# Patient Record
Sex: Male | Born: 1965
Health system: Southern US, Community
[De-identification: ages and names within clinical notes are randomized; demographics above are authoritative.]

## PROBLEM LIST (undated history)

## (undated) DIAGNOSIS — E119 Type 2 diabetes mellitus without complications: Secondary | ICD-10-CM

## (undated) DIAGNOSIS — J309 Allergic rhinitis, unspecified: Secondary | ICD-10-CM

## (undated) DIAGNOSIS — T7840XA Allergy, unspecified, initial encounter: Secondary | ICD-10-CM

## (undated) DIAGNOSIS — Z87442 Personal history of urinary calculi: Secondary | ICD-10-CM

## (undated) DIAGNOSIS — E785 Hyperlipidemia, unspecified: Secondary | ICD-10-CM

## (undated) DIAGNOSIS — M1990 Unspecified osteoarthritis, unspecified site: Secondary | ICD-10-CM

## (undated) HISTORY — DX: Allergic rhinitis, unspecified: J30.9

## (undated) HISTORY — PX: COLONOSCOPY: SHX174

## (undated) HISTORY — DX: Allergy, unspecified, initial encounter: T78.40XA

## (undated) HISTORY — PX: SHOULDER ARTHROSCOPY: SHX128

## (undated) HISTORY — DX: Hyperlipidemia, unspecified: E78.5

---

## 2003-02-06 ENCOUNTER — Emergency Department (HOSPITAL_COMMUNITY): Admission: EM | Admit: 2003-02-06 | Discharge: 2003-02-06 | Payer: Self-pay | Admitting: Emergency Medicine

## 2004-10-09 ENCOUNTER — Ambulatory Visit (HOSPITAL_COMMUNITY): Admission: RE | Admit: 2004-10-09 | Discharge: 2004-10-09 | Payer: Self-pay | Admitting: Family Medicine

## 2004-11-05 ENCOUNTER — Ambulatory Visit: Payer: Self-pay | Admitting: Orthopedic Surgery

## 2004-11-15 ENCOUNTER — Ambulatory Visit (HOSPITAL_COMMUNITY): Admission: RE | Admit: 2004-11-15 | Discharge: 2004-11-15 | Payer: Self-pay | Admitting: Orthopedic Surgery

## 2004-11-26 ENCOUNTER — Ambulatory Visit: Payer: Self-pay | Admitting: Orthopedic Surgery

## 2004-12-24 ENCOUNTER — Ambulatory Visit: Payer: Self-pay | Admitting: Orthopedic Surgery

## 2005-01-14 ENCOUNTER — Ambulatory Visit: Payer: Self-pay | Admitting: Orthopedic Surgery

## 2005-04-24 ENCOUNTER — Ambulatory Visit: Payer: Self-pay | Admitting: Orthopedic Surgery

## 2005-07-25 ENCOUNTER — Ambulatory Visit: Payer: Self-pay | Admitting: Orthopedic Surgery

## 2011-04-11 ENCOUNTER — Encounter: Payer: Self-pay | Admitting: Orthopedic Surgery

## 2011-04-11 ENCOUNTER — Ambulatory Visit (INDEPENDENT_AMBULATORY_CARE_PROVIDER_SITE_OTHER): Payer: BC Managed Care – PPO | Admitting: Orthopedic Surgery

## 2011-04-11 VITALS — BP 120/80 | Ht 74.0 in | Wt 254.0 lb

## 2011-04-11 DIAGNOSIS — M722 Plantar fascial fibromatosis: Secondary | ICD-10-CM

## 2011-04-11 MED ORDER — DICLOFENAC POTASSIUM 50 MG PO TABS: 50.0000 mg | ORAL_TABLET | Freq: Two times a day (BID) | ORAL | Status: DC

## 2011-04-11 NOTE — Progress Notes (Signed)
Chief complaint bilateral foot pain, LEFT greater than RIGHT.  Previous history of problems with his feet inserts.  That was over 5 years ago.  Now, has pain in the mid plantar fascia, not at the insertion. Complaint of sharp, dull, throbbing, burning, pain 6/10, which is constant, worse, after he's sat down and stood up. It's worse with standing and walking is better when he changes his shoes. He also has some pain along Baxter's nerve.  Review of systems weight gain, blurred vision, joint pain, and stiffness.  Past Surgical History  Procedure Date  . Shoulder arthroscopy     No past medical history on file.  Examination well-developed, well-nourished, male, grooming, and hygiene, are normal. He is oriented x3. His mood and affect are normal. His gait and station are unremarkable.  Bilateral foot. Examination reveals tenderness in the mid plantar fascial medial band, LEFT foot, fairly severe mild in the RIGHT foot. No pain at the insertion of either plantar fascia. Arches are normal. His Achilles tendon is nontender. No swelling in the bursa. Muscle tone normal in the foot and ankle. Skin intact. No rashes  Pulse and temperature are normal bilaterally. No sensory abnormalities.  LEFT foot along Baxter's nerve. There is some tenderness there as well.  Overall balance is normal.  X-rays LEFT foot.  Separate x-ray report.  Reason for x-ray for pain.  Findings. Contours are normal and there is a spur at the heel. There are no abnormalities seen. Otherwise.  Impression Normal foot exam.  Plan recommend anti-inflammatory and orthotics. New custom

## 2011-04-12 ENCOUNTER — Telehealth: Payer: Self-pay | Admitting: Orthopedic Surgery

## 2011-04-12 NOTE — Telephone Encounter (Signed)
Faxed order to [facility], ph # [phone], fax [phone], per Dr. Mort Sawyers recommendation, office visit 04/11/11. Patient will call them directly and has a copy of the order.

## 2011-04-17 ENCOUNTER — Other Ambulatory Visit: Payer: Self-pay | Admitting: *Deleted

## 2011-04-17 DIAGNOSIS — M722 Plantar fascial fibromatosis: Secondary | ICD-10-CM

## 2011-04-17 MED ORDER — DICLOFENAC POTASSIUM 50 MG PO TABS: 50.0000 mg | ORAL_TABLET | Freq: Two times a day (BID) | ORAL | Status: DC

## 2011-04-18 ENCOUNTER — Other Ambulatory Visit: Payer: Self-pay | Admitting: *Deleted

## 2011-04-18 ENCOUNTER — Telehealth: Payer: Self-pay | Admitting: Orthopedic Surgery

## 2011-04-18 MED ORDER — DICLOFENAC SODIUM 50 MG PO TBEC: 50.0000 mg | DELAYED_RELEASE_TABLET | Freq: Two times a day (BID) | ORAL | Status: AC

## 2011-04-18 NOTE — Telephone Encounter (Signed)
Call ask what is the issue

## 2011-04-18 NOTE — Telephone Encounter (Signed)
Med change was sent in

## 2011-04-18 NOTE — Telephone Encounter (Signed)
[name] needs you to call him about a prescription he received for Paul Barnes (Aug 31, 2065) His direct # [phone]

## 2011-04-18 NOTE — Telephone Encounter (Signed)
Mardelle Matte said the E scribe says Diclofenac 50 mg and in parentheses Cataflam 50 mg He asked if ok to use Voltaren

## 2012-10-06 ENCOUNTER — Other Ambulatory Visit: Payer: Self-pay | Admitting: Nurse Practitioner

## 2012-10-06 ENCOUNTER — Encounter: Payer: Self-pay | Admitting: *Deleted

## 2013-06-07 ENCOUNTER — Ambulatory Visit (INDEPENDENT_AMBULATORY_CARE_PROVIDER_SITE_OTHER): Payer: BC Managed Care – PPO | Admitting: Family Medicine

## 2013-06-07 ENCOUNTER — Encounter: Payer: Self-pay | Admitting: Family Medicine

## 2013-06-07 VITALS — BP 130/84 | Temp 101.1°F | Ht 74.0 in | Wt 258.0 lb

## 2013-06-07 DIAGNOSIS — J310 Chronic rhinitis: Secondary | ICD-10-CM

## 2013-06-07 DIAGNOSIS — J209 Acute bronchitis, unspecified: Secondary | ICD-10-CM

## 2013-06-07 DIAGNOSIS — J329 Chronic sinusitis, unspecified: Secondary | ICD-10-CM

## 2013-06-07 MED ORDER — ALBUTEROL SULFATE HFA 108 (90 BASE) MCG/ACT IN AERS: 2.0000 | INHALATION_SPRAY | Freq: Four times a day (QID) | RESPIRATORY_TRACT | Status: DC | PRN

## 2013-06-07 MED ORDER — LEVOFLOXACIN 500 MG PO TABS: 500.0000 mg | ORAL_TABLET | Freq: Every day | ORAL | Status: AC

## 2013-06-07 NOTE — Progress Notes (Signed)
   Subjective:    Patient ID: Paul Barnes, male    DOB: Mar 25, 1966, 48 y.o.   MRN: [id number]  Fever  This is a new problem. The current episode started in the past 7 days. The problem occurs constantly. The problem has been unchanged. The maximum temperature noted was 101 to 101.9 F. The temperature was taken using an oral thermometer. Associated symptoms include coughing, sleepiness and a sore throat. Treatments tried: sudafed. The treatment provided no relief.   Hx of possible flu in nov  Sore throat at times,  Cough deep and achey with everything  Headache mild in nature, Diminished energy  Ears sore scalp tingly   Review of Systems  Constitutional: Positive for fever.  HENT: Positive for sore throat.   Respiratory: Positive for cough.    no vomiting no diarrhea no rash ROS otherwise negative     Objective:   Physical Exam  Temp 101 moderate malaise. HEENT moderate nasal congestion frontal tenderness pharynx normal neck supple. Lungs clear heart regular in rhythm. Wheezy cough during exam.      Assessment & Plan:  Impression rhinosinusitis with fever possible flu element plan Levaquin daily 10 days. Symptomatic care discussed. Albuterol when necessary for wheezes and reactive airways. Symptomatic care discussed in this regard also. WSL

## 2013-06-08 ENCOUNTER — Telehealth: Payer: Self-pay | Admitting: Family Medicine

## 2013-06-08 MED ORDER — BENZONATATE 100 MG PO CAPS: 100.0000 mg | ORAL_CAPSULE | Freq: Four times a day (QID) | ORAL | Status: DC | PRN

## 2013-06-08 NOTE — Telephone Encounter (Signed)
Patient says that his cough is keeping him up at night and would like something for this called in to [facility]

## 2013-06-08 NOTE — Telephone Encounter (Signed)
Med sent and patient notified

## 2013-06-08 NOTE — Telephone Encounter (Signed)
Paul Barnes one q 6 prn cough

## 2013-06-11 ENCOUNTER — Telehealth: Payer: Self-pay | Admitting: Family Medicine

## 2013-06-11 NOTE — Telephone Encounter (Signed)
Notified patient it sounds like pt has FLU and sinus. Levaquin a good medicine. FLU will often make people feel horrible with resp congestion for 10 plus days. If wheezing sob or worse then may need to be seen here or er. Patient verbalized understanding.

## 2013-06-11 NOTE — Telephone Encounter (Signed)
Patient is still having cough, no fever, feeling clammy, congestion in chest, fatigue. He is wanting to know if he needs a different medication because he is not getting better.   [place]

## 2013-06-11 NOTE — Telephone Encounter (Signed)
It sounds like pt has FLU and sinus. Levaquin a good medicine. FLU will often make people feel horrible with resp congestion for 10 plus days. If wheezing sob or worse then may need to be seen here or er. Not improving form 1/5 not unusual

## 2013-06-23 DIAGNOSIS — Z0289 Encounter for other administrative examinations: Secondary | ICD-10-CM

## 2013-07-05 ENCOUNTER — Telehealth: Payer: Self-pay | Admitting: Family Medicine

## 2013-07-05 NOTE — Telephone Encounter (Signed)
Patient called on 1/31 about his disability papers that need faxing by the 4th of the month.He wanted to know if they are ready yet.

## 2013-07-05 NOTE — Telephone Encounter (Signed)
i did and gave to erica this morn

## 2013-09-15 ENCOUNTER — Encounter: Payer: Self-pay | Admitting: Family Medicine

## 2013-09-15 ENCOUNTER — Encounter: Payer: BC Managed Care – PPO | Admitting: Family Medicine

## 2013-09-17 ENCOUNTER — Encounter: Payer: Self-pay | Admitting: Family Medicine

## 2013-09-17 ENCOUNTER — Ambulatory Visit (INDEPENDENT_AMBULATORY_CARE_PROVIDER_SITE_OTHER): Payer: BC Managed Care – PPO | Admitting: Family Medicine

## 2013-09-17 VITALS — BP 118/80 | Temp 98.6°F | Ht 74.0 in | Wt 246.0 lb

## 2013-09-17 DIAGNOSIS — E119 Type 2 diabetes mellitus without complications: Secondary | ICD-10-CM

## 2013-09-17 DIAGNOSIS — N399 Disorder of urinary system, unspecified: Secondary | ICD-10-CM

## 2013-09-17 DIAGNOSIS — M545 Low back pain, unspecified: Secondary | ICD-10-CM

## 2013-09-17 DIAGNOSIS — R3989 Other symptoms and signs involving the genitourinary system: Secondary | ICD-10-CM

## 2013-09-17 LAB — POCT GLYCOSYLATED HEMOGLOBIN (HGB A1C): HEMOGLOBIN A1C: 12

## 2013-09-17 LAB — POCT URINALYSIS DIPSTICK
Glucose, UA: >1000
Spec Grav, UA: 1.025
pH, UA: 5

## 2013-09-17 LAB — GLUCOSE, POCT (MANUAL RESULT ENTRY): POC Glucose: 296 mg/dl — AB (ref 70–99)

## 2013-09-17 MED ORDER — ETODOLAC 400 MG PO TABS: 400.0000 mg | ORAL_TABLET | Freq: Two times a day (BID) | ORAL | Status: DC

## 2013-09-17 MED ORDER — METFORMIN HCL 500 MG PO TABS: ORAL_TABLET | ORAL | Status: DC

## 2013-09-17 NOTE — Progress Notes (Signed)
   Subjective:    Patient ID: Paul Barnes, male    DOB: 31-Jan-1966, 48 y.o.   MRN: [id number]  HPI Patient has been having lower back pain since Monday, on his right side.  No matter what position he is in, it is uncomfortable.  His fasting glucose is 296.  A1C is 12.0     Review of Systems     Objective:   Physical Exam        Assessment & Plan:

## 2013-09-17 NOTE — Progress Notes (Signed)
Subjective:    Patient ID: Paul Barnes, male    DOB: 18-Oct-1965, 48 y.o.   MRN: [id number]  HPI  Results for orders placed in visit on 09/17/13  POCT URINALYSIS DIPSTICK      Result Value Ref Range   Color, UA       Clarity, UA       Glucose, UA >1000     Bilirubin, UA ++     Ketones, UA       Spec Grav, UA 1.025     Blood, UA       pH, UA 5.0     Protein, UA       Urobilinogen, UA       Nitrite, UA       Leukocytes, UA       Results for orders placed in visit on 09/17/13  POCT URINALYSIS DIPSTICK      Result Value Ref Range   Color, UA       Clarity, UA       Glucose, UA >1000     Bilirubin, UA ++     Ketones, UA       Spec Grav, UA 1.025     Blood, UA       pH, UA 5.0     Protein, UA       Urobilinogen, UA       Nitrite, UA       Leukocytes, UA      GLUCOSE, POCT (MANUAL RESULT ENTRY)      Result Value Ref Range   POC Glucose 296 (*) 70 - 99 mg/dl  POCT GLYCOSYLATED HEMOGLOBIN (HGB A1C)      Result Value Ref Range   Hemoglobin A1C 12.0      mon morn got hit with a bad pain. At first he thought it might be a kidney stone. Primarily the right flank. Was worse with motion. Went to urgent care. Was noted to have fairly severe pain. They didn't urinalysis. He started him on Cipro for possible prostate infection. Of note there were more concerned that he had considerable amount of sugar in his urine. He was encouraged to get back with [place] ASAP.  Patient reports increased to sugar in diet lately. Increased urination. Increased thirst. Some nocturia. No particular dysuria.  Working hard not exercising as much as he had hoped.  Does admit to increased sugar and drinks and in diet.  Pain got worse and worse, and now somewhat better. No particular change with motion. No history of true pyelonephritis  Unable to reslve pain with over-the-counter agents. Review of Systems No nausea no vomiting no chest pain no abdominal pain no change in bowel habits some blurred  vision at times. ROS otherwise negative    Objective:   Physical Exam  Alert vitals stable. HEENT normal. Lungs clear. Heart regular rate and rhythm. Ankles without edema.  Possible right CVA tenderness minimal no spinal tenderness. Abdomen benign.    Results for orders placed in visit on 09/17/13  POCT URINALYSIS DIPSTICK      Result Value Ref Range   Color, UA       Clarity, UA       Glucose, UA >1000     Bilirubin, UA ++     Ketones, UA       Spec Grav, UA 1.025     Blood, UA       pH, UA 5.0     Protein, UA  Urobilinogen, UA       Nitrite, UA       Leukocytes, UA      GLUCOSE, POCT (MANUAL RESULT ENTRY)      Result Value Ref Range   POC Glucose 296 (*) 70 - 99 mg/dl  POCT GLYCOSYLATED HEMOGLOBIN (HGB A1C)      Result Value Ref Range   Hemoglobin A1C 12.0      Assessment & Plan:  Impression 1 new onset type 2 diabetes discussed at great length. Many questions answered. Prognosis management treatment diet exercise long-term complications implications etc. all discussed at great length. #2 flank pain possible pyelonephritis of gout. Plan maintain Cipro. Add Lodine when necessary for pain. Symptomatic care discussed. Diet discussed. Glucometer prescribed. Patient to start checking sugars once each day. Metformin 500 mg 1 by mouth twice a day for 2 days then 2 by mouth twice a day. Educational sheet for hospital given. Recheck in several weeks. 35-40 minutes spent most in discussion. Followup as scheduled. WSL

## 2013-09-18 DIAGNOSIS — E119 Type 2 diabetes mellitus without complications: Secondary | ICD-10-CM | POA: Insufficient documentation

## 2013-09-27 ENCOUNTER — Telehealth: Payer: Self-pay | Admitting: Family Medicine

## 2013-09-27 NOTE — Telephone Encounter (Signed)
Patient was seen on 09/17/13 and put on metformin. He is having side effects. He is feeling fatigued, dizzy and vision getting worse. Please advise. Also, blood sugar is down to 104 today.

## 2013-09-27 NOTE — Telephone Encounter (Signed)
Patient notified and verbalized understanding. 

## 2013-09-27 NOTE — Telephone Encounter (Signed)
Vision is getting worse because sugar is getting better, this is temporary. Knock back down to just one tab twice per d, work hard on diet, f u as sched

## 2013-09-30 DIAGNOSIS — Z0289 Encounter for other administrative examinations: Secondary | ICD-10-CM

## 2013-10-06 ENCOUNTER — Telehealth: Payer: Self-pay | Admitting: Family Medicine

## 2013-10-06 NOTE — Telephone Encounter (Signed)
Patient called to check on FMLA to see if ready, I explain not in office until Monday. He wanted you to add reocurring problem to his form.I wasn't sure where to right that. Please review form especially question 9 and 10 I wasn't sure what to write for them.

## 2013-10-11 ENCOUNTER — Ambulatory Visit: Payer: BC Managed Care – PPO | Admitting: Family Medicine

## 2013-10-18 ENCOUNTER — Encounter: Payer: Self-pay | Admitting: Family Medicine

## 2013-10-18 ENCOUNTER — Ambulatory Visit (INDEPENDENT_AMBULATORY_CARE_PROVIDER_SITE_OTHER): Payer: BC Managed Care – PPO | Admitting: Family Medicine

## 2013-10-18 VITALS — BP 118/82 | Ht 74.0 in | Wt 241.0 lb

## 2013-10-18 DIAGNOSIS — E119 Type 2 diabetes mellitus without complications: Secondary | ICD-10-CM

## 2013-10-18 NOTE — Progress Notes (Signed)
   Subjective:    Patient ID: Paul Barnes, male    DOB: 19-May-1966, 48 y.o.   MRN: [id number]  Diabetes He presents for his follow-up diabetic visit. He has type 2 diabetes mellitus. There are no hypoglycemic associated symptoms. Associated symptoms include blurred vision. Current diabetic treatment includes oral agent (monotherapy). He is compliant with treatment all of the time. He is following a generally healthy diet. Meal planning includes avoidance of concentrated sweets. He participates in exercise intermittently. He monitors blood glucose at home 1-2 x per day. His breakfast blood glucose range is generally 70-90 mg/dl. His overall blood glucose range is 110-130 mg/dl.    Pt would like an ongoing FMLA papers so that he can come for f/u visits and not get penalized at his job.   Back is still a little sore, but not painful like it was. Doing some walking. Had a big auction  Results for orders placed in visit on 09/17/13  POCT URINALYSIS DIPSTICK      Result Value Ref Range   Color, UA       Clarity, UA       Glucose, UA >1000     Bilirubin, UA ++     Ketones, UA       Spec Grav, UA 1.025     Blood, UA       pH, UA 5.0     Protein, UA       Urobilinogen, UA       Nitrite, UA       Leukocytes, UA      GLUCOSE, POCT (MANUAL RESULT ENTRY)      Result Value Ref Range   POC Glucose 296 (*) 70 - 99 mg/dl  POCT GLYCOSYLATED HEMOGLOBIN (HGB A1C)      Result Value Ref Range   Hemoglobin A1C 12.0     Most fasting glu's 70 to 80  Vision was transiently worse, used glasses for first time  Review of Systems  Eyes: Positive for blurred vision.   no chest pain. Back pain improving.     Objective:   Physical Exam  Alert no apparent distress. H&T normal. Lungs clear. Heart regular in rhythm. Ankles without edema.      Assessment & Plan:  Impression 1 type 2 diabetes control improving. Discussed. Plan followup in 3 months. Warning signs discussed. Blood work then. If stable  and improving we'll back off to every 6 months at that point. WSL

## 2013-11-03 ENCOUNTER — Telehealth: Payer: Self-pay | Admitting: Family Medicine

## 2013-11-03 NOTE — Telephone Encounter (Signed)
Patient was seen 5/18 for a visit and he called on 6/2 stating his job denied him coverage for that day Stating they need more detail on how many days per month that covers office visit on his followups and flareups.What you put on his form was not enough.Patient wants to know if you can put something in writing to cover this.

## 2013-11-03 NOTE — Telephone Encounter (Signed)
please advise pt it is not my fault that his company is being unreasonable, some companies are enormously resistant to providing time off for patients with chronic health concerns and his compoany clearly is one. Tell him that. Get me copy of FMLA and I will dictate a ltter to attach to it,

## 2013-11-16 ENCOUNTER — Ambulatory Visit (INDEPENDENT_AMBULATORY_CARE_PROVIDER_SITE_OTHER): Payer: BC Managed Care – PPO | Admitting: Family Medicine

## 2013-11-16 ENCOUNTER — Encounter: Payer: Self-pay | Admitting: Family Medicine

## 2013-11-16 VITALS — BP 138/90 | Ht 74.0 in | Wt 239.0 lb

## 2013-11-16 DIAGNOSIS — E119 Type 2 diabetes mellitus without complications: Secondary | ICD-10-CM

## 2013-11-16 NOTE — Progress Notes (Signed)
   Subjective:    Patient ID: Paul Barnes, male    DOB: Aug 27, 1965, 48 y.o.   MRN: [id number]  HPI Patient is here today to talk to you about his FMLA documents. Patient is working harder on his diet. He also reports compliance with medication.  Patient reports his vision has improved. Also less frequent urination.  No obvious side effects with medications.  No sugars in the low 100s.  No concerns/questions    Review of Systems No headache no chest pain no back pain no abdominal pain ROS otherwise negative    Objective:   Physical Exam   alert no apparent distress HEENT normal. Lungs clear. Heart regular in rhythm. Ankles without edema.      Assessment & Plan:  Impression type 2 diabetes control improved plan diet discussed. Compliance discussed exercise discussed. Recheck as scheduled. WSL we'll do A1c then

## 2013-11-17 ENCOUNTER — Encounter: Payer: Self-pay | Admitting: Family Medicine

## 2014-01-06 ENCOUNTER — Encounter: Payer: Self-pay | Admitting: Orthopedic Surgery

## 2014-01-06 ENCOUNTER — Ambulatory Visit (INDEPENDENT_AMBULATORY_CARE_PROVIDER_SITE_OTHER): Payer: BC Managed Care – PPO

## 2014-01-06 ENCOUNTER — Ambulatory Visit (INDEPENDENT_AMBULATORY_CARE_PROVIDER_SITE_OTHER): Payer: BC Managed Care – PPO | Admitting: Orthopedic Surgery

## 2014-01-06 VITALS — BP 138/88 | Ht 74.0 in | Wt 239.0 lb

## 2014-01-06 DIAGNOSIS — M25571 Pain in right ankle and joints of right foot: Secondary | ICD-10-CM

## 2014-01-06 DIAGNOSIS — M722 Plantar fascial fibromatosis: Secondary | ICD-10-CM

## 2014-01-06 DIAGNOSIS — M25579 Pain in unspecified ankle and joints of unspecified foot: Secondary | ICD-10-CM

## 2014-01-06 NOTE — Addendum Note (Signed)
Addended by: Carole Civil on: 01/06/2014 03:23 PM   Modules accepted: Level of Service

## 2014-01-06 NOTE — Progress Notes (Signed)
Chief Complaint  Patient presents with  . Foot Pain    Right foot pain, no injury.    48 years old recurrent pain right heel. The patient has had plantar fasciitis in the past was treated previously so we went ahead and started his exercise and anti-inflammatory program including cryotherapy presents with plantar heel pain no trauma or breath also complains of medial knee pain which we will address at another visit seems to have discomfort when his foot hits something in causes his leg or knee to twist  Review of systems otherwise normal  Past Medical History  Diagnosis Date  . Allergic rhinitis    Past Surgical History  Procedure Laterality Date  . Shoulder arthroscopy       Vital signs are stable as recorded  General appearance is normal, body habitus normal  The patient is alert and oriented x 3  The patient's mood and affect are normal  Gait assessment: Normal  The cardiovascular exam reveals normal pulses and temperature without edema or  swelling.  The lymphatic system is negative for palpable lymph nodes  The sensory exam is normal.  There are no pathologic reflexes.  Balance is normal.   Exam of the right foot dorsiflexion is normal Achilles tendon nontender tenderness at the plantar fascial insertion knee is stable muscle tone is normal no atrophy skin normal no rash    A/P X-rays normal structural bones in the foot with 2 spurs one on the Achilles insertion one at the plantar fascial insertion  Plantar fasciitis right foot  Inject right foot continue exercises ice and anti-inflammatory  Plantar Fasciitis (Heel Spur Syndrome)  Procedure injection plantar fascia  Verbal consent was obtained   Time out completed   The right  foot was injected  Under sterile conditions the plantar fascia  was injected with Depomedrol 40 mg / ml (1 ml) and lidocaine 1% (4 ml)  There were no complications

## 2014-01-06 NOTE — Patient Instructions (Signed)
You have received a steroid shot. 15% of patients experience increased pain at the injection site with in the next 24 hours. This is best treated with ice and tylenol extra strength 2 tabs every 8 hours. If you are still having pain please call the office.   Plantar Fasciitis Plantar fasciitis is a common condition that causes foot pain. It is soreness (inflammation) of the band of tough fibrous tissue on the bottom of the foot that runs from the heel bone (calcaneus) to the ball of the foot. The cause of this soreness may be from excessive standing, poor fitting shoes, running on hard surfaces, being overweight, having an abnormal walk, or overuse (this is common in runners) of the painful foot or feet. It is also common in aerobic exercise dancers and ballet dancers. SYMPTOMS  Most people with plantar fasciitis complain of:  Severe pain in the morning on the bottom of their foot especially when taking the first steps out of bed. This pain recedes after a few minutes of walking.  Severe pain is experienced also during walking following a long period of inactivity.  Pain is worse when walking barefoot or up stairs DIAGNOSIS   Your caregiver will diagnose this condition by examining and feeling your foot.  Special tests such as X-rays of your foot, are usually not needed. PREVENTION   Consult a sports medicine professional before beginning a new exercise program.  Walking programs offer a good workout. With walking there is a lower chance of overuse injuries common to runners. There is less impact and less jarring of the joints.  Begin all new exercise programs slowly. If problems or pain develop, decrease the amount of time or distance until you are at a comfortable level.  Wear good shoes and replace them regularly.  Stretch your foot and the heel cords at the back of the ankle (Achilles tendon) both before and after exercise.  Run or exercise on even surfaces that are not hard. For  example, asphalt is better than pavement.  Do not run barefoot on hard surfaces.  If using a treadmill, vary the incline.  Do not continue to workout if you have foot or joint problems. Seek professional help if they do not improve. HOME CARE INSTRUCTIONS   Avoid activities that cause you pain until you recover.  Use ice or cold packs on the problem or painful areas after working out.  Only take over-the-counter or prescription medicines for pain, discomfort, or fever as directed by your caregiver.  Soft shoe inserts or athletic shoes with air or gel sole cushions may be helpful.  If problems continue or become more severe, consult a sports medicine caregiver or your own health care provider. Cortisone is a potent anti-inflammatory medication that may be injected into the painful area. You can discuss this treatment with your caregiver. MAKE SURE YOU:   Understand these instructions.  Will watch your condition.  Will get help right away if you are not doing well or get worse. Document Released: 02/12/2001 Document Revised: 08/12/2011 Document Reviewed: 04/13/2008 [facility] Patient Information 2015 [place], [place]. This information is not intended to replace advice given to you by your health care provider. Make sure you discuss any questions you have with your health care provider.

## 2014-01-18 ENCOUNTER — Encounter: Payer: Self-pay | Admitting: Family Medicine

## 2014-01-18 ENCOUNTER — Ambulatory Visit (INDEPENDENT_AMBULATORY_CARE_PROVIDER_SITE_OTHER): Payer: BC Managed Care – PPO | Admitting: Family Medicine

## 2014-01-18 VITALS — BP 120/80 | Ht 75.0 in | Wt 238.0 lb

## 2014-01-18 DIAGNOSIS — Z0189 Encounter for other specified special examinations: Secondary | ICD-10-CM

## 2014-01-18 DIAGNOSIS — E119 Type 2 diabetes mellitus without complications: Secondary | ICD-10-CM

## 2014-01-18 DIAGNOSIS — M722 Plantar fascial fibromatosis: Secondary | ICD-10-CM | POA: Insufficient documentation

## 2014-01-18 DIAGNOSIS — Z79899 Other long term (current) drug therapy: Secondary | ICD-10-CM

## 2014-01-18 LAB — POCT GLYCOSYLATED HEMOGLOBIN (HGB A1C): HEMOGLOBIN A1C: 6.1

## 2014-01-18 MED ORDER — METFORMIN HCL 500 MG PO TABS: 500.0000 mg | ORAL_TABLET | Freq: Two times a day (BID) | ORAL | Status: DC

## 2014-01-18 NOTE — Progress Notes (Signed)
   Subjective:    Patient ID: Paul Barnes, male    DOB: Dec 14, 1965, 48 y.o.   MRN: [id number]  HPI Patient is learning to adjust his lifestyle change since begin diagnosed with diabetes. Patient states he is pleased with his #'s when checking blood sugar.  Patient still notes difficulty with blurred vision. However his sugars have been in tight control of late.  Exercising some not as much as he hoped.  Watching his diet well.  Ongoing challenges with heel pain. Left heel. Had a steroid injection. Helped a bit but not a lot. Due to see Dr. Aline Brochure back again soon.   Results for orders placed in visit on 01/18/14  POCT GLYCOSYLATED HEMOGLOBIN (HGB A1C)      Result Value Ref Range   Hemoglobin A1C 6.1        Morning numbers are generally good Review of Systems No chest pain no back pain no abdominal pain no change about the blood in stool ROS otherwise negative    Objective:   Physical Exam Alert no apparent distress vitals stable HEENT normal. Lungs clear. Heart rare rhythm. Ankles without edema. Feet C. diabetic foot exam Left heel tenderness palpation      Assessment & Plan:  Impression 1 type 2 diabetes control better and improved discussed at length #2 plantar fasciitis l tenderness palpation. Ongoing fasciitis discuss. Encouraged to get back with workup #3 history hyperlipidemia need to check blood work discussed plan diet exercise discussed maintain same meds check in 6 months encouraged to see eye Dr. 25 minutes spent most in discussion. WSL

## 2014-01-20 ENCOUNTER — Encounter (HOSPITAL_COMMUNITY): Payer: Self-pay | Admitting: Dietician

## 2014-01-20 NOTE — Progress Notes (Signed)
Paul Barnes left a voicemail on 01/18/14 to inquire about group DM classes. Returned call on 01/19/14 and left a voicemail; informed that services are offered on a limited basis due to transition of services being transferred to [place]. Offered 01/20/14 class at 1730 and informed that no additional classes will be held for August. Requested return call for registration, however never received a return call.

## 2014-02-01 ENCOUNTER — Telehealth: Payer: Self-pay | Admitting: Orthopedic Surgery

## 2014-02-01 ENCOUNTER — Ambulatory Visit (INDEPENDENT_AMBULATORY_CARE_PROVIDER_SITE_OTHER): Payer: BC Managed Care – PPO | Admitting: Orthopedic Surgery

## 2014-02-01 ENCOUNTER — Ambulatory Visit (INDEPENDENT_AMBULATORY_CARE_PROVIDER_SITE_OTHER): Payer: BC Managed Care – PPO

## 2014-02-01 ENCOUNTER — Encounter: Payer: Self-pay | Admitting: Orthopedic Surgery

## 2014-02-01 VITALS — BP 133/84 | Ht 75.0 in | Wt 238.0 lb

## 2014-02-01 DIAGNOSIS — M25569 Pain in unspecified knee: Secondary | ICD-10-CM

## 2014-02-01 DIAGNOSIS — M23321 Other meniscus derangements, posterior horn of medial meniscus, right knee: Secondary | ICD-10-CM

## 2014-02-01 DIAGNOSIS — M25561 Pain in right knee: Secondary | ICD-10-CM

## 2014-02-01 DIAGNOSIS — M23329 Other meniscus derangements, posterior horn of medial meniscus, unspecified knee: Secondary | ICD-10-CM

## 2014-02-01 NOTE — Telephone Encounter (Signed)
Patient called back about surgery, as discussed with Dr Aline Brochure at today's office visit; asked to have nurse return his call.  Phone#[phone] (Home)

## 2014-02-01 NOTE — Patient Instructions (Signed)
Call us back when ready to schedule

## 2014-02-01 NOTE — Telephone Encounter (Signed)
Decided on surgery for next Friday, can you write him a work note to be out all next week

## 2014-02-02 ENCOUNTER — Telehealth: Payer: Self-pay | Admitting: Orthopedic Surgery

## 2014-02-02 ENCOUNTER — Encounter (HOSPITAL_COMMUNITY): Payer: Self-pay | Admitting: Pharmacy Technician

## 2014-02-02 ENCOUNTER — Other Ambulatory Visit: Payer: Self-pay | Admitting: *Deleted

## 2014-02-02 NOTE — Telephone Encounter (Signed)
yes

## 2014-02-02 NOTE — Telephone Encounter (Signed)
Regarding out-patient surgery scheduled at [facility], 02/11/14, CPT 29881, contacted insurer, [employer], [phone]; per automated voice response system,  No pre-authorization is required. Ref# [id number], 02/02/14, 4:27p.m.

## 2014-02-03 ENCOUNTER — Encounter: Payer: Self-pay | Admitting: Orthopedic Surgery

## 2014-02-03 DIAGNOSIS — M23329 Other meniscus derangements, posterior horn of medial meniscus, unspecified knee: Secondary | ICD-10-CM | POA: Insufficient documentation

## 2014-02-03 NOTE — Progress Notes (Signed)
Chief Complaint  Patient presents with  . Knee Pain    right knee pain, no known injury   Established patient new problem  48 year old male presents with atraumatic onset of medial knee pain which she's had for a couple of months now. His pain is unrelieved by over-the-counter medication such as ibuprofen Aleve. Activity modification does not seem to make any improvement. He noticed some swelling in his knee as well. With certain motions such as twisting squatting kneeling bending the knee will give way.  Review of systems normal  Physical exam BP 133/84  Ht 6\' 3"  (1.905 m)  Wt 238 lb (107.956 kg)  BMI 29.75 kg/m2  Vital signs are stable as recorded  General appearance is normal, body habitus normal  The patient is alert and oriented x 3  The patient's mood and affect are normal  Gait assessment: There is no limp at this time lThe cardiovascular exam reveals normal pulses and temperature without edema or  swelling.  The lymphatic system is negative for palpable lymph nodes  The sensory exam is normal.  There are no pathologic reflexes.  Balance is normal.   left knee normal range of motion stability strength skin without rash lesion or laceration   Exam of the right knee  Inspection tenderness over the medial compartment small joint effusion Range of motion full but painful range of motion Stability all ligament stable Strength grade 5 motor strength  Skin normal, no rash, or laceration. Provocative tests McMurray sign, Apley sign and screw home test positive for meniscal tear  A/P X-rays negative  Presumptive diagnosis torn medial meniscus right knee.  Recommend surgical arthroscopy at the patient's convenience. Risks and benefits of procedure explained to the patient.  Patient also has plantar fasciitis and he will be out of work so hopefully the rest will help his feet. He's already received injection and orthotics.

## 2014-02-03 NOTE — Telephone Encounter (Signed)
Please advise and give work note

## 2014-02-03 NOTE — Telephone Encounter (Signed)
Work note done, notified patient ready for pickup.

## 2014-02-04 NOTE — Patient Instructions (Signed)
Paul Barnes  02/04/2014   Your procedure is scheduled on:  02/11/14  Report to [facility] at 6:15 AM.  Call this number if you have problems the morning of surgery: [phone]   Remember:   Do not eat food or drink liquids after midnight.   Take these medicines the morning of surgery with A SIP OF WATER: Flonase  Albuterol - DO NOT TAKE METFORMIN   Do not wear jewelry, make-up or nail polish.  Do not wear lotions, powders, or perfumes. You may wear deodorant.  Do not shave 48 hours prior to surgery. Men may shave face and neck.  Do not bring valuables to the hospital.  [facility] is not responsible                  for any belongings or valuables.               Contacts, dentures or bridgework may not be worn into surgery.  Leave suitcase in the car. After surgery it may be brought to your room.  For patients admitted to the hospital, discharge time is determined by your treatment team.               Patients discharged the day of surgery will not be allowed to drive home.  Name and phone number of your driver:   Special Instructions: Shower using CHG 2 nights before surgery and the night before surgery.  If you shower the day of surgery use CHG.  Use special wash - you have one bottle of CHG for all showers.  You should use approximately 1/3 of the bottle for each shower.   Please read over the following fact sheets that you were given: Surgical Site Infection Prevention and Anesthesia Post-op Instructions   PATIENT INSTRUCTIONS POST-ANESTHESIA  IMMEDIATELY FOLLOWING SURGERY:  Do not drive or operate machinery for the first twenty four hours after surgery.  Do not make any important decisions for twenty four hours after surgery or while taking narcotic pain medications or sedatives.  If you develop intractable nausea and vomiting or a severe headache please notify your doctor immediately.  FOLLOW-UP:  Please make an appointment with your surgeon as instructed. You do not need  to follow up with anesthesia unless specifically instructed to do so.  WOUND CARE INSTRUCTIONS (if applicable):  Keep a dry clean dressing on the anesthesia/puncture wound site if there is drainage.  Once the wound has quit draining you may leave it open to air.  Generally you should leave the bandage intact for twenty four hours unless there is drainage.  If the epidural site drains for more than 36-48 hours please call the anesthesia department.  QUESTIONS?:  Please feel free to call your physician or the hospital operator if you have any questions, and they will be happy to assist you.      Arthroscopic Procedure, Knee An arthroscopic procedure can find what is wrong with your knee. PROCEDURE Arthroscopy is a surgical technique that allows your orthopedic surgeon to diagnose and treat your knee injury with accuracy. They will look into your knee through a small instrument. This is almost like a small (pencil sized) telescope. Because arthroscopy affects your knee less than open knee surgery, you can anticipate a more rapid recovery. Taking an active role by following your caregiver's instructions will help with rapid and complete recovery. Use crutches, rest, elevation, ice, and knee exercises as instructed. The length of recovery depends on various factors including  type of injury, age, physical condition, medical conditions, and your rehabilitation. Your knee is the joint between the large bones (femur and tibia) in your leg. Cartilage covers these bone ends which are smooth and slippery and allow your knee to bend and move smoothly. Two menisci, thick, semi-lunar shaped pads of cartilage which form a rim inside the joint, help absorb shock and stabilize your knee. Ligaments bind the bones together and support your knee joint. Muscles move the joint, help support your knee, and take stress off the joint itself. Because of this all programs and physical therapy to rehabilitate an injured or repaired  knee require rebuilding and strengthening your muscles. AFTER THE PROCEDURE  After the procedure, you will be moved to a recovery area until most of the effects of the medication have worn off. Your caregiver will discuss the test results with you.  Only take over-the-counter or prescription medicines for pain, discomfort, or fever as directed by your caregiver. SEEK MEDICAL CARE IF:   You have increased bleeding from your wounds.  You see redness, swelling, or have increasing pain in your wounds.  You have pus coming from your wound.  You have an oral temperature above 102 F (38.9 C).  You notice a bad smell coming from the wound or dressing.  You have severe pain with any motion of your knee. SEEK IMMEDIATE MEDICAL CARE IF:   You develop a rash.  You have difficulty breathing.  You have any allergic problems. Document Released: 05/17/2000 Document Revised: 08/12/2011 Document Reviewed: 12/09/2007 [facility] Patient Information 2015 [place], [place]. This information is not intended to replace advice given to you by your health care provider. Make sure you discuss any questions you have with your health care provider.

## 2014-02-08 ENCOUNTER — Encounter (HOSPITAL_COMMUNITY)
Admission: RE | Admit: 2014-02-08 | Discharge: 2014-02-08 | Disposition: A | Discharge status: Home / Self Care | Payer: BC Managed Care – PPO | Source: Ambulatory Visit | Attending: Orthopedic Surgery | Admitting: Orthopedic Surgery

## 2014-02-08 ENCOUNTER — Encounter (HOSPITAL_COMMUNITY): Payer: Self-pay

## 2014-02-08 DIAGNOSIS — M2240 Chondromalacia patellae, unspecified knee: Secondary | ICD-10-CM | POA: Diagnosis not present

## 2014-02-08 DIAGNOSIS — E119 Type 2 diabetes mellitus without complications: Secondary | ICD-10-CM | POA: Diagnosis not present

## 2014-02-08 DIAGNOSIS — M23329 Other meniscus derangements, posterior horn of medial meniscus, unspecified knee: Secondary | ICD-10-CM | POA: Diagnosis present

## 2014-02-08 DIAGNOSIS — M1710 Unilateral primary osteoarthritis, unspecified knee: Secondary | ICD-10-CM | POA: Diagnosis not present

## 2014-02-08 HISTORY — DX: Type 2 diabetes mellitus without complications: E11.9

## 2014-02-08 LAB — COMPREHENSIVE METABOLIC PANEL
ALT: 13 U/L (ref 0–53)
ANION GAP: 10 (ref 5–15)
AST: 16 U/L (ref 0–37)
Albumin: 4.2 g/dL (ref 3.5–5.2)
Alkaline Phosphatase: 70 U/L (ref 39–117)
BUN: 17 mg/dL (ref 6–23)
CHLORIDE: 104 meq/L (ref 96–112)
CO2: 27 mEq/L (ref 19–32)
CREATININE: 1.17 mg/dL (ref 0.50–1.35)
Calcium: 9.7 mg/dL (ref 8.4–10.5)
GFR, EST AFRICAN AMERICAN: 83 mL/min — AB (ref >90)
GFR, EST NON AFRICAN AMERICAN: 72 mL/min — AB (ref >90)
GLUCOSE: 145 mg/dL — AB (ref 70–99)
Potassium: 4.2 mEq/L (ref 3.7–5.3)
SODIUM: 141 meq/L (ref 137–147)
Total Bilirubin: 1.1 mg/dL (ref 0.3–1.2)
Total Protein: 7 g/dL (ref 6.0–8.3)

## 2014-02-08 LAB — CBC
HEMATOCRIT: 44.5 % (ref 39.0–52.0)
HEMOGLOBIN: 15.6 g/dL (ref 13.0–17.0)
MCH: 31.9 pg (ref 26.0–34.0)
MCHC: 35.1 g/dL (ref 30.0–36.0)
MCV: 91 fL (ref 78.0–100.0)
Platelets: 214 10*3/uL (ref 150–400)
RBC: 4.89 MIL/uL (ref 4.22–5.81)
RDW: 12.5 % (ref 11.5–15.5)
WBC: 5.6 10*3/uL (ref 4.0–10.5)

## 2014-02-08 NOTE — Pre-Procedure Instructions (Signed)
Patient given information to sign up for my chart at home. 

## 2014-02-10 NOTE — H&P (Signed)
Paul Barnes is an 48 y.o. male.   Chief Complaint   Patient presents with   .  Knee Pain       right knee pain, no known injury    Established patient new problem  48 year old male presents with atraumatic onset of medial knee pain which she's had for a couple of months now. His pain is unrelieved by over-the-counter medication such as ibuprofen Aleve. Activity modification does not seem to make any improvement. He noticed some swelling in his knee as well. With certain motions such as twisting squatting kneeling bending the knee will give way.  Review of systems normal  Physical exam BP 133/84  Ht 6\' 3"  (1.905 m)  Wt 238 lb (107.956 kg)  BMI 29.75 kg/m2  Vital signs are stable as recorded  General appearance is normal, body habitus normal  The patient is alert and oriented x 3  The patient's mood and affect are normal  Gait assessment: There is no limp at this time lThe cardiovascular exam reveals normal pulses and temperature without edema or  swelling.  The lymphatic system is negative for palpable lymph nodes  The sensory exam is normal.  There are no pathologic reflexes.  Balance is normal.     left knee normal range of motion stability strength skin without rash lesion or laceration   Exam of the right knee  Inspection tenderness over the medial compartment small joint effusion Range of motion full but painful range of motion Stability all ligament stable Strength grade 5 motor strength   Skin normal, no rash, or laceration. Provocative tests McMurray sign, Apley sign and screw home test positive for meniscal tear  A/P X-rays negative  Presumptive diagnosis torn medial meniscus right knee.  Recommend surgical arthroscopy right knee partial medial meniscectomy at the patient's convenience. Risks and benefits of procedure explained to the patient.          Past Medical History  Diagnosis Date  . Allergic rhinitis   . Diabetes mellitus without  complication     Past Surgical History  Procedure Laterality Date  . Shoulder arthroscopy Right     Family History  Problem Relation Age of Onset  . Heart disease    . Cancer    . Diabetes     Social History:  reports that he has never smoked. He does not have any smokeless tobacco history on file. He reports that he drinks alcohol. He reports that he does not use illicit drugs.  Allergies: No Known Allergies  No prescriptions prior to admission    No results found for this or any previous visit (from the past 48 hour(s)). No results found.  ROS  There were no vitals taken for this visit. Physical Exam   Assessment/Plan Torn medial meniscus right knee  Arthroscopy right knee partial medial meniscectomy  Arther Abbott 02/10/2014, 2:57 PM

## 2014-02-11 ENCOUNTER — Encounter (HOSPITAL_COMMUNITY)
Admission: RE | Disposition: A | Discharge status: Home / Self Care | Payer: Self-pay | Source: Ambulatory Visit | Attending: Orthopedic Surgery

## 2014-02-11 ENCOUNTER — Encounter (HOSPITAL_COMMUNITY): Payer: BC Managed Care – PPO | Admitting: Anesthesiology

## 2014-02-11 ENCOUNTER — Ambulatory Visit (HOSPITAL_COMMUNITY): Payer: BC Managed Care – PPO | Admitting: Anesthesiology

## 2014-02-11 ENCOUNTER — Ambulatory Visit (HOSPITAL_COMMUNITY)
Admission: RE | Admit: 2014-02-11 | Discharge: 2014-02-11 | Disposition: A | Discharge status: Home / Self Care | Payer: BC Managed Care – PPO | Source: Ambulatory Visit | Attending: Orthopedic Surgery | Admitting: Orthopedic Surgery

## 2014-02-11 ENCOUNTER — Encounter (HOSPITAL_COMMUNITY): Payer: Self-pay | Admitting: *Deleted

## 2014-02-11 DIAGNOSIS — M23329 Other meniscus derangements, posterior horn of medial meniscus, unspecified knee: Secondary | ICD-10-CM | POA: Diagnosis not present

## 2014-02-11 DIAGNOSIS — M2240 Chondromalacia patellae, unspecified knee: Secondary | ICD-10-CM | POA: Insufficient documentation

## 2014-02-11 DIAGNOSIS — M23321 Other meniscus derangements, posterior horn of medial meniscus, right knee: Secondary | ICD-10-CM

## 2014-02-11 DIAGNOSIS — E119 Type 2 diabetes mellitus without complications: Secondary | ICD-10-CM | POA: Insufficient documentation

## 2014-02-11 DIAGNOSIS — M1710 Unilateral primary osteoarthritis, unspecified knee: Secondary | ICD-10-CM | POA: Insufficient documentation

## 2014-02-11 HISTORY — PX: KNEE ARTHROSCOPY WITH MEDIAL MENISECTOMY: SHX5651

## 2014-02-11 LAB — GLUCOSE, CAPILLARY: Glucose-Capillary: 137 mg/dL — ABNORMAL HIGH (ref 70–99)

## 2014-02-11 SURGERY — ARTHROSCOPY, KNEE, WITH MEDIAL MENISCECTOMY
Anesthesia: General | Laterality: Right

## 2014-02-11 MED ORDER — SODIUM CHLORIDE 0.9 % IR SOLN
Status: DC | PRN
  Administered 2014-02-11 (×2)

## 2014-02-11 MED ORDER — PROMETHAZINE HCL 12.5 MG PO TABS: 12.5000 mg | ORAL_TABLET | Freq: Four times a day (QID) | ORAL | Status: DC | PRN

## 2014-02-11 MED ORDER — FENTANYL CITRATE 0.05 MG/ML IJ SOLN
25.0000 ug | INTRAMUSCULAR | Status: AC
  Administered 2014-02-11 (×2): 25 ug via INTRAVENOUS
  Filled 2014-02-11: qty 2

## 2014-02-11 MED ORDER — ONDANSETRON HCL 4 MG/2ML IJ SOLN
4.0000 mg | Freq: Once | INTRAMUSCULAR | Status: AC | PRN
  Administered 2014-02-11: 4 mg via INTRAVENOUS

## 2014-02-11 MED ORDER — ONDANSETRON HCL 4 MG/2ML IJ SOLN
4.0000 mg | Freq: Once | INTRAMUSCULAR | Status: AC
  Administered 2014-02-11: 4 mg via INTRAVENOUS
  Filled 2014-02-11: qty 2

## 2014-02-11 MED ORDER — LIDOCAINE HCL 1 % IJ SOLN
INTRAMUSCULAR | Status: DC | PRN
  Administered 2014-02-11: 40 mg via INTRADERMAL

## 2014-02-11 MED ORDER — BUPIVACAINE-EPINEPHRINE (PF) 0.5% -1:200000 IJ SOLN
INTRAMUSCULAR | Status: AC
  Filled 2014-02-11: qty 60

## 2014-02-11 MED ORDER — DEXAMETHASONE SODIUM PHOSPHATE 4 MG/ML IJ SOLN
4.0000 mg | Freq: Once | INTRAMUSCULAR | Status: AC
  Administered 2014-02-11: 4 mg via INTRAVENOUS
  Filled 2014-02-11: qty 1

## 2014-02-11 MED ORDER — BUPIVACAINE-EPINEPHRINE (PF) 0.5% -1:200000 IJ SOLN
INTRAMUSCULAR | Status: DC | PRN
  Administered 2014-02-11: 60 mL

## 2014-02-11 MED ORDER — FENTANYL CITRATE 0.05 MG/ML IJ SOLN
INTRAMUSCULAR | Status: DC | PRN
  Administered 2014-02-11 (×2): 25 ug via INTRAVENOUS
  Administered 2014-02-11: 50 ug via INTRAVENOUS

## 2014-02-11 MED ORDER — PROPOFOL 10 MG/ML IV BOLUS
INTRAVENOUS | Status: DC | PRN
  Administered 2014-02-11: 170 mg via INTRAVENOUS

## 2014-02-11 MED ORDER — FENTANYL CITRATE 0.05 MG/ML IJ SOLN
INTRAMUSCULAR | Status: AC
  Filled 2014-02-11: qty 5

## 2014-02-11 MED ORDER — CHLORHEXIDINE GLUCONATE 4 % EX LIQD: 60.0000 mL | Freq: Once | CUTANEOUS | Status: DC

## 2014-02-11 MED ORDER — HYDROCODONE-ACETAMINOPHEN 5-325 MG PO TABS: 1.0000 | ORAL_TABLET | Freq: Four times a day (QID) | ORAL | Status: DC | PRN

## 2014-02-11 MED ORDER — PROPOFOL 10 MG/ML IV BOLUS
INTRAVENOUS | Status: AC
  Filled 2014-02-11: qty 20

## 2014-02-11 MED ORDER — FENTANYL CITRATE 0.05 MG/ML IJ SOLN
INTRAMUSCULAR | Status: AC
  Filled 2014-02-11: qty 2

## 2014-02-11 MED ORDER — FENTANYL CITRATE 0.05 MG/ML IJ SOLN
25.0000 ug | INTRAMUSCULAR | Status: DC | PRN
  Administered 2014-02-11 (×2): 50 ug via INTRAVENOUS

## 2014-02-11 MED ORDER — ONDANSETRON HCL 4 MG/2ML IJ SOLN
INTRAMUSCULAR | Status: AC
  Filled 2014-02-11: qty 2

## 2014-02-11 MED ORDER — LIDOCAINE HCL (PF) 1 % IJ SOLN
INTRAMUSCULAR | Status: AC
  Filled 2014-02-11: qty 5

## 2014-02-11 MED ORDER — EPINEPHRINE HCL 1 MG/ML IJ SOLN
INTRAMUSCULAR | Status: AC
  Filled 2014-02-11: qty 4

## 2014-02-11 MED ORDER — LACTATED RINGERS IV SOLN
INTRAVENOUS | Status: DC
Start: 2014-02-11 — End: 2014-02-11
  Administered 2014-02-11: 07:00:00 via INTRAVENOUS

## 2014-02-11 MED ORDER — CEFAZOLIN SODIUM-DEXTROSE 2-3 GM-% IV SOLR
2.0000 g | INTRAVENOUS | Status: DC
  Administered 2014-02-11: 2 g via INTRAVENOUS
  Filled 2014-02-11: qty 50

## 2014-02-11 MED ORDER — MIDAZOLAM HCL 2 MG/2ML IJ SOLN
1.0000 mg | INTRAMUSCULAR | Status: DC | PRN
  Administered 2014-02-11: 2 mg via INTRAVENOUS
  Filled 2014-02-11: qty 2

## 2014-02-11 SURGICAL SUPPLY — 49 items
ARTHROWAND PARAGON T2 (SURGICAL WAND) ×2
BAG HAMPER (MISCELLANEOUS) ×2 IMPLANT
BANDAGE ELASTIC 6 VELCRO NS (GAUZE/BANDAGES/DRESSINGS) ×2 IMPLANT
BLADE 11 SAFETY STRL DISP (BLADE) ×2 IMPLANT
BLADE AGGRESSIVE PLUS 4.0 (BLADE) ×2 IMPLANT
CHLORAPREP W/TINT 26ML (MISCELLANEOUS) ×2 IMPLANT
CLOTH BEACON ORANGE TIMEOUT ST (SAFETY) ×2 IMPLANT
COOLER CRYO IC GRAV AND TUBE (ORTHOPEDIC SUPPLIES) ×2 IMPLANT
CUFF CRYO KNEE LG 20X31 COOLER (ORTHOPEDIC SUPPLIES) IMPLANT
CUFF CRYO KNEE18X23 MED (MISCELLANEOUS) ×2 IMPLANT
CUFF TOURNIQUET SINGLE 34IN LL (TOURNIQUET CUFF) IMPLANT
CUFF TOURNIQUET SINGLE 44IN (TOURNIQUET CUFF) IMPLANT
CUTTER ANGLED DBL BITE 4.5 (BURR) IMPLANT
DECANTER SPIKE VIAL GLASS SM (MISCELLANEOUS) ×4 IMPLANT
GAUZE SPONGE 4X4 12PLY STRL (GAUZE/BANDAGES/DRESSINGS) IMPLANT
GAUZE SPONGE 4X4 16PLY XRAY LF (GAUZE/BANDAGES/DRESSINGS) ×2 IMPLANT
GAUZE XEROFORM 5X9 LF (GAUZE/BANDAGES/DRESSINGS) ×2 IMPLANT
GLOVE SKINSENSE NS SZ8.0 LF (GLOVE) ×1
GLOVE SKINSENSE STRL SZ8.0 LF (GLOVE) ×1 IMPLANT
GLOVE SS N UNI LF 8.5 STRL (GLOVE) ×2 IMPLANT
GOWN STRL REUS W/TWL LRG LVL3 (GOWN DISPOSABLE) ×2 IMPLANT
GOWN STRL REUS W/TWL XL LVL3 (GOWN DISPOSABLE) ×2 IMPLANT
HLDR LEG FOAM (MISCELLANEOUS) ×1 IMPLANT
IV NS IRRIG 3000ML ARTHROMATIC (IV SOLUTION) ×6 IMPLANT
KIT BLADEGUARD II DBL (SET/KITS/TRAYS/PACK) ×2 IMPLANT
KIT ROOM TURNOVER AP CYSTO (KITS) ×2 IMPLANT
LEG HOLDER FOAM (MISCELLANEOUS) ×1
MANIFOLD NEPTUNE II (INSTRUMENTS) ×2 IMPLANT
MARKER SKIN DUAL TIP RULER LAB (MISCELLANEOUS) ×2 IMPLANT
NEEDLE HYPO 18GX1.5 BLUNT FILL (NEEDLE) IMPLANT
NEEDLE HYPO 21X1.5 SAFETY (NEEDLE) ×2 IMPLANT
NEEDLE SPNL 18GX3.5 QUINCKE PK (NEEDLE) ×2 IMPLANT
NS IRRIG 1000ML POUR BTL (IV SOLUTION) ×2 IMPLANT
PACK ARTHRO LIMB DRAPE STRL (MISCELLANEOUS) ×2 IMPLANT
PAD ABD 5X9 TENDERSORB (GAUZE/BANDAGES/DRESSINGS) ×2 IMPLANT
PAD ARMBOARD 7.5X6 YLW CONV (MISCELLANEOUS) ×2 IMPLANT
PADDING CAST COTTON 6X4 STRL (CAST SUPPLIES) IMPLANT
PADDING WEBRIL 6 STERILE (GAUZE/BANDAGES/DRESSINGS) ×2 IMPLANT
SET ARTHROSCOPY INST (INSTRUMENTS) ×2 IMPLANT
SET ARTHROSCOPY PUMP TUBE (IRRIGATION / IRRIGATOR) ×2 IMPLANT
SET BASIN LINEN APH (SET/KITS/TRAYS/PACK) ×2 IMPLANT
SPONGE GAUZE 4X4 12PLY (GAUZE/BANDAGES/DRESSINGS) ×2 IMPLANT
SUT ETHILON 3 0 FSL (SUTURE) ×2 IMPLANT
SYR 30ML LL (SYRINGE) ×2 IMPLANT
SYRINGE 12CC LL (MISCELLANEOUS) ×2 IMPLANT
WAND 50 DEG COVAC W/CORD (SURGICAL WAND) IMPLANT
WAND 90 DEG TURBOVAC W/CORD (SURGICAL WAND) ×2 IMPLANT
WAND ARTHRO PARAGON T2 (SURGICAL WAND) ×1 IMPLANT
YANKAUER SUCT BULB TIP 10FT TU (MISCELLANEOUS) ×6 IMPLANT

## 2014-02-11 NOTE — Discharge Instructions (Signed)
Ankle Arthroscopy, Care After Refer to this sheet in the next few weeks. These instructions provide you with information on caring for yourself after your procedure. Your health care provider may also give you more specific instructions. Your treatment has been planned according to current medical practices, but problems sometimes occur. Call your health care provider if you have any problems or questions after your procedure. WHAT TO EXPECT AFTER THE PROCEDURE After your procedure, it is typical to have the following sensations:  Your ankle may be swollen, stiff, and painful.  You may be constipated from the pain medicine given. HOME CARE INSTRUCTIONS  Keep your ankle elevated and wrapped to keep swelling down and to decrease pain. This also speeds healing.  Apply ice to the injured area:  Put ice in a plastic bag.  Place a towel between your skin and the bag.  Leave the ice on for 20 minutes, 2-3 times a day.  If you are constipated, drink 6-8 glasses of water a day and eat fruits and vegetables.  Remove the dressings as directed and shower as directed by your health care provider.  If physical therapy and exercises are prescribed by your health care provider, do them as directed.  You will be given medicine to control the pain. Take all medicines as directed by your health care provider. SEEK MEDICAL CARE IF:  You have a fever.  You have pus coming from your incision site.  You notice a bad smell from your incision site.  Your incision site breaks open after your stitches or tape has been removed.  You have numbness in your foot or toes that gets worse. SEEK IMMEDIATE MEDICAL CARE IF:   You have chest pain or shortness of breath.  You have ankle pain that is not relieved with pain medicine. Document Released: 03/10/2013 Document Revised: 10/04/2013 Document Reviewed: 03/10/2013 [facility] Patient Information 2015 [place], [place]. This information is not intended to  replace advice given to you by your health care provider. Make sure you discuss any questions you have with your health care provider.

## 2014-02-11 NOTE — Brief Op Note (Signed)
02/11/2014  8:18 AM  PATIENT:  [name]  48 y.o. male  PRE-OPERATIVE DIAGNOSIS:  right medial meniscus tear  POST-OPERATIVE DIAGNOSIS:  OSTEOARTHRITIS, right medial meniscus tear  PROCEDURE:  Procedure(s): KNEE ARTHROSCOPY WITH MEDIAL MENISECTOMY AND CHONDROPLASTY (Right) Operative findings torn medial meniscus at the junction of the body and posterior horn. Chondral malacia grade 2 chondral flap medial femoral condyle/osteoarthritis.  Notch area normal patellofemoral joint normal lateral compartment normal. SURGEON:  Surgeon(s) and Role:    * Carole Civil, MD - Primary  PHYSICIAN ASSISTANT:   ASSISTANTS: none   ANESTHESIA:   general  EBL:  Total I/O In: 700 [I.V.:700] Out: 0   BLOOD ADMINISTERED:none  DRAINS: none   LOCAL MEDICATIONS USED:  MARCAINE     SPECIMEN:  No Specimen  DISPOSITION OF SPECIMEN:  N/A  COUNTS:  YES  TOURNIQUET:  * No tourniquets in log *  DICTATION: .Dragon Dictation  PLAN OF CARE: Discharge to home after PACU  PATIENT DISPOSITION:  PACU - hemodynamically stable.   Delay start of Pharmacological VTE agent (>24hrs) due to surgical blood loss or risk of bleeding: not applicable

## 2014-02-11 NOTE — Anesthesia Postprocedure Evaluation (Signed)
  Anesthesia Post-op Note  Patient: Paul Barnes  Procedure(s) Performed: Procedure(s): KNEE ARTHROSCOPY WITH MEDIAL MENISECTOMY AND CHONDROPLASTY (Right)  Patient Location: PACU  Anesthesia Type:General  Level of Consciousness: awake and alert   Airway and Oxygen Therapy: Patient Spontanous Breathing and Patient connected to face mask oxygen  Post-op Pain: mild  Post-op Assessment: Post-op Vital signs reviewed, Patient's Cardiovascular Status Stable, Respiratory Function Stable, Patent Airway and No signs of Nausea or vomiting  Post-op Vital Signs: Reviewed and stable  Last Vitals:  Filed Vitals:   02/11/14 0729  BP: 105/65  Pulse:   Temp:   Resp: 93    Complications: No apparent anesthesia complications

## 2014-02-11 NOTE — Transfer of Care (Signed)
Immediate Anesthesia Transfer of Care Note  Patient: Paul Barnes  Procedure(s) Performed: Procedure(s): KNEE ARTHROSCOPY WITH MEDIAL MENISECTOMY AND CHONDROPLASTY (Right)  Patient Location: PACU  Anesthesia Type:General  Level of Consciousness: awake, alert  and oriented  Airway & Oxygen Therapy: Patient Spontanous Breathing and Patient connected to face mask oxygen  Post-op Assessment: Report given to PACU RN  Post vital signs: Reviewed and stable  Complications: No apparent anesthesia complications

## 2014-02-11 NOTE — Anesthesia Procedure Notes (Signed)
Procedure Name: LMA Insertion Date/Time: 02/11/2014 7:43 AM Performed by: Tressie Stalker E Pre-anesthesia Checklist: Patient identified, Patient being monitored, Emergency Drugs available, Timeout performed and Suction available Patient Re-evaluated:Patient Re-evaluated prior to inductionOxygen Delivery Method: Circle System Utilized Preoxygenation: Pre-oxygenation with 100% oxygen Intubation Type: IV induction Ventilation: Mask ventilation without difficulty LMA: LMA inserted LMA Size: 5.0 Number of attempts: 1 Placement Confirmation: positive ETCO2 and breath sounds checked- equal and bilateral

## 2014-02-11 NOTE — Op Note (Signed)
Preop diagnosis osteoarthritis torn medial meniscus right knee  Postop diagnosis same  Procedure arthroscopy RIGHT  knee partial medial meniscectomy  Surgeon Aline Brochure  Anesthesia Gen.  Operative findings : MEDIAL torn medial meniscus junction body and posterior horn. Chondromalacia grade 2 osteoarthritis medial femoral condyle  LATERAL normal  PATELLA normal  TROCHLEA normal   Indications for procedure pain mechanical symptoms unresponsive to nonoperative treatment  The patient was identified in the preop holding area as [facility] the RIGHT knee was confirmed as a surgical site and marked. The chart was reviewed  The patient was taken to the operating room and  was given appropriate preoperative antibiotic 2GM ANCEF and general anesthesia was administered. The operative leg (RIGHT) was placed in the arthroscopic leg holder, the well leg was placed in a well leg holder  The RIGHT leg was then prepped and draped sterile The surgical site was confirmed and the timeout procedure was completed  The lateral portal was injected with Marcaine with epinephrine solution and a stab wound was made. The scope was placed in the lateral portal into the medial compartment. The  Diagnostic portion of the  arthroscopy was completed. A medial portal was established in the same fashion and a probe was placed into the joint. The diagnostic arthroscopy   was repeated using a probe to palpate intra-articular structures  A duckbill forceps was used to morcellized the medial meniscal tear, the fragments were removed with a motorized shaver. The meniscus was then balanced with an ArthroCare wand 90 probe. We did a chondroplasty with the shaver and the Paragon wand  A probe was then used to confirm a stable rim.  The knee was irrigated meniscal fragments remaining were removed. The portals were closed with 3-0 nylon suture. The knee joint was then injected with 45 cc of Marcaine with epinephrine. A  sterile dressing was applied followed by an Ace bandage and a Cryo/Cuff.  The patient was extubated and taken to the recovery room in stable condition.

## 2014-02-11 NOTE — Interval H&P Note (Signed)
History and Physical Interval Note:  02/11/2014 7:27 AM  Paul Barnes  has presented today for surgery, with the diagnosis of right medial meniscus tear  The various methods of treatment have been discussed with the patient and family. After consideration of risks, benefits and other options for treatment, the patient has consented to  Procedure(s): KNEE ARTHROSCOPY WITH MEDIAL MENISECTOMY (Right) as a surgical intervention .  The patient's history has been reviewed, patient examined, no change in status, stable for surgery.  I have reviewed the patient's chart and labs.  Questions were answered to the patient's satisfaction.     Arther Abbott

## 2014-02-11 NOTE — Anesthesia Preprocedure Evaluation (Signed)
Anesthesia Evaluation  Patient identified by MRN, date of birth, ID band Patient awake    Reviewed: Allergy & Precautions, H&P , NPO status , Patient's Chart, lab work & pertinent test results  Airway Mallampati: II TM Distance: >3 FB Neck ROM: Full    Dental  (+) Teeth Intact   Pulmonary neg pulmonary ROS,  breath sounds clear to auscultation        Cardiovascular negative cardio ROS  Rhythm:Regular Rate:Normal     Neuro/Psych    GI/Hepatic negative GI ROS,   Endo/Other  diabetes, Well Controlled, Type 2, Oral Hypoglycemic Agents  Renal/GU      Musculoskeletal   Abdominal   Peds  Hematology   Anesthesia Other Findings   Reproductive/Obstetrics                           Anesthesia Physical Anesthesia Plan  ASA: II  Anesthesia Plan: General   Post-op Pain Management:    Induction: Intravenous  Airway Management Planned: LMA  Additional Equipment:   Intra-op Plan:   Post-operative Plan: Extubation in OR  Informed Consent: I have reviewed the patients History and Physical, chart, labs and discussed the procedure including the risks, benefits and alternatives for the proposed anesthesia with the patient or authorized representative who has indicated his/her understanding and acceptance.     Plan Discussed with:   Anesthesia Plan Comments:         Anesthesia Quick Evaluation

## 2014-02-14 ENCOUNTER — Ambulatory Visit (INDEPENDENT_AMBULATORY_CARE_PROVIDER_SITE_OTHER): Payer: BC Managed Care – PPO | Admitting: Orthopedic Surgery

## 2014-02-14 ENCOUNTER — Encounter: Payer: Self-pay | Admitting: Orthopedic Surgery

## 2014-02-14 VITALS — BP 140/98 | Ht 75.0 in | Wt 242.0 lb

## 2014-02-14 DIAGNOSIS — M23321 Other meniscus derangements, posterior horn of medial meniscus, right knee: Secondary | ICD-10-CM

## 2014-02-14 DIAGNOSIS — M23329 Other meniscus derangements, posterior horn of medial meniscus, unspecified knee: Secondary | ICD-10-CM

## 2014-02-14 DIAGNOSIS — Z9889 Other specified postprocedural states: Secondary | ICD-10-CM

## 2014-02-14 NOTE — Progress Notes (Signed)
Chief Complaint  Patient presents with  . Follow-up    post op #1, SARK, DOS 02/11/14   PRE-OPERATIVE DIAGNOSIS: right medial meniscus tear  POST-OPERATIVE DIAGNOSIS: OSTEOARTHRITIS, right medial meniscus tear  PROCEDURE: Procedure(s):  KNEE ARTHROSCOPY WITH MEDIAL MENISECTOMY AND CHONDROPLASTY (Right)  Operative findings torn medial meniscus at the junction of the body and posterior horn. Chondral malacia grade 2 chondral flap medial femoral condyle/osteoarthritis.  Notch area normal patellofemoral joint normal lateral compartment normal.  Current status: His knee looks good his pain is well controlled with Norco. He can start physical therapy followup in 4 weeks. His portals are clean dry and intact without drainage  He has 90 knee flexion is ambulatory with minimal assistance.

## 2014-02-14 NOTE — Patient Instructions (Signed)
WEIGHT BEARING AS TOLERATED  CALL TO ARRANGE PHYSICAL THERAPY

## 2014-02-14 NOTE — Addendum Note (Signed)
Addendum created 02/14/14 0731 by Ollen Bowl, CRNA   Modules edited: Anesthesia Events, Anesthesia Medication Administration

## 2014-02-17 ENCOUNTER — Ambulatory Visit (HOSPITAL_COMMUNITY)
Admission: RE | Admit: 2014-02-17 | Discharge: 2014-02-17 | Disposition: A | Discharge status: Home / Self Care | Payer: BC Managed Care – PPO | Source: Ambulatory Visit | Attending: Orthopedic Surgery | Admitting: Orthopedic Surgery

## 2014-02-17 ENCOUNTER — Telehealth (HOSPITAL_COMMUNITY): Payer: Self-pay

## 2014-02-17 DIAGNOSIS — M25669 Stiffness of unspecified knee, not elsewhere classified: Secondary | ICD-10-CM | POA: Diagnosis not present

## 2014-02-17 DIAGNOSIS — M6281 Muscle weakness (generalized): Secondary | ICD-10-CM | POA: Diagnosis not present

## 2014-02-17 DIAGNOSIS — M25676 Stiffness of unspecified foot, not elsewhere classified: Secondary | ICD-10-CM

## 2014-02-17 DIAGNOSIS — IMO0001 Reserved for inherently not codable concepts without codable children: Secondary | ICD-10-CM | POA: Diagnosis not present

## 2014-02-17 DIAGNOSIS — R262 Difficulty in walking, not elsewhere classified: Secondary | ICD-10-CM | POA: Insufficient documentation

## 2014-02-17 DIAGNOSIS — M25569 Pain in unspecified knee: Secondary | ICD-10-CM | POA: Insufficient documentation

## 2014-02-17 DIAGNOSIS — M25673 Stiffness of unspecified ankle, not elsewhere classified: Secondary | ICD-10-CM | POA: Insufficient documentation

## 2014-02-17 NOTE — Evaluation (Signed)
Physical Therapy Evaluation  Patient Details  Name: Paul Barnes MRN: [id number] Date of Birth: 1965-08-20  Today's Date: 02/17/2014 Time: 5732-2025 PT Time Calculation (min): 41 min     Charges: 1 Eval, TE 4270, 6237         Visit#: 1 of 20  Re-eval: 03/19/14 Assessment Diagnosis: Rt knee pain s/p arthroscopic surgery  Surgical Date: 02/11/14 Next MD Visit: Aline Brochure 03/14/14 Prior Therapy: no  Authorization: [employer] call # on intake sheet for more visits after initial 8 visits    Authorization Time Period:    Authorization Visit#: 1 of 8   Past Medical History:  Past Medical History  Diagnosis Date  . Allergic rhinitis   . Diabetes mellitus without complication    Past Surgical History:  Past Surgical History  Procedure Laterality Date  . Shoulder arthroscopy Right   . Knee arthroscopy with medial menisectomy Right 02/11/2014    Procedure: KNEE ARTHROSCOPY WITH MEDIAL MENISECTOMY AND CHONDROPLASTY;  Surgeon: Carole Civil, MD;  Location: [facility];  Service: Orthopedics;  Laterality: Right;    Subjective Symptoms/Limitations Symptoms: Pain in Rt knee. hurts with twisting, prolonged weight bearing. Patient also notes a recent history of plantar fascitis.  Pertinent History: Patient had surgery on Firday 02/11/14 with no pain immediately follwoing. Patient has since had increased pain, but only minor. WBAT now ambualting with cain with minimal to no pain. 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   Cognition/Observation Observation/Other  Assessments Observations: Walking: early heel rise, limited toe in, excessive toe out,  Other Assessments: 3D hip excursion limited: squatability, limited hip extension.   Sensation/Coordination/Flexibility/Functional Tests Flexibility Thomas: Positive Obers: Positive 90/90: Positive Functional Tests Functional Tests: positive Rt piriformis test  Assessment RLE AROM (degrees) Right Knee Extension: -2 Right Knee Flexion: 125 Right Ankle Dorsiflexion: 11 RLE Strength Right Hip Flexion: 4/5 Right Hip Extension: 3+/5 Right Hip External Rotation : 55 Right Hip Internal Rotation : 6 Right Hip ABduction: 3+/5 Right Knee Flexion: 3+/5 Right Knee Extension: 4/5 Right Ankle Dorsiflexion: 4/5 Palpation Palpation: pain in anterior knee.   Exercise/Treatments 3D hip excursion 10x hip flexor stretch, hamstring stretch, and calf stretch 4 x 20 seconds each  Physical Therapy Assessment and Plan PT Assessment and Plan Clinical Impression Statement: Patient displasy Rt LE weakness and Rt knee pain secondary to abnormal gait resulting in difficulty walking following Rt knee arthroplastic surgery to repair damaged meniscus. Specificlay patient ambulates with excessive toe out, early heel raise with limited ankle dorsiflexion and excessive knee varus moment. attributed to limited gastroc mobility, limitited hip interal rotation mobility, IT band and hip flexor stiffness and weakness of hamstrings, glut medius, and glut maximus. Patient will benefit from skilel dphsyical therapy to address the above listed limitations and return to working as Lawyer.. initial prescription for 3 visits for 4 weeks, expect patient will require therapy for 8 weeks 2x a week for the remainign 4 weeks to reach all long term goals.  Pt will benefit from skilled therapeutic intervention in order to improve on the following deficits:  Abnormal gait;Decreased activity tolerance;Decreased balance;Difficulty  walking;Pain;Decreased strength;Improper body mechanics;Decreased range of motion Rehab Potential: Good PT Frequency: Min 3X/week PT Duration: 4 weeks PT Treatment/Interventions: Gait training;Stair training;Functional mobility training;Therapeutic activities;Therapeutic exercise;Balance training;Modalities;Manual techniques;Patient/family education PT Plan: Initial focus on increasign flexibility of hip flexors, hamstrings, gastrocs, piriformis and ITBand. As mobility improves focus to shift to increasign hamstring, glut med/max strength so patient can perform full squatto the ground without pain.     Goals Home Exercise Program Pt/caregiver will Perform Home Exercise Program: For increased ROM PT Goal: Perform Home Exercise Program - Progress: Goal set today PT Short Term Goals Time to Complete Short Term Goals: 4 weeks PT Short Term Goal 1: Patient will display increased ankle dorsiflexion of 20 decrees indicating decreased early heel off during gait. PT Short Term Goal 2: Patient will display increased hip internal rotation of 20 degrees on right to improve shock absrorbtion mechanisms during gait. PT Short Term Goal 3: Patient will display increased hip mobility py performing negative thomas, piriformis, and obers test to improve ambulation tolerance.  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  PT Long Term Goals Time to Complete Long Term Goals: 8 weeks PT Long Term Goal 1: Patient will display increased hip internal rotation of >30 degrees on right to improve shock absrorbtion mechanisms during gait PT Long Term Goal 2: Patient will display increased hamstring strength to 4/5 MMT to be able to better cotrol knee twisting during gait.  Long Term Goal 3: Patient will displasy increased glut max/med strength of >4-/5 to be able to better balance on Rt LE and contol knee  stability during gait.  Long Term Goal 4: Patient will be bale to walk > 30 minutes withtout pain.   Problem List Patient Active Problem List   Diagnosis Date Noted  . Difficulty in walking(719.7) 02/17/2014  . Stiffness of joint, not elsewhere classified, lower leg 02/17/2014  . Stiffness of joint, not elsewhere classified, ankle and foot 02/17/2014  . Muscle weakness (generalized) 02/17/2014  . Derangement of posterior horn of medial meniscus 02/03/2014  . Plantar fasciitis of left foot 01/18/2014  . Type II or unspecified type diabetes mellitus without mention of complication, not stated as uncontrolled 09/18/2013    PT - End of Session Activity Tolerance: Patient tolerated treatment well General Behavior During Therapy: WFL for tasks assessed/performed PT Plan of Care PT Home Exercise Plan: 3D hip excursion, hip flexor stretch, hamstring stretch, and calf stretch PT Patient Instructions: given Consulted and Agree with Plan of Care: Patient  GP    Leia Alf 02/17/2014, 1:16 PM  Physician Documentation Your signature is required to indicate approval of the treatment plan as stated above.  Please sign and either send electronically or make a copy of this report for your files and return this physician signed original.   Please mark one 1.__approve of plan  2. ___approve of plan with the following conditions.   ______________________________                                                          _____________________ Physician Signature  Date  

## 2014-02-21 ENCOUNTER — Ambulatory Visit (HOSPITAL_COMMUNITY)
Admission: RE | Admit: 2014-02-21 | Discharge: 2014-02-21 | Disposition: A | Discharge status: Home / Self Care | Payer: BC Managed Care – PPO | Source: Ambulatory Visit | Attending: Family Medicine | Admitting: Family Medicine

## 2014-02-21 DIAGNOSIS — IMO0001 Reserved for inherently not codable concepts without codable children: Secondary | ICD-10-CM | POA: Diagnosis not present

## 2014-02-21 NOTE — Progress Notes (Signed)
Physical Therapy Treatment Patient Details  Name: Paul Barnes MRN: [id number] Date of Birth: 02/25/1966  Today's Date: 02/21/2014 Time: 4098-1191 PT Time Calculation (min): 10 min TE 4782-9562  Visit#: 2 of 20  Re-eval: 03/19/14 Assessment Diagnosis: Rt knee pain s/p arthroscopic surgery  Surgical Date: 02/11/14 Next MD Visit: Aline Brochure 03/14/14   Subjective: Symptoms/Limitations Symptoms: Pt reports mild pain in the Rt knee 3/10. Pt presents to clinic without AD; pt reports he only uses his cane in the mornings secondary to stiffness.  Pt presents wearing knee brace on the Rt knee.   Pain Assessment Currently in Pain?: Yes Pain Score: 3  Pain Location: Knee Pain Orientation: Right   Exercise/Treatments Stretches Active Hamstring Stretch: 3 reps;30 seconds;Limitations Active Hamstring Stretch Limitations: 14" Box Quad Stretch: 3 reps;30 seconds;Limitations Quad Stretch Limitations: Prone with rope Knee: Self-Stretch to increase Flexion: Limitations Knee: Self-Stretch Limitations: 10 reps, 14" Box Gastroc Stretch: 3 reps;30 seconds;Limitations Gastroc Stretch Limitations: Slantboard Aerobic Stationary Bike: Seat 12, 4' Standing Heel Raises: 20 reps;Limitations Heel Raises Limitations: TR x20 Functional Squat: 3 sets;Limitations Functional Squat Limitations: Squat Matrix 2# SLS: Rt 12", 15" (avg 13.5"), Lt 12", 22" (avg 17") Other Standing Knee Exercises: Monster Walk sideways/backward, GTB 30 feet x2 each Sidelying Hip ABduction: 2 sets;10 reps;Right Prone  Hip Extension: 2 sets;10 reps;Right      Physical Therapy Assessment and Plan PT Assessment and Plan Clinical Impression Statement: Initiated PT POC focusing on ROM, stretching, and strengthening.  Pt reports improvements in functional mobility skills and is able to amb without an AD most of the day (pt reports use of cane in the mornings primarily due to hx of plantar fascitis and foot stiffness/pain  upon waking).  Pt was able to complete exercise program today without increases in pain.  Pt does demonstrate improved knee flexion today (from 125 degrees to 131 degrees).  Pt completed stretching and OKC exercises without knee brace, though did wear knee brace with CKC exercises for stability.  Pt will benefit from skilled therapeutic intervention in order to improve on the following deficits: Abnormal gait;Decreased activity tolerance;Decreased balance;Difficulty walking;Pain;Decreased strength;Improper body mechanics;Decreased range of motion PT Frequency: Min 3X/week PT Duration: 4 weeks PT Treatment/Interventions: Gait training;Stair training;Functional mobility training;Therapeutic activities;Therapeutic exercise;Balance training;Modalities;Manual techniques;Patient/family education PT Plan: Progress CKC exercises as tolerated, increasing reps for squatting and adding lunging exercises.      Goals PT Short Term Goals PT Short Term Goal 1: Patient will display increased ankle dorsiflexion of 20 decrees indicating decreased early heel off during gait. PT Short Term Goal 1 - Progress: Progressing toward goal PT Short Term Goal 3: Patient will display increased hip mobility py performing negative thomas, piriformis, and obers test to improve ambulation tolerance.  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  PT Short Term Goal 4 - Progress: Progressing toward goal  Problem List Patient Active Problem List   Diagnosis Date Noted  . Difficulty in walking(719.7) 02/17/2014  . Stiffness of joint, not elsewhere classified, lower leg 02/17/2014  . Stiffness of joint, not elsewhere classified, ankle and foot 02/17/2014  . Muscle weakness (generalized) 02/17/2014  . Derangement of posterior horn of medial meniscus 02/03/2014  .  Plantar fasciitis of left foot 01/18/2014  . Type II or unspecified type diabetes mellitus without mention  of complication, not stated as uncontrolled 09/18/2013    PT - End of Session Activity Tolerance: Patient tolerated treatment well   Nhia Heaphy 02/21/2014, 2:04 PM

## 2014-02-24 ENCOUNTER — Ambulatory Visit (HOSPITAL_COMMUNITY)
Admission: RE | Admit: 2014-02-24 | Discharge: 2014-02-24 | Disposition: A | Discharge status: Home / Self Care | Payer: BC Managed Care – PPO | Source: Ambulatory Visit | Attending: Orthopedic Surgery | Admitting: Orthopedic Surgery

## 2014-02-24 DIAGNOSIS — IMO0001 Reserved for inherently not codable concepts without codable children: Secondary | ICD-10-CM | POA: Diagnosis not present

## 2014-02-24 NOTE — Progress Notes (Signed)
Physical Therapy Treatment Patient Details  Name: Paul Barnes MRN: [id number] Date of Birth: 1966-01-15  Today's Date: 02/24/2014 Time: 1020-1104 PT Time Calculation (min): 44 min Charge; TE 1020-1104  Visit#: 3 of 20  Re-eval: 03/19/14 Assessment Diagnosis: Rt knee pain s/p arthroscopic surgery  Surgical Date: 02/11/14 Next MD Visit: Aline Brochure 03/14/14 Prior Therapy: no  Authorization: [employer] call # on intake sheet for more visits after initial 8 visits  Authorization Time Period:    Authorization Visit#: 3 of 8   Subjective: Symptoms/Limitations Symptoms: Pt stated he was really sore following last session.  Current pain scale 3/10 medial portion of Rt lmee.  Pt presents with brace on Rt knee.   Pain Assessment Currently in Pain?: Yes Pain Score: 3  Pain Location: Knee Pain Orientation: Right  Objective:   Exercise/Treatments Stretches Active Hamstring Stretch: 3 reps;30 seconds;Limitations Active Hamstring Stretch Limitations: 14" Box 3 directions Quad Stretch: 1 rep;30 seconds;Limitations Quad Stretch Limitations: Prone with rope Knee: Self-Stretch to increase Flexion: Limitations;3 reps;30 seconds Knee: Self-Stretch Limitations: hip flexor st Gastroc Stretch: 3 reps;30 seconds;Limitations Gastroc Stretch Limitations: Slantboard Soleus Stretch: Limitations Soleus Stretch Limitations: plantar fascia stretch 1x 30" Aerobic Stationary Bike: Seat 12, 6' Standing Heel Raises: Limitations Heel Raises Limitations: heel and toe walking Functional Squat: 5 reps;Limitations Functional Squat Limitations: squat reach matrix 2# SLS: Rt 41", Lt 60"+ SLS with Vectors: 3x 10" Other Standing Knee Exercises: Monster Walk sideways/backward, GTB 30 feet x2 each Supine Bridges: 10 reps Sidelying Hip ABduction: Right;15 reps Prone  Hip Extension: Right;15 reps   Physical Therapy Assessment and Plan PT Assessment and Plan Clinical Impression Statement: Reviewed  stretches to assure correct form with HEP, pt able to demonstrate appropraite form and techmique with all exercises.  Therapist facilitation with squats for proper loading to reduce stress on knee, pt reported pain increased with rotation wtih squat reach matrix.  Pt c/o foot pain with plantar fascitis, pt instructed plantar fascia stretch on step to add to HEP to assist wtih gait.  Pt imprroved SLS, progressed to vector stance to improve overall stability.  Reviewed HEP exercises, pt given SLR in supine, sidelying and prone for hip and gluteal strengthening. PT Plan: Progress CKC exercises as tolerated, increasing reps for squatting and adding lunging exercises.      Goals PT Short Term Goals PT Short Term Goal 1: Patient will display increased ankle dorsiflexion of 20 decrees indicating decreased early heel off during gait. PT Short Term Goal 1 - Progress: Progressing toward goal PT Short Term Goal 2: Patient will display increased hip internal rotation of 20 degrees on right to improve shock absrorbtion mechanisms during gait. PT Short Term Goal 2 - Progress: Progressing toward goal PT Short Term Goal 3: Patient will display increased hip mobility py performing negative thomas, piriformis, and obers test to improve ambulation tolerance.  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  PT Short Term Goal 4 - Progress: Progressing toward goal PT Long Term Goals PT Long Term Goal 1: Patient will display increased hip internal rotation of >30 degrees on right to improve shock absrorbtion mechanisms during gait PT Long Term Goal 2: Patient will display increased hamstring strength to 4/5 MMT to be able to better cotrol knee twisting during gait.  Long Term Goal 3:  Patient will displasy increased glut max/med strength of >4-/5 to be  able to better balance on Rt LE and contol knee stability during gait.  Long Term Goal 4: Patient will be bale to walk > 30 minutes withtout pain.   Problem List Patient Active Problem List   Diagnosis Date Noted  . Difficulty in walking(719.7) 02/17/2014  . Stiffness of joint, not elsewhere classified, lower leg 02/17/2014  . Stiffness of joint, not elsewhere classified, ankle and foot 02/17/2014  . Muscle weakness (generalized) 02/17/2014  . Derangement of posterior horn of medial meniscus 02/03/2014  . Plantar fasciitis of left foot 01/18/2014  . Type II or unspecified type diabetes mellitus without mention of complication, not stated as uncontrolled 09/18/2013    PT - End of Session Activity Tolerance: Patient tolerated treatment well General Behavior During Therapy: [facility] for tasks assessed/performed  GP    Aldona Lento 02/24/2014, 1:09 PM

## 2014-03-01 ENCOUNTER — Ambulatory Visit (HOSPITAL_COMMUNITY)
Admission: RE | Admit: 2014-03-01 | Discharge: 2014-03-01 | Disposition: A | Discharge status: Home / Self Care | Payer: BC Managed Care – PPO | Source: Ambulatory Visit | Attending: Family Medicine | Admitting: Family Medicine

## 2014-03-01 DIAGNOSIS — IMO0001 Reserved for inherently not codable concepts without codable children: Secondary | ICD-10-CM | POA: Diagnosis not present

## 2014-03-01 NOTE — Progress Notes (Signed)
Physical Therapy Treatment Patient Details  Name: Paul Barnes MRN: [id number] Date of Birth: [birth year]  Today's Date: 03/01/2014 Time: 1018-1100 PT Time Calculation (min): 42 min Charge: TE 1018-1100  Visit#: 4 of 20  Re-eval: 03/19/14 Assessment Diagnosis: Rt knee pain s/p arthroscopic surgery  Surgical Date: 02/11/14 Next MD Visit: Aline Brochure 03/14/14 Prior Therapy: no  Authorization: [employer] call # on intake sheet for more visits after initial 8 visits  Authorization Time Period:    Authorization Visit#: 4 of 8   Subjective: Symptoms/Limitations Symptoms: Pain scale 2/10 with squats and twisting.  Pt stated he has been without brace on knee since last week.  Went to car event this weekend and was on feet all day long.   Pain Assessment Currently in Pain?: Yes Pain Score: 2  Pain Location: Knee Pain Orientation: Right  Objective:  Exercise/Treatments Stretches Active Hamstring Stretch: 3 reps;30 seconds;Limitations Active Hamstring Stretch Limitations: 14" Box 3 directions Quad Stretch: 3 reps;30 seconds;Limitations Quad Stretch Limitations: Prone with rope Gastroc Stretch: 3 reps;30 seconds;Limitations Gastroc Stretch Limitations: Slantboard Aerobic Stationary Bike: 8', seat 8 resistance 3.5 Standing Forward Lunges: Right;15 reps;Limitations Forward Lunges Limitations: 6 in Side Lunges: Right;10 reps;Limitations Side Lunges Limitations: 6in step Terminal Knee Extension: 10 reps;Theraband Theraband Level (Terminal Knee Extension): Level 4 (Blue) Functional Squat: 10 reps;Limitations Functional Squat Limitations: squat reach matrix 2# Stairs: 4in reciprocal 1RT, 7in reciprocal pattern 5RT with no HHA ascending intermittent HHA descending SLS with Vectors: 5x 10"; common balance reach matrix 5x Rebounder:       Physical Therapy Assessment and Plan PT Assessment and Plan Clinical Impression Statement: Progressed strengthening and stability this session, added  balance reach matrix for stability.  Pt c/o medial knee pain during squats with lateral deviation, therapist facilitaiton to reduce deviation with less pain stated.  Added VMO strengthening wtih wall squats without difficutly.  Noted improved gait mechanics at end of session, no reports of pain through session. PT Plan: Progress CKC exercises as tolerated, increasing reps for squatting and adding lunging exercises.  Increased mobility next session with piriformis,ITB, TFL and lateral hamstrings stretch to assist with lateral deviation with squats.      Goals PT Short Term Goals PT Short Term Goal 1: Patient will display increased ankle dorsiflexion of 20 decrees indicating decreased early heel off during gait. PT Short Term Goal 1 - Progress: Progressing toward goal PT Short Term Goal 2: Patient will display increased hip internal rotation of 20 degrees on right to improve shock absrorbtion mechanisms during gait. PT Short Term Goal 2 - Progress: Progressing toward goal PT Short Term Goal 3: Patient will display increased hip mobility py performing negative thomas, piriformis, and obers test to improve ambulation tolerance.  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  PT Short Term Goal 4 - Progress: Progressing toward goal PT Long Term Goals PT Long Term Goal 1: Patient will display increased hip internal rotation of >30 degrees on right to improve shock absrorbtion mechanisms during gait PT Long Term Goal 2: Patient will display increased hamstring strength to 4/5 MMT to be able to better cotrol knee twisting during gait.  PT Long Term Goal 2 - Progress: Progressing toward goal Long Term Goal 3: Patient will displasy increased glut max/med strength of >4-/5 to be able  to better balance on Rt LE and contol knee stability during  gait.  Long Term Goal 3 Progress: Progressing toward goal Long Term Goal 4: Patient will be bale to walk > 30 minutes withtout pain.   Problem List Patient Active Problem List   Diagnosis Date Noted  . Difficulty in walking(719.7) 02/17/2014  . Stiffness of joint, not elsewhere classified, lower leg 02/17/2014  . Stiffness of joint, not elsewhere classified, ankle and foot 02/17/2014  . Muscle weakness (generalized) 02/17/2014  . Derangement of posterior horn of medial meniscus 02/03/2014  . Plantar fasciitis of left foot 01/18/2014  . Type II or unspecified type diabetes mellitus without mention of complication, not stated as uncontrolled 09/18/2013    PT - End of Session Activity Tolerance: Patient tolerated treatment well General Behavior During Therapy: [facility] for tasks assessed/performed  GP    Aldona Lento 03/01/2014, 11:11 AM

## 2014-03-04 ENCOUNTER — Ambulatory Visit (HOSPITAL_COMMUNITY)
Admission: RE | Admit: 2014-03-04 | Discharge: 2014-03-04 | Disposition: A | Discharge status: Home / Self Care | Payer: BC Managed Care – PPO | Source: Ambulatory Visit | Attending: Family Medicine | Admitting: Family Medicine

## 2014-03-04 DIAGNOSIS — M25561 Pain in right knee: Secondary | ICD-10-CM | POA: Diagnosis not present

## 2014-03-04 DIAGNOSIS — R262 Difficulty in walking, not elsewhere classified: Secondary | ICD-10-CM | POA: Insufficient documentation

## 2014-03-04 DIAGNOSIS — M25671 Stiffness of right ankle, not elsewhere classified: Secondary | ICD-10-CM | POA: Insufficient documentation

## 2014-03-04 DIAGNOSIS — E119 Type 2 diabetes mellitus without complications: Secondary | ICD-10-CM | POA: Diagnosis not present

## 2014-03-04 DIAGNOSIS — Z5189 Encounter for other specified aftercare: Secondary | ICD-10-CM | POA: Diagnosis not present

## 2014-03-04 DIAGNOSIS — M6281 Muscle weakness (generalized): Secondary | ICD-10-CM | POA: Diagnosis not present

## 2014-03-04 NOTE — Progress Notes (Signed)
Physical Therapy Treatment Patient Details  Name: Paul Barnes MRN: [id number] Date of Birth: 1965/08/20  Today's Date: 03/04/2014 Time: 1020-1101 PT Time Calculation (min): 41 min   TE: 1020-1041, Manual: 1041-1101 Visit#: 6 of 20  Re-eval: 03/19/14 Assessment Diagnosis: Rt knee pain s/p arthroscopic surgery  Surgical Date: 02/11/14 Next MD Visit: Aline Brochure 03/14/14 Prior Therapy: no  Authorization: [employer] call # on intake sheet for more visits after initial 8 visits  Authorization Visit#: 5 of 8   Subjective: Symptoms/Limitations Symptoms: Pain scale 2/10 with squats and twisting.  Pt stated he has been without brace on knee since last week.  notes continued Rt foot pain.  Pain Assessment Currently in Pain?: Yes Pain Score: 2  Pain Location: Knee Pain Orientation: Right Pain Type: Surgical pain  Exercise/Treatments Stretches Active Hamstring Stretch: 3 reps;Limitations;20 seconds Active Hamstring Stretch Limitations: 14" Box with same side rotation Knee: Self-Stretch to increase Flexion: Limitations;3 reps;20 seconds Knee: Self-Stretch Limitations: hip flexor streths with same side rotation ITB Stretch: 3 reps;20 seconds Piriformis Stretch: 4 reps;20 seconds Standing Functional Squat Limitations: Squat Matrix 5x Other Standing Knee Exercises: 14" retro 1/2 knee anterior hip drives.   Manual Therapy Manual Therapy: Myofascial release Myofascial Release: Lateral quad, rectus femorixs, ITBand 47minutes  Physical Therapy Assessment and Plan PT Assessment and Plan Clinical Impression Statement: This session focused on decreasing mobility deficits to decrease bilaeral (R>Lt) knee pain during squatting, walking, and stairs. Specificall focused placed on Rt lateral quad, ITband, piriformis and rectusfemoris to decreased lateral devisation of knee durign squatting.  At end of session patient noted decreasedknee pain and demonstrated improved squat depth. PT Plan:  Progressfunctional exercises as tolerated, increasing reps for squatting and adding lunging exercises.  Increased mobility next session with lateral quad, piriformis,ITB, stretch to assist with preventig lateral deviation with squats.      Goals PT Short Term Goals PT Short Term Goal 1: Patient will display increased ankle dorsiflexion of 20 decrees indicating decreased early heel off during gait. PT Short Term Goal 1 - Progress: Progressing toward goal PT Short Term Goal 2: Patient will display increased hip internal rotation of 20 degrees on right to improve shock absrorbtion mechanisms during gait. PT Short Term Goal 2 - Progress: Progressing toward goal PT Short Term Goal 3: Patient will display increased hip mobility py performing negative thomas, piriformis, and obers test to improve ambulation tolerance.  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  PT Short Term Goal 4 - Progress: Progressing toward goal  Problem List Patient Active Problem List   Diagnosis Date Noted  . Difficulty in walking(719.7) 02/17/2014  . Stiffness of joint, not elsewhere classified, lower leg 02/17/2014  . Stiffness of joint, not elsewhere classified, ankle and foot 02/17/2014  . Muscle weakness (generalized) 02/17/2014  . Derangement of posterior horn of medial meniscus 02/03/2014  . Plantar fasciitis of left foot 01/18/2014  . Type II or unspecified type diabetes mellitus without mention of complication, not stated as uncontrolled 09/18/2013    PT - End of Session Activity Tolerance: Patient tolerated treatment well General Behavior During Therapy: [facility] for tasks assessed/performed  GP    Peregrine Nolt R 03/04/2014, 12:25 PM

## 2014-03-08 ENCOUNTER — Ambulatory Visit (HOSPITAL_COMMUNITY)
Admission: RE | Admit: 2014-03-08 | Discharge: 2014-03-08 | Disposition: A | Discharge status: Home / Self Care | Payer: BC Managed Care – PPO | Source: Ambulatory Visit | Attending: Orthopedic Surgery | Admitting: Orthopedic Surgery

## 2014-03-08 DIAGNOSIS — Z5189 Encounter for other specified aftercare: Secondary | ICD-10-CM | POA: Diagnosis not present

## 2014-03-08 NOTE — Progress Notes (Signed)
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 pain in Rt foot feels te same over the last 3 months Pain Assessment Currently in Pain?: Yes Pain Score: 2  Pain Location: Knee Pain Orientation: Right Pain Type: Surgical pain  Exercise/Treatment Stretches Active Hamstring Stretch: 3 reps;Limitations;20 seconds Active Hamstring Stretch Limitations: 14" Box with same side rotation Quad Stretch Limitations: 14" retro half knee to airex pad 10x 10seconds Knee: Self-Stretch to increase Flexion: Limitations;3 reps;20 seconds Knee: Self-Stretch Limitations: hip flexor streths with same side rotation ITB Stretch: 3 reps;20 seconds Piriformis Stretch: 4 reps;20 seconds Aerobic Stationary Bike: Nustep: 8", Lvl 5 intervals Standing Forward Lunges: 3 sets;10 reps Forward Lunges Limitations: 8" with 3 way reach (do every session until discontinued by PT) Side Lunges: Both;10 reps;3 sets;Limitations Side Lunges Limitations: 8in step with opposite side reach  Functional Squat Limitations: Squat Matrix 5x, no internal rotation due to pain.  Other Standing Knee Exercises: 14" retro 1/2 knee anterior hip drives.  Walking frontal plane hip excursions 300 ft.   Physical Therapy Assessment and Plan PT Assessment and Plan Clinical Impression Statement: This session conitnued focused on decreasing mobility deficits  to decrease bilaeral (Rt>Lt) knee pain during squatting, walking, and stairs. Specificall focused placed on Rt lateral quad, ITband, piriformis and rectusfemoris to decreased lateral deviation of knee during squatting. At end of session patient noted decreased knee pain and demonstrated improved squat depth. follwoign stretches patient mabulated with excessive stride width, that improved follwoign frontal plane walking hip excursion.   PT Plan: Progress functional exercises as tolerated, increasing reps variety for squatting and lunging exercises.  Increased mobility next session with lateral quad, piriformis,ITB, stretch to assist with preventig lateral deviation with squats.  maual therapy focusing on decreasign myofascial restrictions.     Goals PT Short Term Goals PT Short Term Goal 1: Patient will display increased ankle dorsiflexion of 20 decrees indicating decreased early heel off during gait. PT Short Term Goal 1 - Progress: Progressing toward goal PT Short Term Goal 2: Patient will display increased hip internal rotation of 20 degrees on right to improve shock absrorbtion mechanisms during gait. PT Short Term Goal 2 - Progress: Progressing toward goal PT Short Term Goal 3: Patient will display increased hip mobility py performing negative thomas, piriformis, and obers test to improve ambulation tolerance.  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  PT Short Term Goal 4 - Progress: Progressing toward goal  Problem List Patient Active Problem List   Diagnosis Date Noted  . Difficulty in walking(719.7) 02/17/2014  . Stiffness of joint, not elsewhere classified, lower leg 02/17/2014  . Stiffness of joint, not elsewhere classified, ankle and foot 02/17/2014  . Muscle weakness (generalized) 02/17/2014  . Derangement of  posterior horn of medial  meniscus 02/03/2014  . Plantar fasciitis of left foot 01/18/2014  . Type II or unspecified type diabetes mellitus without mention of complication, not stated as uncontrolled 09/18/2013    PT - End of Session Activity Tolerance: Patient tolerated treatment well General Behavior During Therapy: [facility] for tasks assessed/performed  GP    Alexanderjames Berg R 03/08/2014, 12:05 PM

## 2014-03-10 ENCOUNTER — Ambulatory Visit (INDEPENDENT_AMBULATORY_CARE_PROVIDER_SITE_OTHER): Payer: BC Managed Care – PPO | Admitting: Orthopedic Surgery

## 2014-03-10 ENCOUNTER — Ambulatory Visit (HOSPITAL_COMMUNITY)
Admission: RE | Admit: 2014-03-10 | Discharge: 2014-03-10 | Disposition: A | Discharge status: Home / Self Care | Payer: BC Managed Care – PPO | Source: Ambulatory Visit | Attending: Orthopedic Surgery | Admitting: Orthopedic Surgery

## 2014-03-10 ENCOUNTER — Encounter: Payer: Self-pay | Admitting: Orthopedic Surgery

## 2014-03-10 VITALS — BP 131/88 | Ht 75.0 in | Wt 242.0 lb

## 2014-03-10 DIAGNOSIS — M23321 Other meniscus derangements, posterior horn of medial meniscus, right knee: Secondary | ICD-10-CM

## 2014-03-10 DIAGNOSIS — M1711 Unilateral primary osteoarthritis, right knee: Secondary | ICD-10-CM

## 2014-03-10 DIAGNOSIS — Z5189 Encounter for other specified aftercare: Secondary | ICD-10-CM | POA: Diagnosis not present

## 2014-03-10 DIAGNOSIS — Z9889 Other specified postprocedural states: Secondary | ICD-10-CM

## 2014-03-10 NOTE — Progress Notes (Signed)
Physical Therapy Treatment Patient Details  Name: Paul Barnes MRN: [id number] Date of Birth: 03-31-1966  Today's Date: 03/10/2014 Time: 5732-2025 PT Time Calculation (min): 40 min   Charges: TE 4270-6237, man 1415-1425 Visit#: 8 of 20  Re-eval: 03/19/14 Assessment Diagnosis: Rt knee pain s/p arthroscopic surgery  Surgical Date: 02/11/14 Next MD Visit: Aline Brochure 03/14/14 Prior Therapy: no  Authorization: [employer] call # on intake sheet for more visits after initial 8 visits  Authorization Visit#: 8 of 8   Subjective: Symptoms/Limitations Symptoms: Pain in Rt knee this session noted improvemnt after last session but severe soreness following.  Pain Assessment Currently in Pain?: Yes Pain Score: 3  Pain Location: Knee Pain Orientation: Right Pain Type: Surgical pain  Exercise/Treatments Stretches Active Hamstring Stretch: 3 reps;Limitations;20 seconds Active Hamstring Stretch Limitations: 14" Box with same side rotation Quad Stretch: 4 reps;20 seconds Quad Stretch Limitations: standing.  Knee: Self-Stretch Limitations: groin stretch durign lunge to 14" box 2x 20" ITB Stretch: 3 reps;20 seconds Standing Forward Lunges: 3 sets;10 reps Forward Lunges Limitations: 6" with 3 way reach (do every session until discontinued by PT) Functional Squat Limitations: Squat walk aroundSquat Matrix 5x, no internal rotation due to pain.  Other Standing Knee Exercises: 2D walking hip excursion 40ft each  Manual Therapy Myofascial Release: Lateral quad, rectus femorixs, ITBand, groin, 58minutes  Physical Therapy Assessment and Plan PT Assessment and Plan Clinical Impression Statement: This session continued focused on decreasing mobility deficits to decrease bilaeral (Rt>Lt) knee pain during squatting, walking, and stairs. Specifically focus placed on Rt lateral quad, ITband, piriformis and rectus-femoris to decreased lateral deviation of knee during squatting. At end of session patient  noted decreased knee pain and demonstrated improved squat depth. Patient was able to tolerate increased depth of 3-way static lunge reach but continued to have pain with internal rotation squats. PT Plan: Progress functional exercises as tolerated, increasing reps variety for squatting and lunging exercises to improve tolerance form knee twisting as able. Next session contiue to progress with lateral quad, piriformis,ITB, stretch to assist with preventig lateral deviation with squats.  maual therapy focusing on decreasign myofascial restrictions as needed.     Goals PT Short Term Goals PT Short Term Goal 1: Patient will display increased ankle dorsiflexion of 20 decrees indicating decreased early heel off during gait. PT Short Term Goal 1 - Progress: Progressing toward goal PT Short Term Goal 2: Patient will display increased hip internal rotation of 20 degrees on right to improve shock absrorbtion mechanisms during gait. PT Short Term Goal 2 - Progress: Progressing toward goal PT Short Term Goal 3: Patient will display increased hip mobility py performing negative thomas, piriformis, and obers test to improve ambulation tolerance.  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  PT Short Term Goal 4 - Progress: Progressing toward goal PT Long Term Goals PT Long Term Goal 1: Patient will display increased hip internal rotation of >30 degrees on right to improve shock absrorbtion mechanisms during gait PT Long Term Goal 1 - Progress: Progressing toward goal PT Long Term Goal 2: Patient will display increased hamstring strength to 4/5 MMT to be able to better cotrol knee twisting during gait.  PT Long Term Goal 2 - Progress: Progressing toward goal Long Term Goal 3: Patient will displasy increased glut  max/med strength of >4-/5 to be  able to better balance on Rt LE and contol knee stability during gait.  Long Term Goal 3 Progress: Progressing toward goal Long Term Goal 4: Patient will be bale to walk > 30 minutes withtout pain.  Long Term Goal 4 Progress: Progressing toward goal  Problem List Patient Active Problem List   Diagnosis Date Noted  . Difficulty in walking(719.7) 02/17/2014  . Stiffness of joint, not elsewhere classified, lower leg 02/17/2014  . Stiffness of joint, not elsewhere classified, ankle and foot 02/17/2014  . Muscle weakness (generalized) 02/17/2014  . Derangement of posterior horn of medial meniscus 02/03/2014  . Plantar fasciitis of left foot 01/18/2014  . Type II or unspecified type diabetes mellitus without mention of complication, not stated as uncontrolled 09/18/2013    PT - End of Session Activity Tolerance: Patient tolerated treatment well General Behavior During Therapy: [facility] for tasks assessed/performed  GP    Taneah Masri R 03/10/2014, 2:34 PM

## 2014-03-10 NOTE — Patient Instructions (Signed)
Stop physical therapy  Start home exercises  Out of work thru Nov 2nd

## 2014-03-11 ENCOUNTER — Encounter: Payer: Self-pay | Admitting: Orthopedic Surgery

## 2014-03-11 NOTE — Progress Notes (Signed)
Chief Complaint  Patient presents with  . Follow-up    post op #2 SARK +MM, DOS 02/11/14    BP 131/88  Ht 6\' 3"  (1.905 m)  Wt 242 lb (109.77 kg)  BMI 30.25 kg/m2  Patient reports that his knee seems to be worsening and he relates it to his increased therapy. So I back off on that. He mainly has bone pain over the medial femoral condyle. He did have osteoarthritis in that area at the time of surgery.  I've given him home exercises to do and he is physical therapy.  He has tenderness over the medial femoral condyle and tibial plateau. Has full flexion mild effusion. He ambulates without assistive device and there is no limp. He does complain of difficulties with squatting  Recommend home exercises stop physical therapy return to me in about 3 weeks  We extended is out of work status and we will reevaluate when he comes back. He is also to start Aleve one tablet once a day until I see him in 3 weeks

## 2014-03-14 ENCOUNTER — Ambulatory Visit: Payer: BC Managed Care – PPO | Admitting: Orthopedic Surgery

## 2014-03-15 ENCOUNTER — Ambulatory Visit (HOSPITAL_COMMUNITY): Payer: BC Managed Care – PPO

## 2014-03-17 ENCOUNTER — Ambulatory Visit (HOSPITAL_COMMUNITY): Payer: BC Managed Care – PPO | Admitting: Physical Therapy

## 2014-03-21 ENCOUNTER — Ambulatory Visit: Payer: BC Managed Care – PPO | Admitting: Orthopedic Surgery

## 2014-03-22 ENCOUNTER — Ambulatory Visit (HOSPITAL_COMMUNITY): Payer: BC Managed Care – PPO | Admitting: Physical Therapy

## 2014-03-23 NOTE — Progress Notes (Addendum)
  Patient Details  Name: Paul Barnes MRN: [id number] Date of Birth: December 30, 1965  Today's Date: 03/23/2014  Patient discharged per MD recommendations.  Ahana Najera R 03/23/2014, 5:54 PM

## 2014-03-24 ENCOUNTER — Ambulatory Visit (HOSPITAL_COMMUNITY): Payer: BC Managed Care – PPO | Admitting: Physical Therapy

## 2014-03-28 NOTE — Progress Notes (Signed)
SBNR

## 2014-03-30 ENCOUNTER — Ambulatory Visit (HOSPITAL_COMMUNITY): Payer: BC Managed Care – PPO

## 2014-03-31 ENCOUNTER — Ambulatory Visit: Payer: BC Managed Care – PPO | Admitting: Orthopedic Surgery

## 2014-04-01 ENCOUNTER — Ambulatory Visit (HOSPITAL_COMMUNITY): Payer: BC Managed Care – PPO

## 2014-04-04 ENCOUNTER — Encounter: Payer: Self-pay | Admitting: Orthopedic Surgery

## 2014-04-04 ENCOUNTER — Ambulatory Visit (INDEPENDENT_AMBULATORY_CARE_PROVIDER_SITE_OTHER): Payer: BC Managed Care – PPO | Admitting: Orthopedic Surgery

## 2014-04-04 VITALS — BP 124/82 | Ht 75.0 in | Wt 242.0 lb

## 2014-04-04 DIAGNOSIS — M23321 Other meniscus derangements, posterior horn of medial meniscus, right knee: Secondary | ICD-10-CM

## 2014-04-04 DIAGNOSIS — Z9889 Other specified postprocedural states: Secondary | ICD-10-CM

## 2014-04-04 DIAGNOSIS — M1711 Unilateral primary osteoarthritis, right knee: Secondary | ICD-10-CM

## 2014-04-04 NOTE — Progress Notes (Signed)
Patient ID: KARAS PICKERILL, male   DOB: 02/02/1966, 48 y.o.   MRN: [id number] Chief Complaint  Patient presents with  . Follow-up    3 week recheck Right knee, SARK 02/11/14    BP 124/82 mmHg  Ht 6\' 3"  (1.905 m)  Wt 242 lb (109.77 kg)  BMI 30.25 kg/m2  The patient is now 8 weeks after his arthroscopy of his right knee and medial meniscectomy and chondroplasty of the medial femoral condyle he still complains of pain over the medial femoral condyle. He also has some pain in the posterior medial corner of the knee and he has pain with his McMurray's sign.  He is on Aleve and alternate with ibuprofen  He has full extension of his knee full flexion no swelling or tenderness over the medial femoral condyle medial tibial plateau he does not have a limp but he has trouble squatting  At this point there is not really much else I can do other than perhaps an injection of cortisone he would like to stay out of work a little longer to see if he can improve his squatting with his home exercise program and I'm agreeable to that  He is out of work through the 23rd with appointment here he will continue Aleve and ibuprofen

## 2014-04-04 NOTE — Patient Instructions (Signed)
OUT OF WORK THROUGH 04/25/14

## 2014-04-05 ENCOUNTER — Telehealth: Payer: Self-pay | Admitting: Orthopedic Surgery

## 2014-04-05 ENCOUNTER — Ambulatory Visit (HOSPITAL_COMMUNITY): Payer: BC Managed Care – PPO | Admitting: Physical Therapy

## 2014-04-05 NOTE — Telephone Encounter (Signed)
Notes, date of service 04/04/14, faxed to [name], short-term disability insurer to fax# [phone]; authorization on file.

## 2014-04-07 ENCOUNTER — Ambulatory Visit (HOSPITAL_COMMUNITY): Payer: BC Managed Care – PPO

## 2014-04-12 ENCOUNTER — Ambulatory Visit (HOSPITAL_COMMUNITY): Payer: BC Managed Care – PPO | Admitting: Physical Therapy

## 2014-04-14 ENCOUNTER — Ambulatory Visit (HOSPITAL_COMMUNITY): Payer: BC Managed Care – PPO | Admitting: Physical Therapy

## 2014-04-15 ENCOUNTER — Telehealth: Payer: Self-pay | Admitting: Orthopedic Surgery

## 2014-04-15 NOTE — Telephone Encounter (Signed)
Dr. Aline Brochure please call Dr. Ebony Hail at [phone] regarding Paul Barnes to discuss Disability he has tried to call you twice already.

## 2014-04-25 ENCOUNTER — Encounter: Payer: Self-pay | Admitting: Orthopedic Surgery

## 2014-04-25 ENCOUNTER — Ambulatory Visit (INDEPENDENT_AMBULATORY_CARE_PROVIDER_SITE_OTHER): Payer: BC Managed Care – PPO | Admitting: Orthopedic Surgery

## 2014-04-25 VITALS — BP 138/92 | Ht 75.0 in | Wt 242.0 lb

## 2014-04-25 DIAGNOSIS — Z9889 Other specified postprocedural states: Secondary | ICD-10-CM

## 2014-04-25 DIAGNOSIS — M722 Plantar fascial fibromatosis: Secondary | ICD-10-CM

## 2014-04-25 NOTE — Progress Notes (Signed)
Patient ID: MARJORIE LUSSIER, male   DOB: September 22, 1965, 48 y.o.   MRN: [id number] Problem #1  Chief Complaint  Patient presents with  . Follow-up    3 week recheck right knee, SARK, DOS 02/11/14    The patient says his knee is better he is able to kneel but still has difficulty getting up and would like more time. He is using Tylenol arthritis and ibuprofen over Aleve. Aleve is not helping as much.  Problem #2 New problem  Pain right heel  History of chronic right heel pain previously treated with injection and Cam Walker several years ago. He has not improved after rest while he is recovering from his knee surgery  He denies any numbness or tingling but has tenderness and pain over the plantar aspect of his right heel  BP 138/92 mmHg  Ht 6\' 3"  (1.905 m)  Wt 242 lb (109.77 kg)  BMI 30.25 kg/m2  He is awake alert and oriented 3 mood and affect are normal he is ambulatory with no assistive devices he has tenderness in the plantar aspect of his heel but normal range of motion at his ankle. Skin looks clean dry and intact.  Status post arthroscopy right knee continue out of work status until December 22; can return to work been  Problem #2 plantar fasciitis start Cam Walker continue ice and stretching [phone]

## 2014-04-25 NOTE — Patient Instructions (Signed)
WORK NOTE THROUGH DEC 22

## 2014-05-04 ENCOUNTER — Telehealth: Payer: Self-pay | Admitting: Orthopedic Surgery

## 2014-05-04 NOTE — Telephone Encounter (Signed)
Notes, date of service 04/25/14, faxed to [name], short-term disability insurer to fax# [phone]; authorization on file.

## 2014-05-23 ENCOUNTER — Ambulatory Visit (INDEPENDENT_AMBULATORY_CARE_PROVIDER_SITE_OTHER): Payer: BC Managed Care – PPO | Admitting: Orthopedic Surgery

## 2014-05-23 ENCOUNTER — Encounter: Payer: Self-pay | Admitting: Orthopedic Surgery

## 2014-05-23 VITALS — BP 137/71 | Ht 75.0 in | Wt 242.0 lb

## 2014-05-23 DIAGNOSIS — Z9889 Other specified postprocedural states: Secondary | ICD-10-CM

## 2014-05-23 DIAGNOSIS — M722 Plantar fascial fibromatosis: Secondary | ICD-10-CM

## 2014-05-23 NOTE — Progress Notes (Signed)
Chief Complaint  Patient presents with  . Follow-up    4 week recheck right knee, SARK, DOS 02/11/14    The patient had knee surgery and September 2015 is out of the postop period still having trouble with his plantar fasciitis with heel pain no numbness or tingling pain has moved more medial although is still plantar posterior and inferior.  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  He's improved his ability to squat but cannot kneel and squat on a consistent basis  Exam he is foot has normal alignment is tender over the plantar fascia and medial aspect of his heel Achilles tendon nontender range of motion normal ankle stable motor exam intact skin intact pulses good sensation normal  His knee shows a negative McMurray'S, very little tenderness over the medial joint line although he has tenderness over the medial femoral condyle and his operative report does show that he had chondromalacia they are  Recommend injection of the he'll continue Cam Walker follow-up 4 weeks continue to work  Procedure note  Injection  Verbal consent was obtained to inject the  right plantar fascia  Timeout procedure was completed to confirm injection site  Diagnosis plantar fasciitis  Medications used Depo-Medrol 40 mg 1 cc Lidocaine 1% plain 3 cc  Anesthesia was provided by ethyl chloride spray  Prep was performed with alcohol  Technique of injection; the medication was injected using sterile technique   No complications were noted

## 2014-05-23 NOTE — Patient Instructions (Signed)
oow x 4 weeks   Joint Injection Care After Refer to this sheet in the next few days. These instructions provide you with information on caring for yourself after you have had a joint injection. Your caregiver also may give you more specific instructions. Your treatment has been planned according to current medical practices, but problems sometimes occur. Call your caregiver if you have any problems or questions after your procedure. After any type of joint injection, it is not uncommon to experience:  Soreness, swelling, or bruising around the injection site.  Mild numbness, tingling, or weakness around the injection site caused by the numbing medicine used before or with the injection. It also is possible to experience the following effects associated with the specific agent after injection:  Iodine-based contrast agents:  Allergic reaction (itching, hives, widespread redness, and swelling beyond the injection site).  Corticosteroids (These effects are rare.):  Allergic reaction.  Increased blood sugar levels (If you have diabetes and you notice that your blood sugar levels have increased, notify your caregiver).  Increased blood pressure levels.  Mood swings.  Hyaluronic acid in the use of viscosupplementation.  Temporary heat or redness.  Temporary rash and itching.  Increased fluid accumulation in the injected joint. These effects all should resolve within a day after your procedure.  HOME CARE INSTRUCTIONS  Limit yourself to light activity the day of your procedure. Avoid lifting heavy objects, bending, stooping, or twisting.  Take prescription or over-the-counter pain medication as directed by your caregiver.  You may apply ice to your injection site to reduce pain and swelling the day of your procedure. Ice may be applied 03-04 times:  Put ice in a plastic bag.  Place a towel between your skin and the bag.  Leave the ice on for no longer than 15-20 minutes each  time. SEEK IMMEDIATE MEDICAL CARE IF:   Pain and swelling get worse rather than better or extend beyond the injection site.  Numbness does not go away.  Blood or fluid continues to leak from the injection site.  You have chest pain.  You have swelling of your face or tongue.  You have trouble breathing or you become dizzy.  You develop a fever, chills, or severe tenderness at the injection site that last longer than 1 day. MAKE SURE YOU:  Understand these instructions.  Watch your condition.  Get help right away if you are not doing well or if you get worse. Document Released: 01/31/2011 Document Revised: 08/12/2011 Document Reviewed: 01/31/2011 [facility] Patient Information 2015 [place], [place]. This information is not intended to replace advice given to you by your health care provider. Make sure you discuss any questions you have with your health care provider.

## 2014-06-21 ENCOUNTER — Encounter: Payer: Self-pay | Admitting: Orthopedic Surgery

## 2014-06-21 ENCOUNTER — Ambulatory Visit (INDEPENDENT_AMBULATORY_CARE_PROVIDER_SITE_OTHER): Payer: BLUE CROSS/BLUE SHIELD | Admitting: Orthopedic Surgery

## 2014-06-21 VITALS — BP 131/82 | Ht 75.0 in | Wt 242.0 lb

## 2014-06-21 DIAGNOSIS — M722 Plantar fascial fibromatosis: Secondary | ICD-10-CM

## 2014-06-21 DIAGNOSIS — Z9889 Other specified postprocedural states: Secondary | ICD-10-CM

## 2014-06-21 NOTE — Progress Notes (Signed)
Chief Complaint  Patient presents with  . Follow-up    recheck right knee s/p SARK 02/11/14 + recheck right foot  . Foot Pain    Plantar fasciitis right foot follow-up    The patient is finally made good recovery regarding his right knee no pain no swelling, improved squatting bending kneeling and returning to standing position  As far as his right foot goes still having pain in the morning and limping when he gets out of bed. He's had Cam Walker, exercise program, ice treatment, injection no improvement except when he wears cowboy boots.  As we discussed previously he can have arthroscopic or plantar fascial release he wants to think it over.  We should note that his mom's chemotherapy for lung cancer has been completed on the first round and she did well with that but his dad has to have surgery now on his heart  He will call us regarding a work note and regarding surgical preference arthroscopic versus plantar fascia open release. We want him to see Dr. Caprice Beaver if he wants the plantar fascia released arthroscopically.

## 2014-06-21 NOTE — Patient Instructions (Signed)
Call when you decide about surgery vs referral to Dr Caprice Beaver

## 2014-06-22 ENCOUNTER — Telehealth: Payer: Self-pay | Admitting: Orthopedic Surgery

## 2014-06-22 ENCOUNTER — Encounter: Payer: Self-pay | Admitting: Orthopedic Surgery

## 2014-06-22 NOTE — Telephone Encounter (Signed)
Patient has called back to relay that he has decided to be referred out regarding the foot surgery, as discussed at office visit yesterday, 06/21/14, to Dr. Caprice Beaver.  He has requested an updated work note, which has been done.  Please advise patient.  Ph# [phone].

## 2014-06-27 ENCOUNTER — Telehealth: Payer: Self-pay | Admitting: *Deleted

## 2014-06-27 ENCOUNTER — Other Ambulatory Visit: Payer: Self-pay | Admitting: *Deleted

## 2014-06-27 DIAGNOSIS — M722 Plantar fascial fibromatosis: Secondary | ICD-10-CM

## 2014-06-27 NOTE — Telephone Encounter (Signed)
NOTED, REFERRAL SENT

## 2014-06-27 NOTE — Telephone Encounter (Signed)
REFERRAL FAXED TO DR Caprice Beaver

## 2014-07-11 NOTE — Telephone Encounter (Signed)
Patient has appt 2/12 @ 11:15

## 2014-08-16 ENCOUNTER — Other Ambulatory Visit: Payer: Self-pay | Admitting: Nurse Practitioner

## 2014-09-22 NOTE — Progress Notes (Signed)
This encounter was created in error - please disregard.

## 2014-10-18 ENCOUNTER — Encounter: Payer: Self-pay | Admitting: Orthopedic Surgery

## 2014-10-18 ENCOUNTER — Ambulatory Visit (INDEPENDENT_AMBULATORY_CARE_PROVIDER_SITE_OTHER): Payer: BLUE CROSS/BLUE SHIELD | Admitting: Orthopedic Surgery

## 2014-10-18 VITALS — BP 136/84 | Ht 75.0 in | Wt 249.0 lb

## 2014-10-18 DIAGNOSIS — M1711 Unilateral primary osteoarthritis, right knee: Secondary | ICD-10-CM | POA: Diagnosis not present

## 2014-10-18 DIAGNOSIS — Z9889 Other specified postprocedural states: Secondary | ICD-10-CM | POA: Diagnosis not present

## 2014-10-18 DIAGNOSIS — M722 Plantar fascial fibromatosis: Secondary | ICD-10-CM

## 2014-10-18 NOTE — Progress Notes (Signed)
Subjective:     Patient ID: Paul Barnes, male   DOB: September 30, 1965, 49 y.o.   MRN: [id number]  Knee Pain    Chief Complaint  Patient presents with  . Knee Pain    recurring right knee pain s/p SARK 02/11/14    History the patient had arthroscopy of his knee had a medial meniscal tear with the partial meniscectomy also had some chondromalacia was medial femoral condyle. He's in treatment now for plantar fasciitis with a podiatrist locally. He still having some difficulty with climbing steps and loadbearing activities. No swelling. His mechanical symptoms have resolved. No neurologic deficits that he complains of.  Review of Systems Normal otherwise review of systems    Objective:   Physical Exam Seems to walk normally without limp he has medial joint line tenderness negative McMurray sign full range of motion no swelling knee is stable motor exam is normal scans intact neurovascular exam    Assessment:     Most likely arthritic pain.    Plan:     Recommend he stop squats he can do straight leg raises terminal knee extension quad sets to build his quadricep muscles. He can do some closed chain exercises at 45-0. He will have the foot surgery and then let me know if his knee still bothers him at that point we can try cortisone injections or hyaluronic acid injection knee bracing with a valgus bracing may help

## 2014-10-24 ENCOUNTER — Other Ambulatory Visit: Payer: Self-pay | Admitting: Family Medicine

## 2014-10-25 NOTE — Telephone Encounter (Signed)
Whoa not seen nine mo for diab, ref times one and rec o v

## 2014-11-02 ENCOUNTER — Other Ambulatory Visit: Payer: Self-pay | Admitting: Podiatry

## 2014-11-03 NOTE — Patient Instructions (Signed)
Paul Barnes  11/03/2014     Your procedure is scheduled on 11/09/14.  Report to [facility] at 07:00 A.M.  Call this number if you have problems the morning of surgery:  [phone]   Remember:  Do not eat food or drink liquids after midnight.  Take these medicines the morning of surgery with A SIP OF WATER    Do not wear jewelry, make-up or nail polish.  Do not wear lotions, powders, or perfumes.  You may wear deodorant.  Do not shave 48 hours prior to surgery.  Men may shave face and neck.  Do not bring valuables to the hospital.  [facility] is not responsible for any belongings or valuables.  Contacts, dentures or bridgework may not be worn into surgery.  Leave your suitcase in the car.  After surgery it may be brought to your room.  For patients admitted to the hospital, discharge time will be determined by your treatment team.  Patients discharged the day of surgery will not be allowed to drive home.   Special instructions:  Shower using Hibiclens (CHG bath) the night before surgery and the morning of surgery.  Please read over the following fact sheets that you were given. Anesthesia Post-op Instructions and Care and Recovery After Surgery    Endoscopic Plantar Fasciotomy On the underside of the foot and heel is a tight band of tissue called the plantar fascia. Sometimes the plantar fascia become inflamed (the body's way of reacting to injury, overuse or infection) which produces pain. The condition is known as plantar fasciitis.  One way to treat plantar fasciitis is through an endoscopic plantar fasciotomy. This is surgery to reduce the tension on the plantar fascia. However, it is a minimally invasive surgery because there will be no large incision. Instead, the surgeon inserts a thin, flexible tube through a small (1/16th of an inch (1.59 mm)) cut in your skin. The surgeon can examine and release the fascia through this tube. Recovery from an endoscopic fasciotomy  is usually less painful and faster than from open surgery. LET YOUR CAREGIVER KNOW ABOUT:  Any allergies.  All medications you are taking, including:  Herbs, eyedrops, over-the-counter medications and creams.  Blood thinners (anticoagulants) or other drugs that could affect blood clotting.  Use of steroids (by mouth or as creams).  Previous problems with anesthetics, including local anesthetics.  Possibility of pregnancy, if this applies.  Any history of blood clots.  Any history of bleeding or other blood problems.  Previous surgery.  Smoking history.  Other health problems.  Family history of anesthetic problems RISKS AND COMPLICATIONS  Short-term possibilities include:  Excessive bleeding.  Pain.  Loss of feeling (numbness) at the site of the incision.  Hematoma, a pooling of blood in the wound.  Infection.  Slow resolution of the symptoms. Longer-term possibilities include:  Scarring.  A return of the condition that led to fasciotomy.  Damage to nerves in the area.  Weakness in your foot.  Need for additional surgery. BEFORE THE PROCEDURE  Ask whether you need to get shoes that will support your heel and arch while you recover.  7 to 10 days before the surgery, stop using aspirin and non-steroidal anti-inflammatory drugs (NSAIDs) for pain relief. This includes prescription drugs and over-the-counter drugs such as ibuprofen and naproxen.  If you take blood-thinners, ask your healthcare provider when you should stop taking them.  Do not eat or drink for about 8 hours before your surgery.  You might be asked to shower or wash with a special antibacterial soap before the procedure.  Arrive 1-2 hours before the surgery, or whenever your surgeon recommends. This will give you time to check in and fill out any needed paperwork.  If your surgery is an outpatient procedure, you will be able to go home the same day. Make arrangements in advance for someone  to drive you home. PROCEDURE  You may be given general anesthesia (you will be asleep), regional anesthesia (your leg will be numbed) or local anesthesia (just the area around the fascia will be numbed). With regional and local anesthesia you will be given medication to make you groggy but awake during the procedure.  Your foot will be cleaned and sterilized.  The surgeon will make a cut (incision) on the side of your heel. Then a thin tube that contains a tiny camera will be inserted into the space. The camera makes it possible for the surgeon to see what is happening inside your foot.  The surgeon will work through this tube to release the fascia.  The tube will be removed, and dressing will be applied to the incisions. AFTER THE PROCEDURE After the procedure, you will be taken to another room to recover. People usually go home the same day. Before leaving, make sure you have detailed instructions on how to care for the incision. Also, you may be given crutches and shown how to use them. Ask your surgeon whether physical therapy will be needed.  HOME CARE INSTRUCTIONS   Take any prescription medication for pain and/or nausea that your surgeon prescribes. Follow the directions carefully and take all of the medication.  Ask your surgeon whether you can take over-the-counter medicines for pain, discomfort or fever. Do not take aspirin unless your healthcare provider says to. Aspirin can increase the chances of bleeding.  You may need to put ice on your foot for 10 to 15 minutes each day for several days.  While you are resting, keep your foot elevated above the level of your heart.  Do not get the incisions wet for the first few days after surgery (or until the surgeon tells you it is OK).  Avoid standing or walking for long periods. Your healthcare provider will tell you when you are clear to resume normal activity. If your job requires a lot of standing or walking, ask to be assigned to a  less active position for about 8 weeks.  When you are up and about, wear shoes with a supportive heel and arch support. Soft running shoes may be recommended for the first two weeks of recovery. SEEK MEDICAL CARE IF:   The wound becomes red or swollen.  The wound leaks fluid or blood.  Your pain increases.  You become nauseous or vomit for more than two days after the surgery.  You have pain or difficulty moving your foot.  You develop a fever of more than 100.5 F (38.1 C). SEEK IMMEDIATE MEDICAL CARE IF:   Your leg or foot starts to swell.  You develop a fever of 102.0 F (38.9 C) or higher. Document Released: 03/17/2009 Document Revised: 08/12/2011 Document Reviewed: 03/17/2009 [facility] Patient Information 2015 [place], [place]. This information is not intended to replace advice given to you by your health care provider. Make sure you discuss any questions you have with your health care provider.    PATIENT INSTRUCTIONS POST-ANESTHESIA  IMMEDIATELY FOLLOWING SURGERY:  Do not drive or operate machinery for the first twenty  four hours after surgery.  Do not make any important decisions for twenty four hours after surgery or while taking narcotic pain medications or sedatives.  If you develop intractable nausea and vomiting or a severe headache please notify your doctor immediately.  FOLLOW-UP:  Please make an appointment with your surgeon as instructed. You do not need to follow up with anesthesia unless specifically instructed to do so.  WOUND CARE INSTRUCTIONS (if applicable):  Keep a dry clean dressing on the anesthesia/puncture wound site if there is drainage.  Once the wound has quit draining you may leave it open to air.  Generally you should leave the bandage intact for twenty four hours unless there is drainage.  If the epidural site drains for more than 36-48 hours please call the anesthesia department.  QUESTIONS?:  Please feel free to call your physician or the  hospital operator if you have any questions, and they will be happy to assist you.

## 2014-11-04 ENCOUNTER — Encounter (HOSPITAL_COMMUNITY): Payer: Self-pay

## 2014-11-04 ENCOUNTER — Encounter (HOSPITAL_COMMUNITY)
Admission: RE | Admit: 2014-11-04 | Discharge: 2014-11-04 | Disposition: A | Discharge status: Home / Self Care | Payer: BLUE CROSS/BLUE SHIELD | Source: Ambulatory Visit | Attending: Podiatry | Admitting: Podiatry

## 2014-11-04 ENCOUNTER — Other Ambulatory Visit: Payer: BLUE CROSS/BLUE SHIELD

## 2014-11-04 DIAGNOSIS — M722 Plantar fascial fibromatosis: Secondary | ICD-10-CM | POA: Diagnosis not present

## 2014-11-04 DIAGNOSIS — M79671 Pain in right foot: Secondary | ICD-10-CM | POA: Diagnosis not present

## 2014-11-04 DIAGNOSIS — Z01818 Encounter for other preprocedural examination: Secondary | ICD-10-CM | POA: Diagnosis present

## 2014-11-04 LAB — BASIC METABOLIC PANEL
ANION GAP: 8 (ref 5–15)
BUN: 15 mg/dL (ref 6–20)
CHLORIDE: 105 mmol/L (ref 101–111)
CO2: 27 mmol/L (ref 22–32)
Calcium: 9.1 mg/dL (ref 8.9–10.3)
Creatinine, Ser: 0.98 mg/dL (ref 0.61–1.24)
GFR calc Af Amer: >60 mL/min (ref >60)
GFR calc non Af Amer: >60 mL/min (ref >60)
Glucose, Bld: 196 mg/dL — ABNORMAL HIGH (ref 65–99)
Potassium: 4.4 mmol/L (ref 3.5–5.1)
SODIUM: 140 mmol/L (ref 135–145)

## 2014-11-04 LAB — CBC
HCT: 43.8 % (ref 39.0–52.0)
Hemoglobin: 15.1 g/dL (ref 13.0–17.0)
MCH: 31.9 pg (ref 26.0–34.0)
MCHC: 34.5 g/dL (ref 30.0–36.0)
MCV: 92.4 fL (ref 78.0–100.0)
Platelets: 227 10*3/uL (ref 150–400)
RBC: 4.74 MIL/uL (ref 4.22–5.81)
RDW: 12.4 % (ref 11.5–15.5)
WBC: 6.8 10*3/uL (ref 4.0–10.5)

## 2014-11-09 ENCOUNTER — Encounter (HOSPITAL_COMMUNITY): Payer: Self-pay

## 2014-11-09 ENCOUNTER — Ambulatory Visit (HOSPITAL_COMMUNITY): Payer: BLUE CROSS/BLUE SHIELD | Admitting: Anesthesiology

## 2014-11-09 ENCOUNTER — Encounter (HOSPITAL_COMMUNITY)
Admission: RE | Disposition: A | Discharge status: Home / Self Care | Payer: Self-pay | Source: Ambulatory Visit | Attending: Podiatry

## 2014-11-09 ENCOUNTER — Ambulatory Visit (HOSPITAL_COMMUNITY)
Admission: RE | Admit: 2014-11-09 | Discharge: 2014-11-09 | Disposition: A | Discharge status: Home / Self Care | Payer: BLUE CROSS/BLUE SHIELD | Source: Ambulatory Visit | Attending: Podiatry | Admitting: Podiatry

## 2014-11-09 DIAGNOSIS — E119 Type 2 diabetes mellitus without complications: Secondary | ICD-10-CM | POA: Insufficient documentation

## 2014-11-09 DIAGNOSIS — M722 Plantar fascial fibromatosis: Secondary | ICD-10-CM | POA: Diagnosis not present

## 2014-11-09 HISTORY — PX: PLANTAR FASCIA RELEASE: SHX2239

## 2014-11-09 LAB — GLUCOSE, CAPILLARY
Glucose-Capillary: 252 mg/dL — ABNORMAL HIGH (ref 65–99)
Glucose-Capillary: 218 mg/dL — ABNORMAL HIGH (ref 65–99)

## 2014-11-09 SURGERY — FASCIOTOMY, PLANTAR, ENDOSCOPIC
Anesthesia: Monitor Anesthesia Care | Site: Foot | Laterality: Right

## 2014-11-09 MED ORDER — PROPOFOL INFUSION 10 MG/ML OPTIME
INTRAVENOUS | Status: DC | PRN
  Administered 2014-11-09: 75 ug/kg/min via INTRAVENOUS
  Administered 2014-11-09: 09:00:00 via INTRAVENOUS

## 2014-11-09 MED ORDER — ONDANSETRON HCL 4 MG/2ML IJ SOLN: 4.0000 mg | Freq: Once | INTRAMUSCULAR | Status: DC | PRN

## 2014-11-09 MED ORDER — MIDAZOLAM HCL 2 MG/2ML IJ SOLN
INTRAMUSCULAR | Status: AC
  Filled 2014-11-09: qty 2

## 2014-11-09 MED ORDER — LIDOCAINE HCL (PF) 1 % IJ SOLN
INTRAMUSCULAR | Status: DC | PRN
  Administered 2014-11-09: 10 mL

## 2014-11-09 MED ORDER — ACETAMINOPHEN 325 MG PO TABS
ORAL_TABLET | ORAL | Status: AC
  Filled 2014-11-09: qty 2

## 2014-11-09 MED ORDER — PROPOFOL 10 MG/ML IV BOLUS
INTRAVENOUS | Status: AC
  Filled 2014-11-09: qty 20

## 2014-11-09 MED ORDER — CEFAZOLIN SODIUM-DEXTROSE 2-3 GM-% IV SOLR
2.0000 g | INTRAVENOUS | Status: AC
  Administered 2014-11-09: 2 g via INTRAVENOUS
  Filled 2014-11-09: qty 50

## 2014-11-09 MED ORDER — FENTANYL CITRATE (PF) 100 MCG/2ML IJ SOLN
INTRAMUSCULAR | Status: AC
Start: 2014-11-09 — End: 2014-11-09
  Filled 2014-11-09: qty 2

## 2014-11-09 MED ORDER — FENTANYL CITRATE (PF) 100 MCG/2ML IJ SOLN: 25.0000 ug | INTRAMUSCULAR | Status: DC | PRN

## 2014-11-09 MED ORDER — MIDAZOLAM HCL 5 MG/5ML IJ SOLN
INTRAMUSCULAR | Status: DC | PRN
  Administered 2014-11-09: 2 mg via INTRAVENOUS

## 2014-11-09 MED ORDER — LIDOCAINE HCL (PF) 1 % IJ SOLN
INTRAMUSCULAR | Status: AC
  Filled 2014-11-09: qty 30

## 2014-11-09 MED ORDER — MIDAZOLAM HCL 2 MG/2ML IJ SOLN
1.0000 mg | INTRAMUSCULAR | Status: DC | PRN
  Administered 2014-11-09: 2 mg via INTRAVENOUS

## 2014-11-09 MED ORDER — BSS IO SOLN
INTRAOCULAR | Status: AC
  Filled 2014-11-09: qty 4

## 2014-11-09 MED ORDER — BUPIVACAINE HCL (PF) 0.5 % IJ SOLN
INTRAMUSCULAR | Status: DC | PRN
  Administered 2014-11-09: 20 mL

## 2014-11-09 MED ORDER — FENTANYL CITRATE (PF) 100 MCG/2ML IJ SOLN
INTRAMUSCULAR | Status: DC | PRN
  Administered 2014-11-09 (×4): 25 ug via INTRAVENOUS

## 2014-11-09 MED ORDER — FENTANYL CITRATE (PF) 100 MCG/2ML IJ SOLN
INTRAMUSCULAR | Status: AC
  Filled 2014-11-09: qty 2

## 2014-11-09 MED ORDER — LACTATED RINGERS IV SOLN
INTRAVENOUS | Status: DC
  Administered 2014-11-09: 08:00:00 via INTRAVENOUS

## 2014-11-09 MED ORDER — BUPIVACAINE HCL (PF) 0.5 % IJ SOLN
INTRAMUSCULAR | Status: AC
  Filled 2014-11-09: qty 30

## 2014-11-09 MED ORDER — 0.9 % SODIUM CHLORIDE (POUR BTL) OPTIME
TOPICAL | Status: DC | PRN
  Administered 2014-11-09: 1000 mL

## 2014-11-09 MED ORDER — ACETAMINOPHEN 325 MG PO TABS
650.0000 mg | ORAL_TABLET | Freq: Once | ORAL | Status: AC
  Administered 2014-11-09: 650 mg via ORAL

## 2014-11-09 MED ORDER — FENTANYL CITRATE (PF) 100 MCG/2ML IJ SOLN
25.0000 ug | INTRAMUSCULAR | Status: AC
  Administered 2014-11-09 (×2): 25 ug via INTRAVENOUS

## 2014-11-09 SURGICAL SUPPLY — 32 items
BAG HAMPER (MISCELLANEOUS) ×2 IMPLANT
BANDAGE ELASTIC 3 VELCRO NS (GAUZE/BANDAGES/DRESSINGS) ×2 IMPLANT
BANDAGE ELASTIC 4 VELCRO NS (GAUZE/BANDAGES/DRESSINGS) ×2 IMPLANT
BANDAGE ESMARK 4X12 BL STRL LF (DISPOSABLE) ×1 IMPLANT
BLADE KIT HOOK/TRIANGLE (BLADE) ×2 IMPLANT
BNDG CMPR 12X4 ELC STRL LF (DISPOSABLE) ×1
BNDG ESMARK 4X12 BLUE STRL LF (DISPOSABLE) ×2
BNDG GAUZE ELAST 4 BULKY (GAUZE/BANDAGES/DRESSINGS) ×2 IMPLANT
CHLORAPREP W/TINT 26ML (MISCELLANEOUS) ×2 IMPLANT
COVER LIGHT HANDLE STERIS (MISCELLANEOUS) ×4 IMPLANT
CUFF TOURNIQUET SINGLE 18IN (TOURNIQUET CUFF) ×2 IMPLANT
DRSG ADAPTIC 3X8 NADH LF (GAUZE/BANDAGES/DRESSINGS) ×2 IMPLANT
DURA STEPPER LG (CAST SUPPLIES) ×2 IMPLANT
DURA STEPPER MED (CAST SUPPLIES) IMPLANT
DURA STEPPER SML (CAST SUPPLIES) IMPLANT
DURA STEPPER XL (SOFTGOODS) IMPLANT
ELECT REM PT RETURN 9FT ADLT (ELECTROSURGICAL)
ELECTRODE REM PT RTRN 9FT ADLT (ELECTROSURGICAL) IMPLANT
GAUZE SPONGE 4X4 12PLY STRL (GAUZE/BANDAGES/DRESSINGS) ×2 IMPLANT
GLOVE BIO SURGEON STRL SZ7.5 (GLOVE) ×2 IMPLANT
GOWN STRL REUS W/TWL LRG LVL3 (GOWN DISPOSABLE) ×4 IMPLANT
KIT BLADEGUARD II DBL (SET/KITS/TRAYS/PACK) IMPLANT
KIT ROOM TURNOVER APOR (KITS) ×2 IMPLANT
MARKER SKIN DUAL TIP RULER LAB (MISCELLANEOUS) ×2 IMPLANT
NEEDLE HYPO 18GX1.5 BLUNT FILL (NEEDLE) IMPLANT
NEEDLE HYPO 27GX1-1/4 (NEEDLE) ×6 IMPLANT
NS IRRIG 1000ML POUR BTL (IV SOLUTION) ×2 IMPLANT
PACK BASIC LIMB (CUSTOM PROCEDURE TRAY) ×2 IMPLANT
SET BASIN LINEN APH (SET/KITS/TRAYS/PACK) ×2 IMPLANT
SOLUTION ANTI FOG 6CC (MISCELLANEOUS) ×2 IMPLANT
SUT PROLENE 4 0 PS 2 18 (SUTURE) ×2 IMPLANT
SYR CONTROL 10ML LL (SYRINGE) ×4 IMPLANT

## 2014-11-09 NOTE — Anesthesia Preprocedure Evaluation (Signed)
Anesthesia Evaluation  Patient identified by MRN, date of birth, ID band Patient awake    Reviewed: Allergy & Precautions, H&P , NPO status , Patient's Chart, lab work & pertinent test results  Airway Mallampati: II  TM Distance: >3 FB Neck ROM: Full    Dental  (+) Teeth Intact   Pulmonary neg pulmonary ROS,  breath sounds clear to auscultation        Cardiovascular negative cardio ROS  Rhythm:Regular Rate:Normal     Neuro/Psych    GI/Hepatic negative GI ROS,   Endo/Other  diabetes, Well Controlled, Type 2, Oral Hypoglycemic Agents  Renal/GU      Musculoskeletal   Abdominal   Peds  Hematology   Anesthesia Other Findings   Reproductive/Obstetrics                             Anesthesia Physical Anesthesia Plan  ASA: II  Anesthesia Plan: MAC   Post-op Pain Management:    Induction: Intravenous  Airway Management Planned: Nasal Cannula  Additional Equipment:   Intra-op Plan:   Post-operative Plan:   Informed Consent: I have reviewed the patients History and Physical, chart, labs and discussed the procedure including the risks, benefits and alternatives for the proposed anesthesia with the patient or authorized representative who has indicated his/her understanding and acceptance.     Plan Discussed with:   Anesthesia Plan Comments:         Anesthesia Quick Evaluation

## 2014-11-09 NOTE — Transfer of Care (Signed)
Immediate Anesthesia Transfer of Care Note  Patient: Paul Barnes  Procedure(s) Performed: Procedure(s): ENDOSCOPIC PLANTAR FASCIOTOMY (Right)  Patient Location: PACU  Anesthesia Type:MAC  Level of Consciousness: awake and patient cooperative  Airway & Oxygen Therapy: Patient Spontanous Breathing and Patient connected to face mask oxygen  Post-op Assessment: Report given to RN, Post -op Vital signs reviewed and stable and Patient moving all extremities  Post vital signs: Reviewed and stable  Last Vitals:  Filed Vitals:   11/09/14 0834  BP: 104/62  Pulse:   Temp:   Resp: 14    Complications: No apparent anesthesia complications

## 2014-11-09 NOTE — Discharge Instructions (Signed)
These instructions will give you an idea of what to expect after surgery and how to manage issues that may arise before your first post op office visit.  Pain Management Pain is best managed by staying ahead of it. If pain gets out of control, it is difficult to get it back under control. Local anesthesia that lasts 6-8 hours is used to numb the foot and decrease pain.  For the best pain control, take the pain medication every 4 hours for the first 2 days post op. On the third day pain medication can be taken as needed.   Post Op Nausea Nausea is common after surgery, so it is managed proactively.  If prescribed, use the prescribed nausea medication regularly for the first 2 days post op.  Bandages Do not worry if there is blood on the bandage. What looks like a lot of blood on the bandage is actually a small amount. Blood on the dressing spreads out as it is absorbed by the gauze, the same way a drop of water spreads out on a paper towel.  If the bandages feel wet or dry, stiff and uncomfortable, call the office during office hours and we will schedule a time for you to have the bandage changed.  Unless you are specifically told otherwise, we will do the first bandage change in the office.  Keep your bandage dry. If the bandage becomes wet or soiled, notify the office and we will schedule a time to change the bandage.  Activity It is best to spend most of the first 2 days after surgery lying down with the foot elevated above the level of your heart. You may put weight on your right heel while wearing the cam walker (black boot) to go to the restroom.   You may only get up to go to the restroom.  Driving Do not drive until you are able to respond in an emergency (i.e. slam on the brakes). This usually occurs after the bone has healed - 6 to 8 weeks.  Call the Office If you have a fever over 101F.  If you have increasing pain after the initial post op pain has settled down.  If you have  increasing redness, swelling, or drainage.  If you have any questions or concerns.   Endoscopic Plantar Fasciotomy On the underside of the foot and heel is a tight band of tissue called the plantar fascia. Sometimes the plantar fascia become inflamed (the body's way of reacting to injury, overuse or infection) which produces pain. The condition is known as plantar fasciitis.  One way to treat plantar fasciitis is through an endoscopic plantar fasciotomy. This is surgery to reduce the tension on the plantar fascia. However, it is a minimally invasive surgery because there will be no large incision. Instead, the surgeon inserts a thin, flexible tube through a small (1/16th of an inch (1.59 mm)) cut in your skin. The surgeon can examine and release the fascia through this tube. Recovery from an endoscopic fasciotomy is usually less painful and faster than from open surgery. LET YOUR CAREGIVER KNOW ABOUT: Any allergies. All medications you are taking, including: Herbs, eyedrops, over-the-counter medications and creams. Blood thinners (anticoagulants) or other drugs that could affect blood clotting. Use of steroids (by mouth or as creams). Previous problems with anesthetics, including local anesthetics. Possibility of pregnancy, if this applies. Any history of blood clots. Any history of bleeding or other blood problems. Previous surgery. Smoking history. Other health problems. Family history of  anesthetic problems RISKS AND COMPLICATIONS  Short-term possibilities include: Excessive bleeding. Pain. Loss of feeling (numbness) at the site of the incision. Hematoma, a pooling of blood in the wound. Infection. Slow resolution of the symptoms. Longer-term possibilities include: Scarring. A return of the condition that led to fasciotomy. Damage to nerves in the area. Weakness in your foot. Need for additional surgery. BEFORE THE PROCEDURE Ask whether you need to get shoes that will support  your heel and arch while you recover. 7 to 10 days before the surgery, stop using aspirin and non-steroidal anti-inflammatory drugs (NSAIDs) for pain relief. This includes prescription drugs and over-the-counter drugs such as ibuprofen and naproxen. If you take blood-thinners, ask your healthcare provider when you should stop taking them. Do not eat or drink for about 8 hours before your surgery. You might be asked to shower or wash with a special antibacterial soap before the procedure. Arrive 1-2 hours before the surgery, or whenever your surgeon recommends. This will give you time to check in and fill out any needed paperwork. If your surgery is an outpatient procedure, you will be able to go home the same day. Make arrangements in advance for someone to drive you home. PROCEDURE You may be given general anesthesia (you will be asleep), regional anesthesia (your leg will be numbed) or local anesthesia (just the area around the fascia will be numbed). With regional and local anesthesia you will be given medication to make you groggy but awake during the procedure. Your foot will be cleaned and sterilized. The surgeon will make a cut (incision) on the side of your heel. Then a thin tube that contains a tiny camera will be inserted into the space. The camera makes it possible for the surgeon to see what is happening inside your foot. The surgeon will work through this tube to release the fascia. The tube will be removed, and dressing will be applied to the incisions. AFTER THE PROCEDURE After the procedure, you will be taken to another room to recover. People usually go home the same day. Before leaving, make sure you have detailed instructions on how to care for the incision. Also, you may be given crutches and shown how to use them. Ask your surgeon whether physical therapy will be needed.  HOME CARE INSTRUCTIONS  Take any prescription medication for pain and/or nausea that your surgeon prescribes.  Follow the directions carefully and take all of the medication. Ask your surgeon whether you can take over-the-counter medicines for pain, discomfort or fever. Do not take aspirin unless your healthcare provider says to. Aspirin can increase the chances of bleeding. You may need to put ice on your foot for 10 to 15 minutes each day for several days. While you are resting, keep your foot elevated above the level of your heart. Do not get the incisions wet for the first few days after surgery (or until the surgeon tells you it is OK). Avoid standing or walking for long periods. Your healthcare provider will tell you when you are clear to resume normal activity. If your job requires a lot of standing or walking, ask to be assigned to a less active position for about 8 weeks. When you are up and about, wear shoes with a supportive heel and arch support. Soft running shoes may be recommended for the first two weeks of recovery. SEEK MEDICAL CARE IF:  The wound becomes red or swollen. The wound leaks fluid or blood. Your pain increases. You become nauseous or  vomit for more than two days after the surgery. You have pain or difficulty moving your foot. You develop a fever of more than 100.5 F (38.1 C). SEEK IMMEDIATE MEDICAL CARE IF:  Your leg or foot starts to swell. You develop a fever of 102.0 F (38.9 C) or higher. Document Released: 03/17/2009 Document Revised: 08/12/2011 Document Reviewed: 03/17/2009 [facility] Patient Information 2015 [place], [place]. This information is not intended to replace advice given to you by your health care provider. Make sure you discuss any questions you have with your health care provider.

## 2014-11-09 NOTE — Anesthesia Postprocedure Evaluation (Signed)
  Anesthesia Post-op Note  Patient: Paul Barnes  Procedure(s) Performed: Procedure(s): ENDOSCOPIC PLANTAR FASCIOTOMY (Right)  Patient Location: PACU  Anesthesia Type:MAC  Level of Consciousness: awake, alert , oriented and patient cooperative  Airway and Oxygen Therapy: Patient Spontanous Breathing  Post-op Pain: 3 /10, mild  Post-op Assessment: Post-op Vital signs reviewed, Patient's Cardiovascular Status Stable, Respiratory Function Stable, Patent Airway, No signs of Nausea or vomiting, Pain level controlled and No headache              Post-op Vital Signs: Reviewed and stable  Last Vitals:  Filed Vitals:   11/09/14 0834  BP: 104/62  Pulse:   Temp:   Resp: 14    Complications: No apparent anesthesia complications

## 2014-11-09 NOTE — Op Note (Signed)
OPERATIVE NOTE  DATE OF PROCEDURE:  11/09/2014  SURGEON:  Marcheta Grammes, DPM  OR STAFF:   Circulator: Towanda Malkin, RN Scrub Person: Karin Lieu, CST.   PREOPERATIVE DIAGNOSIS:   1.  Plantar fasciitis, right foot. 2.  Heel pain, right foot.  POSTOPERATIVE DIAGNOSIS:  Same.  PROCEDURE:  Endoscopic plantar fasciotomy, right foot.  ANESTHESIA:  Monitor Anesthesia Care.   HEMOSTASIS:  Pneumatic ankle tourniquet set at 250 mmHg.  ESTIMATED BLOOD LOSS:  Minimal (<5 cc).  MATERIALS USED:  None.  INJECTABLES: 0.5% Marcaine plain and 1% Xylocaine plain.  PATHOLOGY:  None.  COMPLICATIONS:  None.  INDICATIONS: Right heel pain nonresponsive to nonsurgical care.  DESCRIPTION OF THE PROCEDURE:  The patient was brought to the operating room and placed on the operative table in the supine position.  A pneumatic ankle tourniquet was applied to the patient's ankle.  Following sedation, the surgical site was anesthetized with 0.5% Marcaine plain.  The foot was then prepped, scrubbed, and draped in the usual sterile technique.  The foot was elevated, exsanguinated and the pneumatic ankle tourniquet inflated to 250 mmHg.    Attention was directed to the patient's medial right heel, where the medial calcaneal tubercle was palpated. A vertical 1-cm incision was made approximately 2 cm distal from the medial calcaneal tubercle.  The incision was deep into the subcutaneous tissues using blunt dissection.  At this time, a blunt probe was inserted and utilized to identify the boundaries of the plantar fascia.  The obturator and trocar were inserted through this medial incision inferior to the plantar fascia and was transversely directed to the lateral aspect of the heel.  Tenting of the skin on the lateral aspect of the heel was noted.  At this site, a second vertical 1-cm incision was made to allow the exit of the trocar obturator combo.  The trocar was removed, and three to four Q-tips were run  from medial to lateral to remove any fatty deposits or other debris.  The scope was then placed through the obturator from the lateral incision site to visualize the plantar fascia.  The hook blade was placed along the plantar aspect of the patient's medial heel, where the medial half of the plantar fascia was approximated and appropriately marked.  This hook blade was then inserted medially, and the central and medial band of the plantar fascia were carefully transected.  Under endoscopic evaluation, it was noted that the lateral band of the plantar fascia was intact while the medial and central half of the plantar fascia were appropriately released.  The surgical site was then flushed with copious amounts of normal sterile saline.  The surgical site was also injected with 1% Xylocaine plain.  The medial incision site was approximated utilizing 4-0 nylon in a horizontal mattress technique.  The lateral incision site was approximated utilizing 4-0 nylon.  A sterile compressive dressing was applied to the right foot.  The pneumatic ankle tourniquet was deflated and a prompt hyperemic response was noted to all digits of the right foot.   The patient tolerated the procedure well.  The patient was then transferred to PACU with vital signs stable and vascular status intact to all toes of the operative foot.  Following a period of postoperative monitoring, the patient will be discharged home.

## 2014-11-09 NOTE — Brief Op Note (Signed)
OPERATIVE NOTE  DATE OF PROCEDURE:  11/09/2014  SURGEON:  Marcheta Grammes, DPM  OR STAFF:   Circulator: Towanda Malkin, RN Scrub Person: Karin Lieu, CST.   PREOPERATIVE DIAGNOSIS:   1.  Plantar fasciitis, right foot. 2.  Heel pain, right foot.  POSTOPERATIVE DIAGNOSIS:  Same.  PROCEDURE:  Endoscopic plantar fasciotomy, right foot.  ANESTHESIA:  Monitor Anesthesia Care.   HEMOSTASIS:  Pneumatic ankle tourniquet set at 250 mmHg.  ESTIMATED BLOOD LOSS:  Minimal (<5 cc).  MATERIALS USED:  None.  INJECTABLES: 0.5% Marcaine plain and 1% Xylocaine plain.  PATHOLOGY:  None.  COMPLICATIONS:  None.  INDICATIONS: Right heel pain nonresponsive to nonsurgical care.

## 2014-11-09 NOTE — H&P (Signed)
HISTORY AND PHYSICAL INTERVAL NOTE:  11/09/2014  8:23 AM  Paul Barnes  has presented today for surgery, with the diagnosis of plantar fasciitis, right heel pain.  The various methods of treatment have been discussed with the patient.  No guarantees were given.  After consideration of risks, benefits and other options for treatment, the patient has consented to surgery.  I have reviewed the patients' chart and labs.    Patient Vitals for the past 24 hrs:  BP Temp Temp src Pulse Resp SpO2  11/09/14 0739 109/71 mmHg 97.8 F (36.6 C) Oral 66 20 96 %    A history and physical examination was performed in my office.  The patient was reexamined.  There have been no changes to this history and physical examination.  Marcheta Grammes, DPM

## 2014-11-10 ENCOUNTER — Encounter (HOSPITAL_COMMUNITY): Payer: Self-pay | Admitting: Podiatry

## 2014-12-20 ENCOUNTER — Telehealth: Payer: Self-pay | Admitting: Family Medicine

## 2014-12-20 DIAGNOSIS — Z125 Encounter for screening for malignant neoplasm of prostate: Secondary | ICD-10-CM

## 2014-12-20 DIAGNOSIS — E119 Type 2 diabetes mellitus without complications: Secondary | ICD-10-CM

## 2014-12-20 DIAGNOSIS — Z13220 Encounter for screening for lipoid disorders: Secondary | ICD-10-CM

## 2014-12-20 NOTE — Telephone Encounter (Signed)
PSA met & Lipid Liver A1C urine microprotein

## 2014-12-20 NOTE — Telephone Encounter (Signed)
Pt is requesting lab orders to be sent over for his upcoming appt. Last labs per epic were: lipid,hepatic,bmp and microalbumin on 01/08/14

## 2014-12-20 NOTE — Telephone Encounter (Signed)
Called patient and informed him that the following labs were ordered per Dr.Scott at [facility] Liver A1C urine microprotein. Patient verbalized understanding.

## 2015-01-04 ENCOUNTER — Ambulatory Visit: Payer: Self-pay | Admitting: Family Medicine

## 2015-01-06 LAB — MICROALBUMIN, URINE: MICROALBUM., U, RANDOM: 4.8 ug/mL

## 2015-01-06 LAB — LIPID PANEL
Chol/HDL Ratio: 4.9 ratio units (ref 0.0–5.0)
Cholesterol, Total: 219 mg/dL — ABNORMAL HIGH (ref 100–199)
HDL: 45 mg/dL (ref >39)
LDL CALC: 129 mg/dL — AB (ref 0–99)
Triglycerides: 227 mg/dL — ABNORMAL HIGH (ref 0–149)
VLDL Cholesterol Cal: 45 mg/dL — ABNORMAL HIGH (ref 5–40)

## 2015-01-06 LAB — BASIC METABOLIC PANEL
BUN/Creatinine Ratio: 15 (ref 9–20)
BUN: 16 mg/dL (ref 6–24)
CALCIUM: 9.5 mg/dL (ref 8.7–10.2)
CHLORIDE: 101 mmol/L (ref 97–108)
CO2: 21 mmol/L (ref 18–29)
CREATININE: 1.07 mg/dL (ref 0.76–1.27)
GFR calc Af Amer: 93 mL/min/{1.73_m2} (ref >59)
GFR calc non Af Amer: 81 mL/min/{1.73_m2} (ref >59)
GLUCOSE: 190 mg/dL — AB (ref 65–99)
POTASSIUM: 4.5 mmol/L (ref 3.5–5.2)
Sodium: 141 mmol/L (ref 134–144)

## 2015-01-06 LAB — HEPATIC FUNCTION PANEL
ALBUMIN: 4.4 g/dL (ref 3.5–5.5)
ALT: 19 IU/L (ref 0–44)
AST: 14 IU/L (ref 0–40)
Alkaline Phosphatase: 75 IU/L (ref 39–117)
BILIRUBIN TOTAL: 0.9 mg/dL (ref 0.0–1.2)
Bilirubin, Direct: 0.18 mg/dL (ref 0.00–0.40)
Total Protein: 6.5 g/dL (ref 6.0–8.5)

## 2015-01-06 LAB — PSA: Prostate Specific Ag, Serum: 0.8 ng/mL (ref 0.0–4.0)

## 2015-01-06 LAB — HEMOGLOBIN A1C
Est. average glucose Bld gHb Est-mCnc: 243 mg/dL
Hgb A1c MFr Bld: 10.1 % — ABNORMAL HIGH (ref 4.8–5.6)

## 2015-01-16 ENCOUNTER — Encounter: Payer: Self-pay | Admitting: Family Medicine

## 2015-01-16 ENCOUNTER — Ambulatory Visit (INDEPENDENT_AMBULATORY_CARE_PROVIDER_SITE_OTHER): Payer: BLUE CROSS/BLUE SHIELD | Admitting: Family Medicine

## 2015-01-16 VITALS — BP 112/80 | Ht 75.0 in | Wt 248.1 lb

## 2015-01-16 DIAGNOSIS — M722 Plantar fascial fibromatosis: Secondary | ICD-10-CM

## 2015-01-16 DIAGNOSIS — E785 Hyperlipidemia, unspecified: Secondary | ICD-10-CM | POA: Insufficient documentation

## 2015-01-16 DIAGNOSIS — E119 Type 2 diabetes mellitus without complications: Secondary | ICD-10-CM | POA: Diagnosis not present

## 2015-01-16 MED ORDER — METFORMIN HCL 500 MG PO TABS: ORAL_TABLET | ORAL | Status: DC

## 2015-01-16 NOTE — Patient Instructions (Signed)

## 2015-01-16 NOTE — Progress Notes (Signed)
   Subjective:    Patient ID: Paul Barnes, male    DOB: [birth year], 49 y.o.   MRN: [id number]  Diabetes He presents for his follow-up diabetic visit. He has type 2 diabetes mellitus. No MedicAlert identification noted. He is compliant with treatment all of the time. He has not had a previous visit with a dietitian. He participates in exercise daily. He sees a podiatrist.Eye exam is not current.   Patient recently had blood work drawn on 01/05/15. 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  Results for orders placed or performed in visit on 12/20/14  PSA  Result Value Ref Range   Prostate Specific Ag, Serum 0.8 0.0 - 4.0 ng/mL  Basic metabolic panel  Result Value Ref Range   Glucose 190 (H) 65 - 99 mg/dL   BUN 16 6 - 24 mg/dL   Creatinine, Ser 1.07 0.76 - 1.27 mg/dL   GFR calc non Af Amer 81 >59 mL/min/1.73   GFR calc Af Amer 93 >59 mL/min/1.73   BUN/Creatinine Ratio 15 9 - 20   Sodium 141 134 - 144 mmol/L   Potassium 4.5 3.5 - 5.2 mmol/L   Chloride 101 97 - 108 mmol/L   CO2 21 18 - 29 mmol/L   Calcium 9.5 8.7 - 10.2 mg/dL  Lipid panel  Result Value Ref Range   Cholesterol, Total 219 (H) 100 - 199 mg/dL   Triglycerides 227 (H) 0 - 149 mg/dL   HDL 45 >39 mg/dL   VLDL Cholesterol Cal 45 (H) 5 - 40 mg/dL   LDL Calculated 129 (H) 0 - 99 mg/dL   Chol/HDL Ratio 4.9 0.0 - 5.0 ratio units  Hepatic function panel  Result Value Ref Range   Total Protein 6.5 6.0 - 8.5 g/dL   Albumin 4.4 3.5 - 5.5 g/dL   Bilirubin Total 0.9 0.0 - 1.2 mg/dL   Bilirubin, Direct 0.18 0.00 - 0.40 mg/dL   Alkaline Phosphatase 75 39 - 117 IU/L   AST 14 0 - 40 IU/L   ALT 19 0 - 44 IU/L  Hemoglobin A1c  Result Value Ref Range   Hgb A1c MFr Bld 10.1 (H) 4.8 - 5.6 %   Est. average glucose Bld gHb Est-mCnc 243 mg/dL  Microalbumin, urine  Result Value Ref Range   Microalbum.,U,Random 4.8 Not Estab. ug/mL   Lot of stress with mo having lung cancer. This is impacted ability to care for himself.  Has not been seen here for nearly a year  . Patient has no other concerns this visit.  150 160 and 190 this morn  Not exercising, knee surg and foot surg not good. Still having residual pain left foot.   Review of Systems No headache no chest pain no back pain some residual right foot pain no shortness of breath    Objective:   Physical Exam  Alert vitals stable blood pressure good. H&T normal. Lungs clear heart regular in rhythm ankles without edema      Assessment & Plan:  Impression 1 type 2 diabetes poor control with borderline compliance with diet #2 hyperlipidemia discussed at length. Needs to get LDL down her will be candidate for medication. #3 right foot pain discuss plan encouraged to see eye doctor regularly. Increase metformin to twice a day. Call us back of fasting sugars do not drop below 1:30. Recheck in several months. Form given once again for educational session. Foot pain discussed. WSL follow-up as scheduled

## 2015-01-18 ENCOUNTER — Telehealth: Payer: Self-pay | Admitting: Family Medicine

## 2015-01-18 NOTE — Telephone Encounter (Signed)
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

## 2015-01-18 NOTE — Telephone Encounter (Signed)
Pt called stating that Dr. Richardson Landry told him that he would prepare a letter for the pt's work and have it ready by yesterday. Pt called today we are unaware of such note and am unable to locate one up front. Please advise.

## 2015-01-18 NOTE — Telephone Encounter (Signed)
L[facility]

## 2015-01-24 NOTE — Telephone Encounter (Signed)
Called patient and patient stated that Clinical note was picked up a week ago.

## 2015-01-25 ENCOUNTER — Ambulatory Visit: Payer: Self-pay | Admitting: Family Medicine

## 2015-03-28 ENCOUNTER — Other Ambulatory Visit: Payer: Self-pay | Admitting: Family Medicine

## 2015-03-31 ENCOUNTER — Telehealth: Payer: Self-pay | Admitting: Family Medicine

## 2015-03-31 NOTE — Telephone Encounter (Signed)
Pt's insurance rejected order for [employer] strips, will only approve if patient is on an insulin pump  Preferred brand for his insurance is One Touch, please send in order for new diabetic meter and test strips, please send to [employer]  ( left a message on pt's voicemail explaining that his insurance prefers the One Touch brand and I had asked for a new meter & test strips to be ordered at pharmacy)

## 2015-04-03 NOTE — Telephone Encounter (Signed)
Faxed to pharm 

## 2015-04-03 NOTE — Telephone Encounter (Signed)
Ok lets do 

## 2015-04-24 ENCOUNTER — Ambulatory Visit: Payer: Self-pay | Admitting: Family Medicine

## 2015-05-03 LAB — HM DIABETES EYE EXAM

## 2015-05-05 ENCOUNTER — Encounter: Payer: Self-pay | Admitting: *Deleted

## 2015-05-22 ENCOUNTER — Encounter: Payer: Self-pay | Admitting: Family Medicine

## 2015-05-22 ENCOUNTER — Ambulatory Visit (INDEPENDENT_AMBULATORY_CARE_PROVIDER_SITE_OTHER): Payer: BLUE CROSS/BLUE SHIELD | Admitting: Family Medicine

## 2015-05-22 VITALS — BP 116/80 | Ht 74.0 in | Wt 247.0 lb

## 2015-05-22 DIAGNOSIS — R3989 Other symptoms and signs involving the genitourinary system: Secondary | ICD-10-CM | POA: Diagnosis not present

## 2015-05-22 DIAGNOSIS — J301 Allergic rhinitis due to pollen: Secondary | ICD-10-CM | POA: Diagnosis not present

## 2015-05-22 DIAGNOSIS — E119 Type 2 diabetes mellitus without complications: Secondary | ICD-10-CM

## 2015-05-22 LAB — POCT GLYCOSYLATED HEMOGLOBIN (HGB A1C): HEMOGLOBIN A1C: 8.4

## 2015-05-22 MED ORDER — GLIPIZIDE 5 MG PO TABS: 5.0000 mg | ORAL_TABLET | Freq: Every day | ORAL | Status: DC

## 2015-05-22 MED ORDER — METFORMIN HCL 500 MG PO TABS: ORAL_TABLET | ORAL | Status: DC

## 2015-05-22 MED ORDER — FLUTICASONE PROPIONATE 50 MCG/ACT NA SUSP: NASAL | Status: DC

## 2015-05-22 NOTE — Progress Notes (Signed)
   Subjective:    Patient ID: Paul Barnes, male    DOB: 15-Jan-1966, 49 y.o.   MRN: [id number]  Diabetes He presents for his follow-up diabetic visit. He has type 2 diabetes mellitus. He is compliant with treatment all of the time. He is following a generally healthy diet. Exercise: 3 -4 times a week. His overall blood glucose range is 140-180 mg/dl. He sees a podiatrist.Eye exam is current.   Declines flu vaccine.   Pt states no concerns or problems.   Results for orders placed or performed in visit on 05/22/15  POCT glycosylated hemoglobin (Hb A1C)  Result Value Ref Range   Hemoglobin A1C 8.4    Morn numbers overll better recently,  Pt had bad spell of pain two mo ago, left flank, felt exactly like kidney stone, blood in urine, now resolved. No further blood in urine. History of kidney stones in the past. Wonders whether this it may have been the case.  Ongoing challenges with allergic rhinitis. Compliant with medicine. Uses Flonase states deftly helps.  Not exercising much these days a lot of stress: On  Sig pain 160 to 190 three mo ago, now, morning numb mostly 115 to 140 mostly good   Review of Systems No headache no chest pain no back pain no current abdominal pain no current flank pain no fever or chills    Objective:   Physical Exam  Alert vitals stable HEENT normal lungs clear heart regular in rhythm. No CVA tenderness abdomen soft ankles no edema C diabetic foot exam      Assessment & Plan:  Impression 1 type 2 diabetes control suboptimal discussed at length. Recommend adding medication multiple questions answered about this. Also multiple questions answered about potential side effects of new intervention patient clearly reluctant. #2 probable transient kidney stone discussed no need for major workup at this time. Plan add Glucotrol 5 mg. Side effects discussed at great length. 25 minutes spent most in discussion. Recheck in 4 months need A1c ideally to be near 7  at that time diet exercise discussed emphasized flu shot declined WSL

## 2015-09-15 ENCOUNTER — Ambulatory Visit: Payer: BLUE CROSS/BLUE SHIELD | Admitting: Family Medicine

## 2015-12-13 ENCOUNTER — Ambulatory Visit (INDEPENDENT_AMBULATORY_CARE_PROVIDER_SITE_OTHER): Payer: BLUE CROSS/BLUE SHIELD | Admitting: Family Medicine

## 2015-12-13 ENCOUNTER — Encounter: Payer: Self-pay | Admitting: Family Medicine

## 2015-12-13 VITALS — BP 112/80 | Ht 74.0 in | Wt 249.1 lb

## 2015-12-13 DIAGNOSIS — E119 Type 2 diabetes mellitus without complications: Secondary | ICD-10-CM | POA: Diagnosis not present

## 2015-12-13 LAB — POCT GLYCOSYLATED HEMOGLOBIN (HGB A1C): Hemoglobin A1C: 6.5

## 2015-12-13 MED ORDER — GLIPIZIDE 5 MG PO TABS: 5.0000 mg | ORAL_TABLET | Freq: Every day | ORAL | Status: DC

## 2015-12-13 MED ORDER — METFORMIN HCL 500 MG PO TABS: ORAL_TABLET | ORAL | Status: DC

## 2015-12-13 NOTE — Patient Instructions (Signed)
The diabetes is in very good control with a HgbA1C of 6.5%

## 2015-12-13 NOTE — Progress Notes (Signed)
   Subjective:    Patient ID: Paul Barnes, male    DOB: 1966-03-13, 50 y.o.   MRN: [id number]  Diabetes He presents for his follow-up diabetic visit. He has type 2 diabetes mellitus. There are no hypoglycemic associated symptoms. There are no diabetic associated symptoms. There are no hypoglycemic complications. There are no diabetic complications. There are no known risk factors for coronary artery disease. Current diabetic treatment includes oral agent (dual therapy). He is compliant with treatment all of the time.   Patient has no other concerns at this time.   Results for orders placed or performed in visit on 12/13/15  POCT glycosylated hemoglobin (Hb A1C)  Result Value Ref Range   Hemoglobin A1C 6.5   ibitially had trouble with glucotrol made pt feel bad  Now taking at night , makes pt a little sleepy  Takes just one metformin per day, and one glucotrol at night   Dim energy on full dose glucotrol  Exercising so so, working about the farm      Review of Systems    No headache, no major weight loss or weight gain, no chest pain no back pain abdominal pain no change in bowel habits complete ROS otherwise negative  Objective:   Physical Exam Alert vitals stable, NAD. Blood pressure good on repeat. HEENT normal. Lungs clear. Heart regular rate and rhythm.        Assessment & Plan:  Impression type 2 diabetes clinically improved plan maintain meds diet exercise discussed. Recheck in 6 months wellness plus chronic then [facility]

## 2016-03-04 ENCOUNTER — Encounter: Payer: Self-pay | Admitting: Family Medicine

## 2016-03-04 ENCOUNTER — Ambulatory Visit (INDEPENDENT_AMBULATORY_CARE_PROVIDER_SITE_OTHER): Payer: BLUE CROSS/BLUE SHIELD | Admitting: Family Medicine

## 2016-03-04 VITALS — BP 126/84 | Temp 98.6°F | Ht 74.0 in | Wt 249.5 lb

## 2016-03-04 DIAGNOSIS — J4521 Mild intermittent asthma with (acute) exacerbation: Secondary | ICD-10-CM | POA: Diagnosis not present

## 2016-03-04 DIAGNOSIS — J310 Chronic rhinitis: Secondary | ICD-10-CM

## 2016-03-04 DIAGNOSIS — J329 Chronic sinusitis, unspecified: Secondary | ICD-10-CM

## 2016-03-04 MED ORDER — ALBUTEROL SULFATE HFA 108 (90 BASE) MCG/ACT IN AERS: 2.0000 | INHALATION_SPRAY | Freq: Four times a day (QID) | RESPIRATORY_TRACT | 2 refills | Status: DC | PRN

## 2016-03-04 MED ORDER — HYDROCODONE-HOMATROPINE 5-1.5 MG/5ML PO SYRP: ORAL_SOLUTION | ORAL | 0 refills | Status: DC

## 2016-03-04 MED ORDER — AMOXICILLIN-POT CLAVULANATE 875-125 MG PO TABS: 1.0000 | ORAL_TABLET | Freq: Two times a day (BID) | ORAL | 0 refills | Status: AC

## 2016-03-04 NOTE — Progress Notes (Signed)
   Subjective:    Patient ID: Paul Barnes, male    DOB: 08/29/1965, 50 y.o.   MRN: [id number]  Cough  This is a new problem. The current episode started in the past 7 days. The cough is productive of sputum. Associated symptoms include chest pain, ear pain, a fever, headaches, rhinorrhea, a sore throat and wheezing. He has tried OTC cough suppressant (ibuprofen,tylenol, cough drops,Dayquil) for the symptoms.   Sore throat  achey headache   Dim enrgy  Son Maylon Cos had mil d viral illness the wk before    Patient states no other concerns this visit.  Sore in the chest couhgig up stuff,  Dim enbergy   Noted low gr fever  Gunky, and dizzy     Review of Systems  Constitutional: Positive for fever.  HENT: Positive for ear pain, rhinorrhea and sore throat.   Respiratory: Positive for cough and wheezing.   Cardiovascular: Positive for chest pain.  Neurological: Positive for headaches.       Objective:   Physical Exam Alert, mild malaise. Hydration good Vitals stable. frontal/ maxillary tenderness evident positive nasal congestion. pharynx normal neck supple  lungs clear/no crackles But some mild wheezes. heart regular in rhythm        Assessment & Plan:  Impression rhinosinusitis/bronchitis with element of reactive airways plan albuterol 2 sprays daily. Antibiotics prescribed symptom care discussed

## 2016-03-07 ENCOUNTER — Telehealth: Payer: Self-pay | Admitting: Family Medicine

## 2016-03-07 MED ORDER — PREDNISONE 20 MG PO TABS: ORAL_TABLET | ORAL | 0 refills | Status: DC

## 2016-03-07 MED ORDER — LEVOFLOXACIN 500 MG PO TABS: ORAL_TABLET | ORAL | 0 refills | Status: DC

## 2016-03-07 NOTE — Telephone Encounter (Signed)
Seen 10/2. Taking augmentin, hycodan, and albuterol inhaler, shortness of breath while lying down. Has to sleep in recliner, cough all day, chills yesterday, none today.

## 2016-03-07 NOTE — Telephone Encounter (Signed)
Patient seen on 03/04/16 for rhinosinusitis.  He is still having congestion, wheezing, rattle in chest, stopped up.  It doesn't seem to be breaking up and he wants to know if we can call in something stronger.   [place]

## 2016-03-07 NOTE — Telephone Encounter (Signed)
Let pt know augmentin is strong and given iv at the hospital for pneumonia,we have not given the augmentin enough time to know if it will help with the infx,   but we will homor his request and put him on levaquin 500 one daily for ten days   with worsening sob he needs adult pred taper, if worsens may need to go t0o er for breathing rss' xrays etc

## 2016-03-07 NOTE — Telephone Encounter (Signed)
Discussed with pt. meds sent to pharm.  

## 2016-03-15 DIAGNOSIS — Z0289 Encounter for other administrative examinations: Secondary | ICD-10-CM

## 2016-03-18 ENCOUNTER — Telehealth: Payer: Self-pay | Admitting: Family Medicine

## 2016-03-18 NOTE — Telephone Encounter (Signed)
Completed FMLA paperwork and is in Dr. Jeannine Kitten box for review and signature.

## 2016-03-19 ENCOUNTER — Telehealth: Payer: Self-pay | Admitting: Family Medicine

## 2016-03-19 NOTE — Telephone Encounter (Signed)
Spoke with patient and let him know his FMLA paperwork has been completed and is ready for pick up. The patient said he may be by this afternoon or it may be in the morning (03/20/16).

## 2016-04-10 ENCOUNTER — Other Ambulatory Visit: Payer: Self-pay | Admitting: Family Medicine

## 2016-06-14 ENCOUNTER — Encounter: Payer: BLUE CROSS/BLUE SHIELD | Admitting: Family Medicine

## 2016-06-27 ENCOUNTER — Ambulatory Visit (INDEPENDENT_AMBULATORY_CARE_PROVIDER_SITE_OTHER): Payer: BLUE CROSS/BLUE SHIELD | Admitting: Family Medicine

## 2016-06-27 ENCOUNTER — Encounter: Payer: Self-pay | Admitting: Family Medicine

## 2016-06-27 VITALS — Ht 72.75 in | Wt 250.0 lb

## 2016-06-27 DIAGNOSIS — Z125 Encounter for screening for malignant neoplasm of prostate: Secondary | ICD-10-CM

## 2016-06-27 DIAGNOSIS — Z79899 Other long term (current) drug therapy: Secondary | ICD-10-CM

## 2016-06-27 DIAGNOSIS — E119 Type 2 diabetes mellitus without complications: Secondary | ICD-10-CM

## 2016-06-27 DIAGNOSIS — Z0000 Encounter for general adult medical examination without abnormal findings: Secondary | ICD-10-CM | POA: Diagnosis not present

## 2016-06-27 DIAGNOSIS — E785 Hyperlipidemia, unspecified: Secondary | ICD-10-CM

## 2016-06-27 LAB — POCT GLYCOSYLATED HEMOGLOBIN (HGB A1C): HEMOGLOBIN A1C: 7.1

## 2016-06-27 MED ORDER — GLIPIZIDE 5 MG PO TABS: 2.5000 mg | ORAL_TABLET | Freq: Every day | ORAL | 1 refills | Status: DC

## 2016-06-27 MED ORDER — METFORMIN HCL 500 MG PO TABS: ORAL_TABLET | ORAL | 1 refills | Status: DC

## 2016-06-27 MED ORDER — GLUCOSE BLOOD VI STRP: ORAL_STRIP | 1 refills | Status: DC

## 2016-06-27 NOTE — Progress Notes (Signed)
   Subjective:    Patient ID: Paul Barnes, male    DOB: Oct 21, 1965, 51 y.o.   MRN: [id number]  HPI The patient comes in today for a wellness visit.  Results for orders placed or performed in visit on 06/27/16  POCT glycosylated hemoglobin (Hb A1C)  Result Value Ref Range   Hemoglobin A1C 7.1    Patient claims compliance with diabetes medication. No obvious side effects. Reports no substantial low sugar spells. Most numbers are generally in good range when checked fasting. Generally does not miss a dose of medication. Watching diabetic diet closely   A review of their health history was completed.  A review of medications was also completed.  Any needed refills; test strips  Eating habits: health conscious  Falls/  MVA accidents in past few months: none  Regular exercise: yes  Specialist pt sees on regular basis: none  Preventative health issues were discussed.   Additional concerns: none  Morn lu cks mostly 90s and 100s. Up to 150   Walking more, more exercise, and wlking now  Diabetes. A1C today 7.1  Takes glipizide one tab   So so on the diet, trying to watch intake and doing ok not great   Review of Systems  Constitutional: Negative for activity change, appetite change and fever.  HENT: Negative for congestion and rhinorrhea.   Eyes: Negative for discharge.  Respiratory: Negative for cough and wheezing.   Cardiovascular: Negative for chest pain.  Gastrointestinal: Negative for abdominal pain, blood in stool and vomiting.  Genitourinary: Negative for difficulty urinating and frequency.  Musculoskeletal: Negative for neck pain.  Skin: Negative for rash.  Allergic/Immunologic: Negative for environmental allergies and food allergies.  Neurological: Negative for weakness and headaches.  Psychiatric/Behavioral: Negative for agitation.  All other systems reviewed and are negative.      Objective:   Physical Exam  Constitutional: He appears well-developed  and well-nourished.  HENT:  Head: Normocephalic and atraumatic.  Right Ear: External ear normal.  Left Ear: External ear normal.  Nose: Nose normal.  Mouth/Throat: Oropharynx is clear and moist.  Eyes: EOM are normal. Pupils are equal, round, and reactive to light.  Neck: Normal range of motion. Neck supple. No thyromegaly present.  Cardiovascular: Normal rate, regular rhythm and normal heart sounds.   No murmur heard. Pulmonary/Chest: Effort normal and breath sounds normal. No respiratory distress. He has no wheezes.  Abdominal: Soft. Bowel sounds are normal. He exhibits no distension and no mass. There is no tenderness.  Genitourinary: Penis normal.  Genitourinary Comments: Prostate mild enlargement normal texture  Musculoskeletal: Normal range of motion. He exhibits no edema.  Lymphadenopathy:    He has no cervical adenopathy.  Neurological: He is alert. He exhibits normal muscle tone.  Skin: Skin is warm and dry. No erythema.  Psychiatric: He has a normal mood and affect. His behavior is normal. Judgment normal.  Vitals reviewed.         Assessment & Plan:  Colon refImpression wellness exam diet exercise preventative measures discussed. Time for colonoscopy discussed #2 type 2 diabetes control not ideal but good discussed #3 erectile dysfunction. Very substantial discussion including not recommending testosterone all rationale discussed at length. Patient to think about medication. Plan appropriate blood work. Referral for colonoscopy medications refilled recheck in 6 months

## 2016-08-07 ENCOUNTER — Other Ambulatory Visit: Payer: Self-pay | Admitting: Family Medicine

## 2016-09-05 ENCOUNTER — Encounter: Payer: Self-pay | Admitting: Family Medicine

## 2016-10-02 ENCOUNTER — Telehealth: Payer: Self-pay | Admitting: Family Medicine

## 2016-10-02 ENCOUNTER — Other Ambulatory Visit: Payer: Self-pay | Admitting: *Deleted

## 2016-10-02 MED ORDER — SILDENAFIL CITRATE 20 MG PO TABS: ORAL_TABLET | ORAL | 2 refills | Status: DC

## 2016-10-02 NOTE — Telephone Encounter (Signed)
Insurance is not paying for test strips due to them being written to check 1 time a day.  It needs to be rewritten to be checked twice a day according to the pharmacy so insurance will pay.  [place]

## 2016-10-02 NOTE — Telephone Encounter (Signed)
Patient's insurance will not cover more than a 30 day supply at a time and the bottles come as 50 strips- they need it to say may test up to 2 times a day to get it to go thru insurance. Prescription given to pharmacist.

## 2016-10-02 NOTE — Telephone Encounter (Signed)
Call pharm and see what they need and why and rx accordingly

## 2016-10-02 NOTE — Telephone Encounter (Signed)
Patient is not currently on insulin so that is why it was written to test once a day

## 2016-11-18 ENCOUNTER — Encounter: Payer: Self-pay | Admitting: Family Medicine

## 2016-11-18 ENCOUNTER — Ambulatory Visit (INDEPENDENT_AMBULATORY_CARE_PROVIDER_SITE_OTHER): Payer: BLUE CROSS/BLUE SHIELD | Admitting: Family Medicine

## 2016-11-18 VITALS — BP 128/82 | Temp 98.3°F | Ht 72.75 in | Wt 250.0 lb

## 2016-11-18 DIAGNOSIS — J329 Chronic sinusitis, unspecified: Secondary | ICD-10-CM | POA: Diagnosis not present

## 2016-11-18 DIAGNOSIS — J310 Chronic rhinitis: Secondary | ICD-10-CM

## 2016-11-18 MED ORDER — HYDROCODONE-HOMATROPINE 5-1.5 MG/5ML PO SYRP: 5.0000 mL | ORAL_SOLUTION | Freq: Four times a day (QID) | ORAL | 0 refills | Status: DC | PRN

## 2016-11-18 MED ORDER — LEVOFLOXACIN 500 MG PO TABS: 500.0000 mg | ORAL_TABLET | Freq: Every day | ORAL | 0 refills | Status: DC

## 2016-11-18 NOTE — Progress Notes (Signed)
   Subjective:    Patient ID: Paul Barnes, male    DOB: [birth year], 51 y.o.   MRN: [id number]  Sore Throat   This is a new problem. The current episode started in the past 7 days. The problem has been waxing and waning. Neither side of throat is experiencing more pain than the other. There has been no fever. Associated symptoms include coughing and headaches. He has tried NSAIDs for the symptoms. The treatment provided no relief.  Congestion drainage coughing He has been under some stress his mother recently passed away   Review of Systems  Respiratory: Positive for cough.   Neurological: Positive for headaches.       Objective:   Physical Exam Lungs clear heart regular HEENT benign neck no masses       Assessment & Plan:  Viral URI Secondary rhinosinusitis Antibodies prescribed warning signs discussed Cough medication for home use only at the evening time That can cause slight elevation of sugars if any serious trouble follow-up

## 2016-12-13 ENCOUNTER — Other Ambulatory Visit: Payer: Self-pay | Admitting: Family Medicine

## 2016-12-24 ENCOUNTER — Ambulatory Visit: Payer: BLUE CROSS/BLUE SHIELD | Admitting: Family Medicine

## 2017-02-17 ENCOUNTER — Telehealth: Payer: Self-pay | Admitting: Family Medicine

## 2017-02-17 ENCOUNTER — Other Ambulatory Visit: Payer: Self-pay | Admitting: *Deleted

## 2017-02-17 MED ORDER — GLIPIZIDE 5 MG PO TABS: ORAL_TABLET | ORAL | 0 refills | Status: DC

## 2017-02-17 NOTE — Telephone Encounter (Signed)
Requesting Rx for Glipizide.  He said this was changed to 2 times daily the last time he was seen.  [facility]

## 2017-02-17 NOTE — Telephone Encounter (Signed)
30 day supply sent to pharm per dr Richardson Landry. Pt notified. Has office visit scheduled.

## 2017-03-03 ENCOUNTER — Ambulatory Visit: Payer: BLUE CROSS/BLUE SHIELD | Admitting: Family Medicine

## 2017-03-26 ENCOUNTER — Telehealth: Payer: Self-pay | Admitting: Family Medicine

## 2017-03-26 ENCOUNTER — Telehealth: Payer: Self-pay | Admitting: *Deleted

## 2017-03-26 DIAGNOSIS — E785 Hyperlipidemia, unspecified: Secondary | ICD-10-CM

## 2017-03-26 DIAGNOSIS — Z125 Encounter for screening for malignant neoplasm of prostate: Secondary | ICD-10-CM

## 2017-03-26 DIAGNOSIS — Z79899 Other long term (current) drug therapy: Secondary | ICD-10-CM

## 2017-03-26 DIAGNOSIS — E119 Type 2 diabetes mellitus without complications: Secondary | ICD-10-CM

## 2017-03-26 MED ORDER — METFORMIN HCL 500 MG PO TABS: ORAL_TABLET | ORAL | 0 refills | Status: DC

## 2017-03-26 MED ORDER — GLIPIZIDE 5 MG PO TABS: ORAL_TABLET | ORAL | 0 refills | Status: DC

## 2017-03-26 NOTE — Telephone Encounter (Signed)
Patient called requesting glipizide and metformin to be called into [facility] [place] .Please advise [phone]

## 2017-03-26 NOTE — Telephone Encounter (Signed)
Blood work ordered in EPIC. Patient notified. 

## 2017-03-26 NOTE — Telephone Encounter (Signed)
Prescription sent electronically to pharmacy. Patient notified. 

## 2017-03-26 NOTE — Telephone Encounter (Signed)
Patient had Lipid, liver, met 7, hgbA1c, psa, microalbumin urine on 06/2016

## 2017-03-26 NOTE — Telephone Encounter (Signed)
Requesting orders for labs.  He has an appointment on 04/17/17 with Dr. Richardson Landry.

## 2017-03-26 NOTE — Telephone Encounter (Signed)
same

## 2017-03-27 ENCOUNTER — Ambulatory Visit: Payer: BLUE CROSS/BLUE SHIELD | Admitting: Family Medicine

## 2017-04-17 ENCOUNTER — Encounter: Payer: Self-pay | Admitting: Gastroenterology

## 2017-04-17 ENCOUNTER — Ambulatory Visit: Payer: BLUE CROSS/BLUE SHIELD | Admitting: Family Medicine

## 2017-04-20 LAB — HEMOGLOBIN A1C
Est. average glucose Bld gHb Est-mCnc: 214 mg/dL
Hgb A1c MFr Bld: 9.1 % — ABNORMAL HIGH (ref 4.8–5.6)

## 2017-04-20 LAB — HEPATIC FUNCTION PANEL
ALBUMIN: 4.5 g/dL (ref 3.5–5.5)
ALK PHOS: 83 IU/L (ref 39–117)
ALT: 14 IU/L (ref 0–44)
AST: 14 IU/L (ref 0–40)
BILIRUBIN TOTAL: 0.6 mg/dL (ref 0.0–1.2)
BILIRUBIN, DIRECT: 0.13 mg/dL (ref 0.00–0.40)
TOTAL PROTEIN: 6.9 g/dL (ref 6.0–8.5)

## 2017-04-20 LAB — BASIC METABOLIC PANEL
BUN / CREAT RATIO: 13 (ref 9–20)
BUN: 14 mg/dL (ref 6–24)
CO2: 25 mmol/L (ref 20–29)
CREATININE: 1.1 mg/dL (ref 0.76–1.27)
Calcium: 9.5 mg/dL (ref 8.7–10.2)
Chloride: 102 mmol/L (ref 96–106)
GFR calc non Af Amer: 77 mL/min/{1.73_m2} (ref >59)
GFR, EST AFRICAN AMERICAN: 89 mL/min/{1.73_m2} (ref >59)
Glucose: 203 mg/dL — ABNORMAL HIGH (ref 65–99)
Potassium: 4.9 mmol/L (ref 3.5–5.2)
Sodium: 141 mmol/L (ref 134–144)

## 2017-04-20 LAB — MICROALBUMIN / CREATININE URINE RATIO
Creatinine, Urine: 112.2 mg/dL
Microalb/Creat Ratio: <2.7 mg/g creat (ref 0.0–30.0)
Microalbumin, Urine: <3 ug/mL

## 2017-04-20 LAB — LIPID PANEL
CHOL/HDL RATIO: 4.2 ratio (ref 0.0–5.0)
Cholesterol, Total: 208 mg/dL — ABNORMAL HIGH (ref 100–199)
HDL: 49 mg/dL (ref >39)
LDL Calculated: 135 mg/dL — ABNORMAL HIGH (ref 0–99)
Triglycerides: 118 mg/dL (ref 0–149)
VLDL Cholesterol Cal: 24 mg/dL (ref 5–40)

## 2017-04-20 LAB — PSA: PROSTATE SPECIFIC AG, SERUM: 0.8 ng/mL (ref 0.0–4.0)

## 2017-04-28 ENCOUNTER — Other Ambulatory Visit: Payer: Self-pay | Admitting: Family Medicine

## 2017-05-01 ENCOUNTER — Encounter: Payer: Self-pay | Admitting: Family Medicine

## 2017-05-01 ENCOUNTER — Ambulatory Visit (INDEPENDENT_AMBULATORY_CARE_PROVIDER_SITE_OTHER): Payer: BLUE CROSS/BLUE SHIELD | Admitting: Family Medicine

## 2017-05-01 VITALS — BP 138/88 | Ht 72.75 in | Wt 247.0 lb

## 2017-05-01 DIAGNOSIS — E119 Type 2 diabetes mellitus without complications: Secondary | ICD-10-CM

## 2017-05-01 DIAGNOSIS — E785 Hyperlipidemia, unspecified: Secondary | ICD-10-CM

## 2017-05-01 DIAGNOSIS — N5201 Erectile dysfunction due to arterial insufficiency: Secondary | ICD-10-CM | POA: Diagnosis not present

## 2017-05-01 MED ORDER — GLIPIZIDE 5 MG PO TABS: ORAL_TABLET | ORAL | 1 refills | Status: DC

## 2017-05-01 MED ORDER — SILDENAFIL CITRATE 20 MG PO TABS: ORAL_TABLET | ORAL | 6 refills | Status: DC

## 2017-05-01 MED ORDER — GLUCOSE BLOOD VI STRP: ORAL_STRIP | 1 refills | Status: DC

## 2017-05-01 MED ORDER — METFORMIN HCL 500 MG PO TABS: ORAL_TABLET | ORAL | 1 refills | Status: DC

## 2017-05-01 NOTE — Progress Notes (Signed)
Subjective:    Patient ID: Paul Barnes, male    DOB: [birth year], 51 y.o.   MRN: [id number] Patient arrives office with multiple concerns HPI  Patient is here today to follow up on DM. He is currently on Glipizide 5 mg one QD,and Metformin 500 mg one tablets BID.  Results for orders placed or performed in visit on 03/26/17  Lipid panel  Result Value Ref Range   Cholesterol, Total 208 (H) 100 - 199 mg/dL   Triglycerides 118 0 - 149 mg/dL   HDL 49 >39 mg/dL   VLDL Cholesterol Cal 24 5 - 40 mg/dL   LDL Calculated 135 (H) 0 - 99 mg/dL   Chol/HDL Ratio 4.2 0.0 - 5.0 ratio  Hepatic function panel  Result Value Ref Range   Total Protein 6.9 6.0 - 8.5 g/dL   Albumin 4.5 3.5 - 5.5 g/dL   Bilirubin Total 0.6 0.0 - 1.2 mg/dL   Bilirubin, Direct 0.13 0.00 - 0.40 mg/dL   Alkaline Phosphatase 83 39 - 117 IU/L   AST 14 0 - 40 IU/L   ALT 14 0 - 44 IU/L  Basic metabolic panel  Result Value Ref Range   Glucose 203 (H) 65 - 99 mg/dL   BUN 14 6 - 24 mg/dL   Creatinine, Ser 1.10 0.76 - 1.27 mg/dL   GFR calc non Af Amer 77 >59 mL/min/1.73   GFR calc Af Amer 89 >59 mL/min/1.73   BUN/Creatinine Ratio 13 9 - 20   Sodium 141 134 - 144 mmol/L   Potassium 4.9 3.5 - 5.2 mmol/L   Chloride 102 96 - 106 mmol/L   CO2 25 20 - 29 mmol/L   Calcium 9.5 8.7 - 10.2 mg/dL  Hemoglobin A1c  Result Value Ref Range   Hgb A1c MFr Bld 9.1 (H) 4.8 - 5.6 %   Est. average glucose Bld gHb Est-mCnc 214 mg/dL  PSA  Result Value Ref Range   Prostate Specific Ag, Serum 0.8 0.0 - 4.0 ng/mL  Microalbumin / creatinine urine ratio  Result Value Ref Range   Creatinine, Urine 112.2 Not Estab. mg/dL   Albumin, Urine <3.0 Not Estab. ug/mL   Microalb/Creat Ratio <2.7 0.0 - 30.0 mg/g creat   Patient claims compliance with diabetes medication. No obvious side effects. Reports no substantial low sugar spells. Most numbers are generally in good range when checked fasting. Generally does not miss a dose of medication.  Watching diabetic diet closely   Patient has a long-standing history of erectile dysfunction.  Do 20 mg generic Viagra simply did not help.  Patient wonders if another brand name will be more helpful.  Patient returns to office cholesterol concerns.  Has heard some discussion regarding whether he should be on medication or not.   Some low spells in the range of 90 or so in morn    Skips meals on occaion with work, has some food on hand   His last A1c was 04/19/2017 at 9.1 Review of Systems No headache, no major weight loss or weight gain, no chest pain no back pain abdominal pain no change in bowel habits complete ROS otherwise negative     Objective:   Physical Exam  Alert and oriented, vitals reviewed and stable, NAD ENT-TM's and ext canals WNL bilat via otoscopic exam Soft palate, tonsils and post pharynx WNL via oropharyngeal exam Neck-symmetric, no masses; thyroid nonpalpable and nontender Pulmonary-no tachypnea or accessory muscle use; Clear without wheezes via auscultation Card--no abnrml murmurs, rhythm  reg and rate WNL Carotid pulses symmetric, without bruits       Assessment & Plan:  Impression 1 type 2 diabetes fairly bad control.  Discussed.  Patient reluctant to increase medicines considerably.  Will increase metformin to 2 p.o. twice daily.  Remove [place] the every morning.  Also will work harder on diet and exercise.  #2 erectile dysfunction discussed at length including options patient tenderness and I feel see if it helps  3.  Hyperlipidemia.  New ADA recommendations discussed at great length.  I have advised initiate statin.  After hearing lengthy description patient will ask to hold off for now  Changes as noted.  Diet exercise discussed.  Medications refilled.  Recheck in 6 months  Greater than 50% of this 25 minute face to face visit was spent in counseling and discussion and coordination of care regarding the above diagnosis/diagnosies

## 2017-05-02 ENCOUNTER — Other Ambulatory Visit: Payer: Self-pay | Admitting: *Deleted

## 2017-05-02 ENCOUNTER — Telehealth: Payer: Self-pay | Admitting: *Deleted

## 2017-05-02 MED ORDER — SILDENAFIL CITRATE 100 MG PO TABS: ORAL_TABLET | ORAL | 11 refills | Status: DC

## 2017-05-02 NOTE — Telephone Encounter (Signed)
Sure numb 8  one p o two hrs  prior to sexal activity 11 ref

## 2017-05-02 NOTE — Telephone Encounter (Signed)
[facility] [place] calling to get med change. Pt takes sildenafil 20mg  4 tablets 2 hours prior to sex. Insurance will not cover 20mg  but will cover 100mg . Change dose be changed.

## 2017-05-02 NOTE — Telephone Encounter (Signed)
Med sent to pharm[facility] will let pt know med dose was changed

## 2017-05-25 DIAGNOSIS — J22 Unspecified acute lower respiratory infection: Secondary | ICD-10-CM | POA: Diagnosis not present

## 2017-05-25 DIAGNOSIS — R05 Cough: Secondary | ICD-10-CM | POA: Diagnosis not present

## 2017-06-06 ENCOUNTER — Other Ambulatory Visit: Payer: Self-pay

## 2017-06-06 ENCOUNTER — Ambulatory Visit (AMBULATORY_SURGERY_CENTER): Payer: Self-pay | Admitting: *Deleted

## 2017-06-06 VITALS — Ht 73.0 in | Wt 257.2 lb

## 2017-06-06 DIAGNOSIS — Z1211 Encounter for screening for malignant neoplasm of colon: Secondary | ICD-10-CM

## 2017-06-06 MED ORDER — PEG-KCL-NACL-NASULF-NA ASC-C 140 G PO SOLR: 1.0000 | ORAL | 0 refills | Status: DC

## 2017-06-06 MED ORDER — PLENVU 140 G PO SOLR: 1.0000 | ORAL | 0 refills | Status: DC

## 2017-06-06 NOTE — Progress Notes (Signed)
No egg or soy allergy known to patient  No issues with past sedation with any surgeries  or procedures, no intubation problems  No diet pills per patient No home 02 use per patient  No blood thinners per patient  Pt denies issues with constipation  No A fib or A flutter  EMMI video sent to pt's e mail  

## 2017-06-09 ENCOUNTER — Ambulatory Visit (INDEPENDENT_AMBULATORY_CARE_PROVIDER_SITE_OTHER): Payer: BLUE CROSS/BLUE SHIELD | Admitting: Family Medicine

## 2017-06-09 ENCOUNTER — Encounter: Payer: Self-pay | Admitting: Family Medicine

## 2017-06-09 VITALS — BP 128/86 | Temp 98.0°F | Ht 72.75 in | Wt 250.8 lb

## 2017-06-09 DIAGNOSIS — J329 Chronic sinusitis, unspecified: Secondary | ICD-10-CM

## 2017-06-09 MED ORDER — ALBUTEROL SULFATE HFA 108 (90 BASE) MCG/ACT IN AERS
2.0000 | INHALATION_SPRAY | Freq: Four times a day (QID) | RESPIRATORY_TRACT | 2 refills | Status: DC | PRN
Start: 2017-06-09 — End: 2017-10-30

## 2017-06-09 MED ORDER — CLARITHROMYCIN 500 MG PO TABS: 500.0000 mg | ORAL_TABLET | Freq: Two times a day (BID) | ORAL | 0 refills | Status: DC

## 2017-06-09 MED ORDER — BENZONATATE 100 MG PO CAPS: 100.0000 mg | ORAL_CAPSULE | Freq: Three times a day (TID) | ORAL | 0 refills | Status: DC | PRN

## 2017-06-09 NOTE — Progress Notes (Signed)
   Subjective:    Patient ID: ABDURAHMAN RUGG, male    DOB: May 03, 1966, 52 y.o.   MRN: [id number]  Cough  This is a new problem. The current episode started in the past 7 days. Associated symptoms include headaches and nasal congestion. Treatments tried: tylenol.   Seen two weeks ago with the dx of sinusitis  Now sat started with stufy nd cong  Some sore throat  No noticeabe musle aches an joint aches  Energy down apetite down   Took tynol prn     Review of Systems  Respiratory: Positive for cough.   Neurological: Positive for headaches.       Objective:   Physical Exam Alert, mild malaise. Hydration good Vitals stable. frontal/ maxillary tenderness evident positive nasal congestion. pharynx normal neck supple  lungs clear/no crackles or wheezes. heart regular in rhythm        Assessment & Plan:  Impression rhinosinusitis likely post viral, discussed with patient. plan antibiotics prescribed. Questions answered. Symptomatic care discussed. warning signs discussed. WSL

## 2017-06-12 ENCOUNTER — Telehealth: Payer: Self-pay | Admitting: Family Medicine

## 2017-06-12 MED ORDER — DOXYCYCLINE HYCLATE 100 MG PO TABS: 100.0000 mg | ORAL_TABLET | Freq: Two times a day (BID) | ORAL | 0 refills | Status: DC

## 2017-06-12 NOTE — Telephone Encounter (Signed)
Patient seen Dr. Richardson Landry on 06/09/17 for rhinosinusitis.  Patient is still having cough, head and chest congestion, chest is sore.  He is not having any fever.  He said he feels worse today and wants to know what Dr. Richardson Landry recommends?   [facility]

## 2017-06-12 NOTE — Telephone Encounter (Signed)
Patient was given Biaxin and Tessalon Perles on 06/09/17

## 2017-06-12 NOTE — Telephone Encounter (Signed)
Prescription sent electronically to pharmacy.  Patient notified and verbalized understanding. 

## 2017-06-12 NOTE — Telephone Encounter (Signed)
Two possibilities one is he has viral symtoms that will not respond to any antibiotic, the other possiblility is his infxn is resistnt to biaxin, swithc to oral doxy 100 bid for ten d

## 2017-06-17 ENCOUNTER — Telehealth: Payer: Self-pay | Admitting: Gastroenterology

## 2017-06-17 MED ORDER — NA SULFATE-K SULFATE-MG SULF 17.5-3.13-1.6 GM/177ML PO SOLN: 1.0000 | Freq: Once | ORAL | 0 refills | Status: AC

## 2017-06-17 NOTE — Telephone Encounter (Signed)
Spoke with Paul Barnes at [facility] [employer]- she told me suprep is covered at a zero dollar co pay- sent in script for suprep- did new instructions- Lm for pt to call me and let me know if I need to mail or he will pick up these new instructions  Lelan Pons PV

## 2017-06-17 NOTE — Telephone Encounter (Signed)
Patient Called states mail new instructions as he is going OOT until Monday- he states he will get suprep Monday 21st  Informed to call with questions   Lelan Pons PV

## 2017-06-27 ENCOUNTER — Encounter: Payer: BLUE CROSS/BLUE SHIELD | Admitting: Gastroenterology

## 2017-06-27 ENCOUNTER — Telehealth: Payer: Self-pay | Admitting: Family Medicine

## 2017-06-27 NOTE — Telephone Encounter (Signed)
Pt has been on two strong abx in a row, rec no further antibiotics, take three tspns pain orobitussin four times per day, if persists will need o v before any further rx

## 2017-06-27 NOTE — Telephone Encounter (Signed)
Patient was originally given Biaxin and switched to Doxycycline

## 2017-06-27 NOTE — Telephone Encounter (Signed)
Patient advised per Dr Richardson Landry- Patient has been on two strong abx in a row, rec no further antibiotics, take three tspns of plain robitussin  four times per day, if persists will need o v before any further rx. Patient verbalized understanding.

## 2017-06-27 NOTE — Telephone Encounter (Signed)
Patient seen Dr. Richardson Landry on 06/09/17 for rhinosinusitis.  He was prescribed doxycycline and he is better, but still has a cough and chest congestion.  He has a couple of pills left on antibiotic, but wants to know what he can do for the chest congestion?  [facility]

## 2017-08-04 ENCOUNTER — Telehealth: Payer: Self-pay | Admitting: Gastroenterology

## 2017-08-04 NOTE — Telephone Encounter (Signed)
Mailed patient new prep instructions.

## 2017-08-06 ENCOUNTER — Encounter: Payer: BLUE CROSS/BLUE SHIELD | Admitting: Gastroenterology

## 2017-09-04 ENCOUNTER — Ambulatory Visit (AMBULATORY_SURGERY_CENTER): Payer: BLUE CROSS/BLUE SHIELD | Admitting: Gastroenterology

## 2017-09-04 ENCOUNTER — Encounter: Payer: Self-pay | Admitting: Gastroenterology

## 2017-09-04 ENCOUNTER — Other Ambulatory Visit: Payer: Self-pay

## 2017-09-04 VITALS — BP 102/64 | HR 71 | Temp 98.0°F | Resp 15 | Ht 72.0 in | Wt 250.0 lb

## 2017-09-04 DIAGNOSIS — D122 Benign neoplasm of ascending colon: Secondary | ICD-10-CM | POA: Diagnosis not present

## 2017-09-04 DIAGNOSIS — D128 Benign neoplasm of rectum: Secondary | ICD-10-CM

## 2017-09-04 DIAGNOSIS — Z1211 Encounter for screening for malignant neoplasm of colon: Secondary | ICD-10-CM

## 2017-09-04 DIAGNOSIS — D126 Benign neoplasm of colon, unspecified: Secondary | ICD-10-CM

## 2017-09-04 DIAGNOSIS — K635 Polyp of colon: Secondary | ICD-10-CM

## 2017-09-04 DIAGNOSIS — D123 Benign neoplasm of transverse colon: Secondary | ICD-10-CM

## 2017-09-04 DIAGNOSIS — K621 Rectal polyp: Secondary | ICD-10-CM | POA: Diagnosis not present

## 2017-09-04 DIAGNOSIS — D127 Benign neoplasm of rectosigmoid junction: Secondary | ICD-10-CM

## 2017-09-04 DIAGNOSIS — D129 Benign neoplasm of anus and anal canal: Secondary | ICD-10-CM

## 2017-09-04 MED ORDER — SODIUM CHLORIDE 0.9 % IV SOLN
500.0000 mL | INTRAVENOUS | Status: DC
Start: 2017-09-04 — End: 2018-01-15

## 2017-09-04 NOTE — Op Note (Signed)
[facility] Patient Name: Paul Barnes Procedure Date: 09/04/2017 7:55 AM MRN: [id number] Endoscopist: Remo Lipps P. Gladies Sofranko MD, MD Age: 52 Referring MD:  Date of Birth: Jun 18, 1965 Gender: Male Account #: [id number] Procedure:                Colonoscopy Indications:              Screening for colorectal malignant neoplasm, This                            is the patient's first colonoscopy Medicines:                Monitored Anesthesia Care Procedure:                Pre-Anesthesia Assessment:                           - Prior to the procedure, a History and Physical                            was performed, and patient medications and                            allergies were reviewed. The patient's tolerance of                            previous anesthesia was also reviewed. The risks                            and benefits of the procedure and the sedation                            options and risks were discussed with the patient.                            All questions were answered, and informed consent                            was obtained. Prior Anticoagulants: The patient has                            taken no previous anticoagulant or antiplatelet                            agents. ASA Grade Assessment: II - A patient with                            mild systemic disease. After reviewing the risks                            and benefits, the patient was deemed in                            satisfactory condition to undergo the procedure.  After obtaining informed consent, the colonoscope                            was passed under direct vision. Throughout the                            procedure, the patient's blood pressure, pulse, and                            oxygen saturations were monitored continuously. The                            Colonoscope was introduced through the anus and                            advanced to the the  cecum, identified by                            appendiceal orifice and ileocecal valve. The                            colonoscopy was performed without difficulty. The                            patient tolerated the procedure well. The quality                            of the bowel preparation was good. The ileocecal                            valve, appendiceal orifice, and rectum were                            photographed. Scope In: 8:03:03 AM Scope Out: 8:20:13 AM Scope Withdrawal Time: 0 hours 15 minutes 10 seconds  Total Procedure Duration: 0 hours 17 minutes 10 seconds  Findings:                 The perianal and digital rectal examinations were                            normal.                           Two sessile polyps were found in the ascending                            colon. The polyps were 4 mm in size. These polyps                            were removed with a cold snare. Resection and                            retrieval were complete.  A 4 mm polyp was found in the transverse colon. The                            polyp was sessile. The polyp was removed with a                            cold snare. Resection and retrieval were complete.                           Two sessile polyps were found in the recto-sigmoid                            colon. The polyps were 4 mm in size. These polyps                            were removed with a cold snare. Resection and                            retrieval were complete.                           Two sessile polyps were found in the rectum. The                            polyps were 3 to 4 mm in size. These polyps were                            removed with a cold snare. Resection and retrieval                            were complete.                           A few medium-mouthed diverticula were found in the                            sigmoid colon.                           Internal  hemorrhoids were found during retroflexion.                           The exam was otherwise without abnormality. Complications:            No immediate complications. Estimated blood loss:                            Minimal. Estimated Blood Loss:     Estimated blood loss was minimal. Impression:               - Two 4 mm polyps in the ascending colon, removed                            with a cold snare. Resected and retrieved.                           -  One 4 mm polyp in the transverse colon, removed                            with a cold snare. Resected and retrieved.                           - Two 4 mm polyps at the recto-sigmoid colon,                            removed with a cold snare. Resected and retrieved.                           - Two 3 to 4 mm polyps in the rectum, removed with                            a cold snare. Resected and retrieved.                           - Diverticulosis in the sigmoid colon.                           - Internal hemorrhoids.                           - The examination was otherwise normal. Recommendation:           - Patient has a contact number available for                            emergencies. The signs and symptoms of potential                            delayed complications were discussed with the                            patient. Return to normal activities tomorrow.                            Written discharge instructions were provided to the                            patient.                           - Resume previous diet.                           - Continue present medications.                           - Await pathology results.                           - Repeat colonoscopy is recommended for  surveillance. The colonoscopy date will be                            determined after pathology results from today's                            exam become available for review. Remo Lipps P. Elsbeth Yearick MD,  MD 09/04/2017 8:26:56 AM This report has been signed electronically.

## 2017-09-04 NOTE — Progress Notes (Signed)
Report given to PACU, vss 

## 2017-09-04 NOTE — Progress Notes (Signed)
Pt's states no medical or surgical changes since previsit or office visit. 

## 2017-09-04 NOTE — Patient Instructions (Addendum)
HANDOUTS GIVEN FOR POLYPS, DIVERTICULOSIS AND HEMORRHOIDS  YOU HAD AN ENDOSCOPIC PROCEDURE TODAY AT THE [facility]:   Refer to the procedure report that was given to you for any specific questions about what was found during the examination.  If the procedure report does not answer your questions, please call your gastroenterologist to clarify.  If you requested that your care partner not be given the details of your procedure findings, then the procedure report has been included in a sealed envelope for you to review at your convenience later.  YOU SHOULD EXPECT: Some feelings of bloating in the abdomen. Passage of more gas than usual.  Walking can help get rid of the air that was put into your GI tract during the procedure and reduce the bloating. If you had a lower endoscopy (such as a colonoscopy or flexible sigmoidoscopy) you may notice spotting of blood in your stool or on the toilet paper. If you underwent a bowel prep for your procedure, you may not have a normal bowel movement for a few days.  Please Note:  You might notice some irritation and congestion in your nose or some drainage.  This is from the oxygen used during your procedure.  There is no need for concern and it should clear up in a day or so.  SYMPTOMS TO REPORT IMMEDIATELY:   Following lower endoscopy (colonoscopy or flexible sigmoidoscopy):  Excessive amounts of blood in the stool  Significant tenderness or worsening of abdominal pains  Swelling of the abdomen that is new, acute  Fever of 100F or higher  For urgent or emergent issues, a gastroenterologist can be reached at any hour by calling [phone].   DIET:  We do recommend a small meal at first, but then you may proceed to your regular diet.  Drink plenty of fluids but you should avoid alcoholic beverages for 24 hours.  ACTIVITY:  You should plan to take it easy for the rest of today and you should NOT DRIVE or use heavy machinery until tomorrow  (because of the sedation medicines used during the test).    FOLLOW UP: Our staff will call the number listed on your records the next business day following your procedure to check on you and address any questions or concerns that you may have regarding the information given to you following your procedure. If we do not reach you, we will leave a message.  However, if you are feeling well and you are not experiencing any problems, there is no need to return our call.  We will assume that you have returned to your regular daily activities without incident.  If any biopsies were taken you will be contacted by phone or by letter within the next 1-3 weeks.  Please call us at [phone] if you have not heard about the biopsies in 3 weeks.    SIGNATURES/CONFIDENTIALITY: You and/or your care partner have signed paperwork which will be entered into your electronic medical record.  These signatures attest to the fact that that the information above on your After Visit Summary has been reviewed and is understood.  Full responsibility of the confidentiality of this discharge information lies with you and/or your care-partner. 

## 2017-09-04 NOTE — Progress Notes (Signed)
Called to room to assist during endoscopic procedure.  Patient ID and intended procedure confirmed with present staff. Received instructions for my participation in the procedure from the performing physician.  

## 2017-09-05 ENCOUNTER — Telehealth: Payer: Self-pay | Admitting: *Deleted

## 2017-09-05 NOTE — Telephone Encounter (Signed)
  Follow up Call-  Call back number 09/04/2017  Post procedure Call Back phone  # [phone]  Permission to leave phone message Yes  Some recent data might be hidden     Patient questions:  Do you have a fever, pain , or abdominal swelling? No. Pain Score  0 *  Have you tolerated food without any problems? Yes.    Have you been able to return to your normal activities? Yes.    Do you have any questions about your discharge instructions: Diet   No. Medications  No. Follow up visit  No.  Do you have questions or concerns about your Care? No.  Actions: * If pain score is 4 or above: No action needed, pain <4.

## 2017-09-09 ENCOUNTER — Encounter: Payer: Self-pay | Admitting: Gastroenterology

## 2017-10-18 DIAGNOSIS — J060 Acute laryngopharyngitis: Secondary | ICD-10-CM | POA: Diagnosis not present

## 2017-10-18 DIAGNOSIS — Z6832 Body mass index (BMI) 32.0-32.9, adult: Secondary | ICD-10-CM | POA: Diagnosis not present

## 2017-10-18 DIAGNOSIS — J019 Acute sinusitis, unspecified: Secondary | ICD-10-CM | POA: Diagnosis not present

## 2017-10-28 ENCOUNTER — Other Ambulatory Visit: Payer: Self-pay | Admitting: Family Medicine

## 2017-10-30 ENCOUNTER — Encounter: Payer: Self-pay | Admitting: Family Medicine

## 2017-10-30 ENCOUNTER — Ambulatory Visit (INDEPENDENT_AMBULATORY_CARE_PROVIDER_SITE_OTHER): Payer: BLUE CROSS/BLUE SHIELD | Admitting: Family Medicine

## 2017-10-30 VITALS — BP 132/88 | Ht 72.0 in | Wt 254.4 lb

## 2017-10-30 DIAGNOSIS — E785 Hyperlipidemia, unspecified: Secondary | ICD-10-CM

## 2017-10-30 DIAGNOSIS — Z0000 Encounter for general adult medical examination without abnormal findings: Secondary | ICD-10-CM | POA: Diagnosis not present

## 2017-10-30 DIAGNOSIS — E119 Type 2 diabetes mellitus without complications: Secondary | ICD-10-CM | POA: Diagnosis not present

## 2017-10-30 DIAGNOSIS — J4521 Mild intermittent asthma with (acute) exacerbation: Secondary | ICD-10-CM | POA: Diagnosis not present

## 2017-10-30 LAB — POCT GLYCOSYLATED HEMOGLOBIN (HGB A1C): HEMOGLOBIN A1C: 8.1 % — AB (ref 4.0–5.6)

## 2017-10-30 MED ORDER — METFORMIN HCL 500 MG PO TABS: ORAL_TABLET | ORAL | 1 refills | Status: DC

## 2017-10-30 MED ORDER — GLIPIZIDE 5 MG PO TABS: ORAL_TABLET | ORAL | 1 refills | Status: DC

## 2017-10-30 MED ORDER — ALBUTEROL SULFATE HFA 108 (90 BASE) MCG/ACT IN AERS: 2.0000 | INHALATION_SPRAY | Freq: Four times a day (QID) | RESPIRATORY_TRACT | 2 refills | Status: DC | PRN

## 2017-10-30 MED ORDER — SILDENAFIL CITRATE 100 MG PO TABS: ORAL_TABLET | ORAL | 11 refills | Status: DC

## 2017-10-30 NOTE — Progress Notes (Signed)
Subjective:    Patient ID: Paul Barnes, male    DOB: Aug 03, 1965, 52 y.o.   MRN: [id number]  HPI The patient comes in today for a wellness visit.  does not want prostate exam   A review of their health history was completed.  A review of medications was also completed.  Any needed refills; yes  Eating habits: eats pretty good; black coffee; no soft drinks for 2 years; bread and pasta about twice a month  Falls/  MVA accidents in past few months: no  Regular exercise: walk and race  Specialist pt sees on regular basis: no  Preventative health issues were discussed.   Additional concerns: no Results for orders placed or performed in visit on 10/30/17  POCT HgB A1C  Result Value Ref Range   Hemoglobin A1C 8.1 (A) 4.0 - 5.6 %   HbA1c, POC (prediabetic range)  5.7 - 6.4 %   HbA1c, POC (controlled diabetic range)  0.0 - 7.0 %   protractd cough last three seeks  Has used allegr ad and nasal spray and cough drops   Patient claims compliance with diabetes medication. No obvious side effects. Reports no substantial low sugar spells. Most numbers are generally in good range when checked fasting. Generally does not miss a dose of medication. Watching diabetic diet closely  Using cinammon and milk thistle and garlic   Started two days ago         Review of Systems  Constitutional: Negative for activity change, appetite change and fever.  HENT: Negative for congestion and rhinorrhea.   Eyes: Negative for discharge.  Respiratory: Negative for cough and wheezing.   Cardiovascular: Negative for chest pain.  Gastrointestinal: Negative for abdominal pain, blood in stool and vomiting.  Genitourinary: Negative for difficulty urinating and frequency.  Musculoskeletal: Negative for neck pain.  Skin: Negative for rash.  Allergic/Immunologic: Negative for environmental allergies and food allergies.  Neurological: Negative for weakness and headaches.  Psychiatric/Behavioral:  Negative for agitation.  All other systems reviewed and are negative.      Objective:   Physical Exam  Constitutional: He appears well-developed and well-nourished.  HENT:  Head: Normocephalic and atraumatic.  Right Ear: External ear normal.  Left Ear: External ear normal.  Nose: Nose normal.  Mouth/Throat: Oropharynx is clear and moist.  Eyes: Pupils are equal, round, and reactive to light. EOM are normal.  Neck: Normal range of motion. Neck supple. No thyromegaly present.  Cardiovascular: Normal rate, regular rhythm and normal heart sounds.  No murmur heard. Pulmonary/Chest: Effort normal and breath sounds normal. No respiratory distress. He has no wheezes.  Abdominal: Soft. Bowel sounds are normal. He exhibits no distension and no mass. There is no tenderness.  Genitourinary: Penis normal.  Musculoskeletal: Normal range of motion. He exhibits no edema.  Lymphadenopathy:    He has no cervical adenopathy.  Neurological: He is alert. He exhibits normal muscle tone.  Skin: Skin is warm and dry. No erythema.  Psychiatric: He has a normal mood and affect. His behavior is normal. Judgment normal.  Vitals reviewed.         Assessment & Plan:  Impression1 wellness exam.  Diet discussed.  Exercise discussed.  Up-to-date on colonoscopy.  Blood work discussed and reviewed.  No need from vaccines today  2.  Type 2 diabetes controlled suboptimum discussed.  Increase glipizide to 1 p.o. twice daily.  Maintain metformin diet exercise discussed  3.  Reactive airways approaching asthma diagnosis present with patient.  Protracted wheezy cough this winter with illness.  Now protracted wheezy cough this spring after respiratory illness.  Use albuterol as needed if persists call back and we will add steroid inhaler rationale discussed.  Follow-up in 6 months.

## 2017-11-22 ENCOUNTER — Emergency Department (HOSPITAL_COMMUNITY)
Admission: EM | Admit: 2017-11-22 | Discharge: 2017-11-22 | Disposition: A | Discharge status: Home / Self Care | Payer: BLUE CROSS/BLUE SHIELD | Attending: Physician Assistant | Admitting: Physician Assistant

## 2017-11-22 ENCOUNTER — Emergency Department (HOSPITAL_COMMUNITY): Payer: BLUE CROSS/BLUE SHIELD

## 2017-11-22 ENCOUNTER — Other Ambulatory Visit: Payer: Self-pay

## 2017-11-22 ENCOUNTER — Encounter (HOSPITAL_COMMUNITY): Payer: Self-pay | Admitting: Emergency Medicine

## 2017-11-22 DIAGNOSIS — R519 Headache, unspecified: Secondary | ICD-10-CM

## 2017-11-22 DIAGNOSIS — S299XXA Unspecified injury of thorax, initial encounter: Secondary | ICD-10-CM | POA: Diagnosis not present

## 2017-11-22 DIAGNOSIS — S5012XA Contusion of left forearm, initial encounter: Secondary | ICD-10-CM | POA: Diagnosis not present

## 2017-11-22 DIAGNOSIS — E785 Hyperlipidemia, unspecified: Secondary | ICD-10-CM | POA: Diagnosis not present

## 2017-11-22 DIAGNOSIS — Y92410 Unspecified street and highway as the place of occurrence of the external cause: Secondary | ICD-10-CM | POA: Diagnosis not present

## 2017-11-22 DIAGNOSIS — E119 Type 2 diabetes mellitus without complications: Secondary | ICD-10-CM | POA: Diagnosis not present

## 2017-11-22 DIAGNOSIS — G930 Cerebral cysts: Secondary | ICD-10-CM | POA: Insufficient documentation

## 2017-11-22 DIAGNOSIS — R0789 Other chest pain: Secondary | ICD-10-CM | POA: Insufficient documentation

## 2017-11-22 DIAGNOSIS — Y998 Other external cause status: Secondary | ICD-10-CM | POA: Diagnosis not present

## 2017-11-22 DIAGNOSIS — M79632 Pain in left forearm: Secondary | ICD-10-CM | POA: Diagnosis not present

## 2017-11-22 DIAGNOSIS — M25562 Pain in left knee: Secondary | ICD-10-CM | POA: Diagnosis not present

## 2017-11-22 DIAGNOSIS — Y939 Activity, unspecified: Secondary | ICD-10-CM | POA: Insufficient documentation

## 2017-11-22 DIAGNOSIS — Z79899 Other long term (current) drug therapy: Secondary | ICD-10-CM | POA: Diagnosis not present

## 2017-11-22 DIAGNOSIS — Z7984 Long term (current) use of oral hypoglycemic drugs: Secondary | ICD-10-CM | POA: Diagnosis not present

## 2017-11-22 DIAGNOSIS — R51 Headache: Secondary | ICD-10-CM | POA: Insufficient documentation

## 2017-11-22 DIAGNOSIS — Q046 Congenital cerebral cysts: Secondary | ICD-10-CM

## 2017-11-22 DIAGNOSIS — S8002XA Contusion of left knee, initial encounter: Secondary | ICD-10-CM | POA: Diagnosis not present

## 2017-11-22 MED ORDER — ACETAMINOPHEN 500 MG PO TABS
1000.0000 mg | ORAL_TABLET | Freq: Once | ORAL | Status: AC
  Administered 2017-11-22: 1000 mg via ORAL
  Filled 2017-11-22: qty 2

## 2017-11-22 MED ORDER — CYCLOBENZAPRINE HCL 10 MG PO TABS: 10.0000 mg | ORAL_TABLET | Freq: Two times a day (BID) | ORAL | 0 refills | Status: DC | PRN

## 2017-11-22 MED ORDER — CYCLOBENZAPRINE HCL 10 MG PO TABS
10.0000 mg | ORAL_TABLET | Freq: Once | ORAL | Status: AC
  Administered 2017-11-22: 10 mg via ORAL
  Filled 2017-11-22: qty 1
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
## 2017-11-22 NOTE — ED Notes (Signed)
Patient transported to X-ray 

## 2017-11-22 NOTE — Discharge Instructions (Signed)
Alternate 600 mg of ibuprofen and [id number] mg of Tylenol every 3 hours as needed for pain. Do not exceed 4000 mg of Tylenol daily. You may take Flexeril up to twice daily as needed for muscle spasms. This medication may make you drowsy, so I typically only recommended at night. If this medication makes you drowsy throughout the day, no driving, drinking alcohol, or operating heavy machinery. You may also cut these tablets in half. Ice to areas of soreness for the next few days and then may move to heat. Do some gentle stretching throughout the day, especially during hot showers or baths. Take short frequent walks and avoid prolonged periods of sitting or laying. Expect to be sore for the next few day and follow up with primary care physician for recheck of ongoing symptoms but return to ER for emergent changing or worsening of symptoms such as severe headache that gets worse, altered mental status/behaving unusually, persistent vomiting, excessive drowsiness, numbness to the arms or legs, unsteady gait, or slurred speech.
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
## 2017-12-02 ENCOUNTER — Other Ambulatory Visit: Payer: Self-pay | Admitting: Family Medicine

## 2017-12-02 DIAGNOSIS — Z0289 Encounter for other administrative examinations: Secondary | ICD-10-CM

## 2017-12-03 ENCOUNTER — Ambulatory Visit (INDEPENDENT_AMBULATORY_CARE_PROVIDER_SITE_OTHER): Payer: BLUE CROSS/BLUE SHIELD | Admitting: Family Medicine

## 2017-12-03 ENCOUNTER — Encounter: Payer: Self-pay | Admitting: Family Medicine

## 2017-12-03 ENCOUNTER — Telehealth: Payer: Self-pay | Admitting: Family Medicine

## 2017-12-03 VITALS — BP 112/80 | Ht 74.0 in | Wt 252.0 lb

## 2017-12-03 DIAGNOSIS — S060X1D Concussion with loss of consciousness of 30 minutes or less, subsequent encounter: Secondary | ICD-10-CM | POA: Diagnosis not present

## 2017-12-03 DIAGNOSIS — R51 Headache: Secondary | ICD-10-CM | POA: Diagnosis not present

## 2017-12-03 DIAGNOSIS — M25562 Pain in left knee: Secondary | ICD-10-CM | POA: Diagnosis not present

## 2017-12-03 DIAGNOSIS — M25561 Pain in right knee: Secondary | ICD-10-CM

## 2017-12-03 MED ORDER — MELOXICAM 15 MG PO TABS: 15.0000 mg | ORAL_TABLET | Freq: Every day | ORAL | 0 refills | Status: DC

## 2017-12-03 NOTE — Telephone Encounter (Signed)
Patient brought in FMLA to be filled out. I filled in what I could. He states needs to be faxed in by 7/12.Please review form and fill in,date,sign and get front to fax and make copy for Epic.In your yellow folder with note on front.
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
## 2017-12-08 ENCOUNTER — Encounter: Payer: Self-pay | Admitting: Orthopedic Surgery

## 2017-12-08 ENCOUNTER — Ambulatory Visit (INDEPENDENT_AMBULATORY_CARE_PROVIDER_SITE_OTHER): Payer: BLUE CROSS/BLUE SHIELD

## 2017-12-08 ENCOUNTER — Ambulatory Visit (INDEPENDENT_AMBULATORY_CARE_PROVIDER_SITE_OTHER): Payer: BLUE CROSS/BLUE SHIELD | Admitting: Orthopedic Surgery

## 2017-12-08 VITALS — BP 126/89 | HR 82 | Ht 74.0 in | Wt 250.0 lb

## 2017-12-08 DIAGNOSIS — M25561 Pain in right knee: Secondary | ICD-10-CM

## 2017-12-08 DIAGNOSIS — M23322 Other meniscus derangements, posterior horn of medial meniscus, left knee: Secondary | ICD-10-CM

## 2017-12-08 DIAGNOSIS — M25562 Pain in left knee: Secondary | ICD-10-CM | POA: Diagnosis not present
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
## 2017-12-08 NOTE — Addendum Note (Signed)
Addended by: Arther Abbott E on: 12/08/2017 02:40 PM   Modules accepted: Level of Service

## 2017-12-09 ENCOUNTER — Telehealth: Payer: Self-pay | Admitting: Radiology

## 2017-12-09 NOTE — Telephone Encounter (Signed)
Called patient with MRI information and have made follow up

## 2017-12-11 ENCOUNTER — Ambulatory Visit (HOSPITAL_COMMUNITY)
Admission: RE | Admit: 2017-12-11 | Discharge: 2017-12-11 | Disposition: A | Discharge status: Home / Self Care | Payer: BLUE CROSS/BLUE SHIELD | Source: Ambulatory Visit | Attending: Orthopedic Surgery | Admitting: Orthopedic Surgery

## 2017-12-11 ENCOUNTER — Other Ambulatory Visit: Payer: Self-pay | Admitting: Orthopedic Surgery

## 2017-12-11 DIAGNOSIS — Z9289 Personal history of other medical treatment: Secondary | ICD-10-CM | POA: Diagnosis not present

## 2017-12-11 DIAGNOSIS — X58XXXA Exposure to other specified factors, initial encounter: Secondary | ICD-10-CM | POA: Diagnosis not present

## 2017-12-11 DIAGNOSIS — M25562 Pain in left knee: Secondary | ICD-10-CM

## 2017-12-11 DIAGNOSIS — Z7689 Persons encountering health services in other specified circumstances: Secondary | ICD-10-CM | POA: Insufficient documentation

## 2017-12-11 DIAGNOSIS — M23322 Other meniscus derangements, posterior horn of medial meniscus, left knee: Secondary | ICD-10-CM

## 2017-12-11 DIAGNOSIS — S83242A Other tear of medial meniscus, current injury, left knee, initial encounter: Secondary | ICD-10-CM | POA: Insufficient documentation

## 2017-12-11 DIAGNOSIS — Z136 Encounter for screening for cardiovascular disorders: Secondary | ICD-10-CM | POA: Diagnosis not present

## 2017-12-11 DIAGNOSIS — R609 Edema, unspecified: Secondary | ICD-10-CM | POA: Diagnosis not present

## 2017-12-11 NOTE — Telephone Encounter (Signed)
This was faxed.  Copy made for chart.

## 2017-12-11 NOTE — Telephone Encounter (Signed)
Patient notified

## 2017-12-12 ENCOUNTER — Ambulatory Visit (INDEPENDENT_AMBULATORY_CARE_PROVIDER_SITE_OTHER): Payer: BLUE CROSS/BLUE SHIELD | Admitting: Orthopedic Surgery

## 2017-12-12 ENCOUNTER — Encounter: Payer: Self-pay | Admitting: Orthopedic Surgery

## 2017-12-12 VITALS — BP 127/87 | HR 70 | Ht 74.0 in | Wt 250.0 lb

## 2017-12-12 DIAGNOSIS — M1712 Unilateral primary osteoarthritis, left knee: Secondary | ICD-10-CM

## 2017-12-12 DIAGNOSIS — M23322 Other meniscus derangements, posterior horn of medial meniscus, left knee: Secondary | ICD-10-CM
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
## 2017-12-12 NOTE — Patient Instructions (Signed)
Meniscus Injury, Arthroscopy Arthroscopy is a surgical procedure that involves the use of a small scope that has a camera and surgical instruments on the end (arthroscope). An arthroscope can be used to repair your meniscus injury.  LET [facility] CARE PROVIDER KNOW ABOUT:  Any allergies you have.  All medicines you are taking, including vitamins, herbs, eyedrops, creams, and over-the-counter medicines.  Any recent colds or infections you have had or currently have.  Previous problems you or members of your family have had with the use of anesthetics.  Any blood disorders or blood clotting problems you have.  Previous surgeries you have had.  Medical conditions you have. RISKS AND COMPLICATIONS Generally, this is a safe procedure. However, as with any procedure, problems can occur. Possible problems include:  Damage to nerves or blood vessels.  Excess bleeding.  Blood clots.  Infection. BEFORE THE PROCEDURE  Do not eat or drink for 6-8 hours before the procedure.  Take medicines as directed by your surgeon. Ask your surgeon about changing or stopping your regular medicines.  You may have lab tests the morning of surgery. PROCEDURE  You will be given one of the following:   A medicine that numbs the area (local anesthesia).  A medicine that makes you go to sleep (general anesthesia).  A medicine injected into your spine that numbs your body below the waist (spinal anesthesia). Most often, several small cuts (incisions) are made in the knee. The arthroscope and instruments go into the incisions to repair the damage. The torn portion of the meniscus is removed.   AFTER THE PROCEDURE  You will be taken to the recovery area where your progress will be monitored. When you are awake, stable, and taking fluids without complications, you will be allowed to go home. This is usually the same day. A torn or stretched ligament (ligament sprain) may take 6-8 weeks to heal.   It  takes about the 4-6 WEEKS if your surgeon removed a torn meniscus.  A repaired meniscus may require 6-12 weeks of recovery time.  A torn ligament needing reconstructive surgery may take 6-12 months to heal fully.   This information is not intended to replace advice given to you by your health care provider. Make sure you discuss any questions you have with your health care provider. You have decided to proceed with operative arthroscopy of the knee. You have decided not to continue with nonoperative measures such as but not limited to oral medication, weight loss, activity modification, physical therapy, bracing, or injection.  We will perform operative arthroscopy of the knee. Some of the risks associated with arthroscopic surgery of the knee include but are not limited to Bleeding Infection Swelling Stiffness Blood clot Pain  If you're not comfortable with these risks and would like to continue with nonoperative treatment please let Dr. Aline Brochure know prior to your surgery.   Document Released: 05/17/2000 Document Revised: 05/25/2013 Document Reviewed: 10/16/2012 Elsevier Interactive Patient Education 2016 [place] have decided to proceed with operative arthroscopy of the knee. You have decided not to continue with nonoperative measures such as but not limited to oral medication, weight loss, activity modification, physical therapy, bracing, or injection.  In compliance with recent [place] law in federal regulation regarding opioid use and abuse and addiction, we will taper (stop) opioid medication after 2 weeks.  We will perform operative arthroscopy of the knee. Some of the risks associated with arthroscopic surgery of the knee include but are not limited to  Bleeding Infection Swelling Stiffness Blood clot Pain  If you're not comfortable with these risks and would like to continue with nonoperative treatment please let Dr. Aline Brochure know prior to your surgery.

## 2017-12-30 ENCOUNTER — Other Ambulatory Visit: Payer: Self-pay | Admitting: Family Medicine

## 2018-01-05 ENCOUNTER — Ambulatory Visit (INDEPENDENT_AMBULATORY_CARE_PROVIDER_SITE_OTHER): Payer: BLUE CROSS/BLUE SHIELD | Admitting: Family Medicine

## 2018-01-05 ENCOUNTER — Encounter: Payer: Self-pay | Admitting: Family Medicine

## 2018-01-05 VITALS — BP 122/80 | Ht 74.0 in | Wt 250.0 lb

## 2018-01-05 DIAGNOSIS — M25532 Pain in left wrist: Secondary | ICD-10-CM | POA: Diagnosis not present

## 2018-01-05 DIAGNOSIS — M25562 Pain in left knee: Secondary | ICD-10-CM

## 2018-01-05 DIAGNOSIS — S060X1D Concussion with loss of consciousness of 30 minutes or less, subsequent encounter: Secondary | ICD-10-CM

## 2018-01-05 NOTE — Patient Instructions (Signed)
Paul Barnes  01/05/2018     @PREFPERIOPPHARMACY @   Your procedure is scheduled on  01/15/2018 .  Report to [facility] at  945   A.M.  Call this number if you have problems the morning of surgery:  [phone]   Remember:  Do not eat or drink after midnight.  You may drink clear liquids until  12 midnight 01/14/2018 .  Clear liquids allowed are:                    Water, Juice (non-citric and without pulp), Carbonated beverages, Clear Tea, Black Coffee only, Plain Jell-O only, Gatorade and Plain Popsicles only    Take these medicines the morning of surgery with A SIP OF WATER  Mobic. Use your inhaler before you come.    Do not wear jewelry, make-up or nail polish.  Do not wear lotions, powders, or perfumes, or deodorant.  Do not shave 48 hours prior to surgery.  Men may shave face and neck.  Do not bring valuables to the hospital.  [facility] is not responsible for any belongings or valuables.  Contacts, dentures or bridgework may not be worn into surgery.  Leave your suitcase in the car.  After surgery it may be brought to your room.  For patients admitted to the hospital, discharge time will be determined by your treatment team.  Patients discharged the day of surgery will not be allowed to drive home.   Name and phone number of your driver:   family Special instructions:  None  Please read over the following fact sheets that you were given. Anesthesia Post-op Instructions and Care and Recovery After Surgery      Knee Ligament Injury, Arthroscopy Arthroscopy is a surgical technique in which your health care provider examines your knee through a small, pencil-sized telescope (arthroscope). Often, repairs to injured ligaments can be done with instruments in the arthroscope. Arthroscopy is less invasive than open-knee surgery. Tell a health care provider about:  Any allergies you have.  All medicines you are taking, including vitamins, herbs, eye  drops, creams, and over-the-counter medicines.  Any problems you or family members have had with anesthetic medicines.  Any blood disorders you have.  Any surgeries you have had.  Any medical conditions you have. What are the risks? Generally, this is a safe procedure. However, as with any procedure, problems can occur. Possible problems include:  Infection.  Bleeding.  Stiffness.  What happens before the procedure?  Ask your health care provider about changing or stopping any regular medicines. Avoid taking aspirin or blood thinners as directed by your health care provider.  Do not eat or drink anything after midnight the night before surgery.  If you smoke, do not smoke for at least 2 weeks before your surgery.  Do not drink alcohol starting the day before your surgery.  Let your health care provider know if you develop a cold or any infection before your surgery.  Arrange for someone to drive you home after the surgery or after your hospital stay. Also arrange for someone to help you with activities during recovery. What happens during the procedure?  Small monitors will be put on your body. They are used to check your heart, blood pressure, and oxygen levels.  An IV access tube will be put into one of your veins. Medicine will be able to flow directly into your body through this IV tube.  You might be given a medicine to help you relax (sedative).  You will be given a medicine that makes you go to sleep (general anesthetic), and a breathing tube will be placed into your lungs during the procedure.  Several small incisions are made in your knee. Saline fluid is placed into one of the incisions to expand the knee and clear away any blood in the knee.  Your health care provider will insert the arthroscope to examine the injured knee.  During arthroscopy, your health care provider may find a partial or complete tear in a ligament.  Tools can be inserted through the  other incisions to repair the injured ligaments.  The incisions are then closed with absorbable stitches and covered with dressings. What happens after the procedure?  You will be taken to the recovery area where you will be monitored.  When you are awake, stable, and taking fluids without problems, you will be allowed to go home. This information is not intended to replace advice given to you by your health care provider. Make sure you discuss any questions you have with your health care provider. Document Released: 05/17/2000 Document Revised: 10/26/2015 Document Reviewed: 12/30/2012 Elsevier Interactive Patient Education  2017 [employer].  Arthroscopic Knee Ligament Repair, Care After This sheet gives you information about how to care for yourself after your procedure. Your health care provider may also give you more specific instructions. If you have problems or questions, contact your health care provider. What can I expect after the procedure? After the procedure, it is common to have:  Pain in your knee.  Bruising and swelling on your knee, calf, and ankle for 3-4 days.  Fatigue.  Follow these instructions at home: If you have a brace or immobilizer:  Wear the brace or immobilizer as told by your health care provider. Remove it only as told by your health care provider.  Loosen the splint or immobilizer if your toes tingle, become numb, or turn cold and blue.  Keep the brace or immobilizer clean. Bathing  Do not take baths, swim, or use a hot tub until your health care provider approves. Ask your health care provider if you can take showers.  Keep your bandage (dressing) dry until your health care provider says that it can be removed. Cover it and your brace or immobilizer with a watertight covering when you take a shower. Incision care  Follow instructions from your health care provider about how to take care of your incision. Make sure you: ? Wash your hands with  soap and water before you change your bandage (dressing). If soap and water are not available, use hand sanitizer. ? Change your dressing as told by your health care provider. ? Leave stitches (sutures), skin glue, or adhesive strips in place. These skin closures may need to stay in place for 2 weeks or longer. If adhesive strip edges start to loosen and curl up, you may trim the loose edges. Do not remove adhesive strips completely unless your health care provider tells you to do that.  Check your incision area every day for signs of infection. Check for: ? More redness, swelling, or pain. ? More fluid or blood. ? Warmth. ? Pus or a bad smell. Managing pain, stiffness, and swelling  If directed, put ice on the affected area. ? If you have a removable brace or immobilizer, remove it as told by your health care provider. ? Put ice in a plastic bag. ? Place a towel between  your skin and the bag or between your brace or immobilizer and the bag. ? Leave the ice on for 20 minutes, 2-3 times a day.  Move your toes often to avoid stiffness and to lessen swelling.  Raise (elevate) the injured area above the level of your heart while you are sitting or lying down. Driving  Do not drive until your health care provider approves. If you have a brace or immobilizer on your leg, ask your health care provider when it is safe for you to drive.  Do not drive or use heavy machinery while taking prescription pain medicine. Activity  Rest as directed. Ask your health care provider what activities are safe for you.  Do physical therapy exercises as told by your health care provider. Physical therapy will help you regain strength and motion in your knee.  Follow instructions from your health care provider about: ? When you may start motion exercises. ? When you may start riding a stationary bike and doing other low-impact activities. ? When you may start to jog and do other high-impact  activities. Safety  Do not use the injured limb to support your body weight until your health care provider says that you can. Use crutches as told by your health care provider. General instructions  Do not use any products that contain nicotine or tobacco, such as cigarettes and e-cigarettes. These can delay bone healing. If you need help quitting, ask your health care provider.  To prevent or treat constipation while you are taking prescription pain medicine, your health care provider may recommend that you: ? Drink enough fluid to keep your urine clear or pale yellow. ? Take over-the-counter or prescription medicines. ? Eat foods that are high in fiber, such as fresh fruits and vegetables, whole grains, and beans. ? Limit foods that are high in fat and processed sugars, such as fried and sweet foods.  Take over-the-counter and prescription medicines only as told by your health care provider.  Keep all follow-up visits as told by your health care provider. This is important. Contact a health care provider if:  You have more redness, swelling, or pain around an incision.  You have more fluid or blood coming from an incision.  Your incision feels warm to the touch.  You have a fever.  You have pain or swelling in your knee, and it gets worse.  You have pain that does not get better with medicine. Get help right away if:  You have trouble breathing.  You have pus or a bad smell coming from an incision.  You have numbness and tingling near the knee joint. Summary  After the procedure, it is common to have knee pain with bruising and swelling on your knee, calf, and ankle.  Icing your knee and raising your leg above the level of your heart will help control the pain and the swelling.  Do physical therapy exercises as told by your health care provider. Physical therapy will help you regain strength and motion in your knee. This information is not intended to replace advice  given to you by your health care provider. Make sure you discuss any questions you have with your health care provider. Document Released: 03/10/2013 Document Revised: 05/14/2016 Document Reviewed: 05/14/2016 Elsevier Interactive Patient Education  2017 [place] Anesthesia, Adult General anesthesia is the use of medicines to make a person "go to sleep" (be unconscious) for a medical procedure. General anesthesia is often recommended when a procedure:  Is  long.  Requires you to be still or in an unusual position.  Is major and can cause you to lose blood.  Is impossible to do without general anesthesia.  The medicines used for general anesthesia are called general anesthetics. In addition to making you sleep, the medicines:  Prevent pain.  Control your blood pressure.  Relax your muscles.  Tell a health care provider about:  Any allergies you have.  All medicines you are taking, including vitamins, herbs, eye drops, creams, and over-the-counter medicines.  Any problems you or family members have had with anesthetic medicines.  Types of anesthetics you have had in the past.  Any bleeding disorders you have.  Any surgeries you have had.  Any medical conditions you have.  Any history of heart or lung conditions, such as heart failure, sleep apnea, or chronic obstructive pulmonary disease (COPD).  Whether you are pregnant or may be pregnant.  Whether you use tobacco, alcohol, marijuana, or street drugs.  Any history of Armed forces logistics/support/administrative officer.  Any history of depression or anxiety. What are the risks? Generally, this is a safe procedure. However, problems may occur, including:  Allergic reaction to anesthetics.  Lung and heart problems.  Inhaling food or liquids from your stomach into your lungs (aspiration).  Injury to nerves.  Waking up during your procedure and being unable to move (rare).  Extreme agitation or a state of mental confusion (delirium)  when you wake up from the anesthetic.  Air in the bloodstream, which can lead to stroke.  These problems are more likely to develop if you are having a major surgery or if you have an advanced medical condition. You can prevent some of these complications by answering all of your health care provider's questions thoroughly and by following all pre-procedure instructions. General anesthesia can cause side effects, including:  Nausea or vomiting  A sore throat from the breathing tube.  Feeling cold or shivery.  Feeling tired, washed out, or achy.  Sleepiness or drowsiness.  Confusion or agitation.  What happens before the procedure? Staying hydrated Follow instructions from your health care provider about hydration, which may include:  Up to 2 hours before the procedure - you may continue to drink clear liquids, such as water, clear fruit juice, black coffee, and plain tea.  Eating and drinking restrictions Follow instructions from your health care provider about eating and drinking, which may include:  8 hours before the procedure - stop eating heavy meals or foods such as meat, fried foods, or fatty foods.  6 hours before the procedure - stop eating light meals or foods, such as toast or cereal.  6 hours before the procedure - stop drinking milk or drinks that contain milk.  2 hours before the procedure - stop drinking clear liquids.  Medicines  Ask your health care provider about: ? Changing or stopping your regular medicines. This is especially important if you are taking diabetes medicines or blood thinners. ? Taking medicines such as aspirin and ibuprofen. These medicines can thin your blood. Do not take these medicines before your procedure if your health care provider instructs you not to. ? Taking new dietary supplements or medicines. Do not take these during the week before your procedure unless your health care provider approves them.  If you are told to take a  medicine or to continue taking a medicine on the day of the procedure, take the medicine with sips of water. General instructions   Ask if you will be going  home the same day, the following day, or after a longer hospital stay. ? Plan to have someone take you home. ? Plan to have someone stay with you for the first 24 hours after you leave the hospital or clinic.  For 3-6 weeks before the procedure, try not to use any tobacco products, such as cigarettes, chewing tobacco, and e-cigarettes.  You may brush your teeth on the morning of the procedure, but make sure to spit out the toothpaste. What happens during the procedure?  You will be given anesthetics through a mask and through an IV tube in one of your veins.  You may receive medicine to help you relax (sedative).  As soon as you are asleep, a breathing tube may be used to help you breathe.  An anesthesia specialist will stay with you throughout the procedure. He or she will help keep you comfortable and safe by continuing to give you medicines and adjusting the amount of medicine that you get. He or she will also watch your blood pressure, pulse, and oxygen levels to make sure that the anesthetics do not cause any problems.  If a breathing tube was used to help you breathe, it will be removed before you wake up. The procedure may vary among health care providers and hospitals. What happens after the procedure?  You will wake up, often slowly, after the procedure is complete, usually in a recovery area.  Your blood pressure, heart rate, breathing rate, and blood oxygen level will be monitored until the medicines you were given have worn off.  You may be given medicine to help you calm down if you feel anxious or agitated.  If you will be going home the same day, your health care provider may check to make sure you can stand, drink, and urinate.  Your health care providers will treat your pain and side effects before you go  home.  Do not drive for 24 hours if you received a sedative.  You may: ? Feel nauseous and vomit. ? Have a sore throat. ? Have mental slowness. ? Feel cold or shivery. ? Feel sleepy. ? Feel tired. ? Feel sore or achy, even in parts of your body where you did not have surgery. This information is not intended to replace advice given to you by your health care provider. Make sure you discuss any questions you have with your health care provider. Document Released: 08/27/2007 Document Revised: 10/31/2015 Document Reviewed: 05/04/2015 Elsevier Interactive Patient Education  2018 [place] Anesthesia, Adult, Care After These instructions provide you with information about caring for yourself after your procedure. Your health care provider may also give you more specific instructions. Your treatment has been planned according to current medical practices, but problems sometimes occur. Call your health care provider if you have any problems or questions after your procedure. What can I expect after the procedure? After the procedure, it is common to have:  Vomiting.  A sore throat.  Mental slowness.  It is common to feel:  Nauseous.  Cold or shivery.  Sleepy.  Tired.  Sore or achy, even in parts of your body where you did not have surgery.  Follow these instructions at home: For at least 24 hours after the procedure:  Do not: ? Participate in activities where you could fall or become injured. ? Drive. ? Use heavy machinery. ? Drink alcohol. ? Take sleeping pills or medicines that cause drowsiness. ? Make important decisions or sign legal documents. ? Take  care of children on your own.  Rest. Eating and drinking  If you vomit, drink water, juice, or soup when you can drink without vomiting.  Drink enough fluid to keep your urine clear or pale yellow.  Make sure you have little or no nausea before eating solid foods.  Follow the diet recommended by your  health care provider. General instructions  Have a responsible adult stay with you until you are awake and alert.  Return to your normal activities as told by your health care provider. Ask your health care provider what activities are safe for you.  Take over-the-counter and prescription medicines only as told by your health care provider.  If you smoke, do not smoke without supervision.  Keep all follow-up visits as told by your health care provider. This is important. Contact a health care provider if:  You continue to have nausea or vomiting at home, and medicines are not helpful.  You cannot drink fluids or start eating again.  You cannot urinate after 8-12 hours.  You develop a skin rash.  You have fever.  You have increasing redness at the site of your procedure. Get help right away if:  You have difficulty breathing.  You have chest pain.  You have unexpected bleeding.  You feel that you are having a life-threatening or urgent problem. This information is not intended to replace advice given to you by your health care provider. Make sure you discuss any questions you have with your health care provider. Document Released: 08/26/2000 Document Revised: 10/23/2015 Document Reviewed: 05/04/2015 Elsevier Interactive Patient Education  [employer].
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
## 2018-01-06 ENCOUNTER — Telehealth: Payer: Self-pay | Admitting: Radiology

## 2018-01-06 NOTE — Telephone Encounter (Signed)
Erick today for prior authorization for surgery. CPT [id number] no authorization is required spoke to [place] reference number for the call is [id number]

## 2018-01-07 ENCOUNTER — Telehealth: Payer: Self-pay | Admitting: Radiology

## 2018-01-07 NOTE — Telephone Encounter (Signed)
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

## 2018-01-08 ENCOUNTER — Other Ambulatory Visit: Payer: Self-pay

## 2018-01-08 ENCOUNTER — Encounter (HOSPITAL_COMMUNITY)
Admission: RE | Admit: 2018-01-08 | Discharge: 2018-01-08 | Disposition: A | Discharge status: Home / Self Care | Payer: BLUE CROSS/BLUE SHIELD | Source: Ambulatory Visit | Attending: Orthopedic Surgery | Admitting: Orthopedic Surgery

## 2018-01-08 ENCOUNTER — Encounter (HOSPITAL_COMMUNITY): Payer: Self-pay

## 2018-01-08 DIAGNOSIS — Z01812 Encounter for preprocedural laboratory examination: Secondary | ICD-10-CM | POA: Insufficient documentation

## 2018-01-08 HISTORY — DX: Personal history of urinary calculi: Z87.442

## 2018-01-08 HISTORY — DX: Unspecified osteoarthritis, unspecified site: M19.90

## 2018-01-08 LAB — CBC WITH DIFFERENTIAL/PLATELET
BASOS PCT: 0 %
Basophils Absolute: 0 10*3/uL (ref 0.0–0.1)
EOS ABS: 0.2 10*3/uL (ref 0.0–0.7)
Eosinophils Relative: 4 %
HCT: 44.9 % (ref 39.0–52.0)
HEMOGLOBIN: 15.3 g/dL (ref 13.0–17.0)
Lymphocytes Relative: 35 %
Lymphs Abs: 1.9 10*3/uL (ref 0.7–4.0)
MCH: 31.6 pg (ref 26.0–34.0)
MCHC: 34.1 g/dL (ref 30.0–36.0)
MCV: 92.8 fL (ref 78.0–100.0)
MONO ABS: 0.5 10*3/uL (ref 0.1–1.0)
MONOS PCT: 9 %
NEUTROS PCT: 52 %
Neutro Abs: 2.8 10*3/uL (ref 1.7–7.7)
Platelets: 240 10*3/uL (ref 150–400)
RBC: 4.84 MIL/uL (ref 4.22–5.81)
RDW: 12.3 % (ref 11.5–15.5)
WBC: 5.3 10*3/uL (ref 4.0–10.5)

## 2018-01-08 LAB — BASIC METABOLIC PANEL
Anion gap: 6 (ref 5–15)
BUN: 15 mg/dL (ref 6–20)
CALCIUM: 8.8 mg/dL — AB (ref 8.9–10.3)
CO2: 24 mmol/L (ref 22–32)
CREATININE: 0.86 mg/dL (ref 0.61–1.24)
Chloride: 108 mmol/L (ref 98–111)
Glucose, Bld: 154 mg/dL — ABNORMAL HIGH (ref 70–99)
Potassium: 3.9 mmol/L (ref 3.5–5.1)
Sodium: 138 mmol/L (ref 135–145)

## 2018-01-08 LAB — HEMOGLOBIN A1C
HEMOGLOBIN A1C: 8.1 % — AB (ref 4.8–5.6)
MEAN PLASMA GLUCOSE: 185.77 mg/dL

## 2018-01-08 LAB — GLUCOSE, CAPILLARY: GLUCOSE-CAPILLARY: 141 mg/dL — AB (ref 70–99)

## 2018-01-09 NOTE — Pre-Procedure Instructions (Signed)
HgbA1C routed to PCP. 
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
## 2018-01-14 ENCOUNTER — Telehealth: Payer: Self-pay | Admitting: Radiology

## 2018-01-14 NOTE — Telephone Encounter (Signed)
Spoke to [place] [facility] at [facility] no prior Josem Kaufmann is required for the Knee arthroscopy tomorrow. CPT code [phone]. Ref number for the call is his name and todays date.

## 2018-01-15 ENCOUNTER — Encounter (HOSPITAL_COMMUNITY): Payer: Self-pay | Admitting: *Deleted

## 2018-01-15 ENCOUNTER — Ambulatory Visit (HOSPITAL_COMMUNITY): Payer: BLUE CROSS/BLUE SHIELD | Admitting: Anesthesiology

## 2018-01-15 ENCOUNTER — Ambulatory Visit (HOSPITAL_COMMUNITY)
Admission: RE | Admit: 2018-01-15 | Discharge: 2018-01-15 | Disposition: A | Discharge status: Home / Self Care | Payer: BLUE CROSS/BLUE SHIELD | Source: Ambulatory Visit | Attending: Orthopedic Surgery | Admitting: Orthopedic Surgery

## 2018-01-15 ENCOUNTER — Encounter (HOSPITAL_COMMUNITY)
Admission: RE | Disposition: A | Discharge status: Home / Self Care | Payer: Self-pay | Source: Ambulatory Visit | Attending: Orthopedic Surgery

## 2018-01-15 DIAGNOSIS — M25561 Pain in right knee: Secondary | ICD-10-CM | POA: Diagnosis not present

## 2018-01-15 DIAGNOSIS — M2242 Chondromalacia patellae, left knee: Secondary | ICD-10-CM | POA: Diagnosis not present

## 2018-01-15 DIAGNOSIS — E785 Hyperlipidemia, unspecified: Secondary | ICD-10-CM | POA: Insufficient documentation

## 2018-01-15 DIAGNOSIS — M94262 Chondromalacia, left knee: Secondary | ICD-10-CM | POA: Diagnosis not present

## 2018-01-15 DIAGNOSIS — S83242A Other tear of medial meniscus, current injury, left knee, initial encounter: Secondary | ICD-10-CM | POA: Diagnosis not present

## 2018-01-15 DIAGNOSIS — M9428 Chondromalacia, other site: Secondary | ICD-10-CM | POA: Diagnosis not present

## 2018-01-15 DIAGNOSIS — M1712 Unilateral primary osteoarthritis, left knee: Secondary | ICD-10-CM | POA: Diagnosis not present

## 2018-01-15 DIAGNOSIS — E119 Type 2 diabetes mellitus without complications: Secondary | ICD-10-CM | POA: Diagnosis not present

## 2018-01-15 DIAGNOSIS — M25562 Pain in left knee: Secondary | ICD-10-CM | POA: Insufficient documentation

## 2018-01-15 HISTORY — PX: KNEE ARTHROSCOPY WITH MEDIAL MENISECTOMY: SHX5651

## 2018-01-15 LAB — GLUCOSE, CAPILLARY
GLUCOSE-CAPILLARY: 166 mg/dL — AB (ref 70–99)
Glucose-Capillary: 167 mg/dL — ABNORMAL HIGH (ref 70–99)

## 2018-01-15 SURGERY — ARTHROSCOPY, KNEE, WITH MEDIAL MENISCECTOMY
Anesthesia: General | Site: Knee | Laterality: Left

## 2018-01-15 MED ORDER — HYDROMORPHONE HCL 1 MG/ML IJ SOLN: 0.2500 mg | INTRAMUSCULAR | Status: DC | PRN

## 2018-01-15 MED ORDER — MIDAZOLAM HCL 2 MG/2ML IJ SOLN
INTRAMUSCULAR | Status: AC
  Filled 2018-01-15: qty 2

## 2018-01-15 MED ORDER — ONDANSETRON HCL 4 MG/2ML IJ SOLN
INTRAMUSCULAR | Status: AC
  Filled 2018-01-15: qty 2

## 2018-01-15 MED ORDER — FENTANYL CITRATE (PF) 100 MCG/2ML IJ SOLN
INTRAMUSCULAR | Status: AC
  Filled 2018-01-15: qty 4

## 2018-01-15 MED ORDER — HYDROCODONE-ACETAMINOPHEN 5-325 MG PO TABS: 1.0000 | ORAL_TABLET | ORAL | 0 refills | Status: DC | PRN

## 2018-01-15 MED ORDER — BUPIVACAINE-EPINEPHRINE (PF) 0.5% -1:200000 IJ SOLN
INTRAMUSCULAR | Status: AC
  Filled 2018-01-15: qty 60

## 2018-01-15 MED ORDER — ONDANSETRON HCL 4 MG/2ML IJ SOLN
INTRAMUSCULAR | Status: DC | PRN
  Administered 2018-01-15: 4 mg via INTRAVENOUS

## 2018-01-15 MED ORDER — PROPOFOL 10 MG/ML IV BOLUS
INTRAVENOUS | Status: AC
  Filled 2018-01-15: qty 40

## 2018-01-15 MED ORDER — EPINEPHRINE PF 1 MG/ML IJ SOLN
INTRAMUSCULAR | Status: AC
Start: 2018-01-15 — End: ?
  Filled 2018-01-15: qty 5

## 2018-01-15 MED ORDER — CEFAZOLIN SODIUM-DEXTROSE 2-4 GM/100ML-% IV SOLN
2.0000 g | INTRAVENOUS | Status: AC
  Administered 2018-01-15: 2 g via INTRAVENOUS
  Filled 2018-01-15: qty 100

## 2018-01-15 MED ORDER — SODIUM CHLORIDE 0.9% FLUSH
INTRAVENOUS | Status: AC
  Filled 2018-01-15: qty 30

## 2018-01-15 MED ORDER — FENTANYL CITRATE (PF) 100 MCG/2ML IJ SOLN
INTRAMUSCULAR | Status: DC | PRN
  Administered 2018-01-15 (×2): 50 ug via INTRAVENOUS

## 2018-01-15 MED ORDER — LIDOCAINE HCL 1 % IJ SOLN
INTRAMUSCULAR | Status: DC | PRN
  Administered 2018-01-15: 30 mg via INTRADERMAL

## 2018-01-15 MED ORDER — ONDANSETRON HCL 4 MG/2ML IJ SOLN: 4.0000 mg | Freq: Once | INTRAMUSCULAR | Status: DC | PRN

## 2018-01-15 MED ORDER — BUPIVACAINE-EPINEPHRINE (PF) 0.5% -1:200000 IJ SOLN
INTRAMUSCULAR | Status: DC | PRN
  Administered 2018-01-15: 60 mL via PERINEURAL

## 2018-01-15 MED ORDER — SUCCINYLCHOLINE CHLORIDE 20 MG/ML IJ SOLN
INTRAMUSCULAR | Status: AC
  Filled 2018-01-15: qty 1

## 2018-01-15 MED ORDER — EPINEPHRINE PF 1 MG/ML IJ SOLN
INTRAMUSCULAR | Status: DC | PRN
  Administered 2018-01-15 (×3): 3000 mL

## 2018-01-15 MED ORDER — ACETAMINOPHEN 500 MG PO TABS: 1000.0000 mg | ORAL_TABLET | Freq: Once | ORAL | Status: DC

## 2018-01-15 MED ORDER — IBUPROFEN 800 MG PO TABS
ORAL_TABLET | ORAL | Status: AC
  Filled 2018-01-15: qty 1

## 2018-01-15 MED ORDER — ACETAMINOPHEN 500 MG PO TABS
ORAL_TABLET | ORAL | Status: AC
  Filled 2018-01-15: qty 2

## 2018-01-15 MED ORDER — ONDANSETRON HCL 4 MG/2ML IJ SOLN: 4.0000 mg | Freq: Once | INTRAMUSCULAR | Status: DC

## 2018-01-15 MED ORDER — CHLORHEXIDINE GLUCONATE 4 % EX LIQD: 60.0000 mL | Freq: Once | CUTANEOUS | Status: DC

## 2018-01-15 MED ORDER — KETOROLAC TROMETHAMINE 30 MG/ML IJ SOLN
INTRAMUSCULAR | Status: AC
  Filled 2018-01-15: qty 1

## 2018-01-15 MED ORDER — HYDROCODONE-ACETAMINOPHEN 7.5-325 MG PO TABS
1.0000 | ORAL_TABLET | Freq: Once | ORAL | Status: AC | PRN
  Administered 2018-01-15: 1 via ORAL

## 2018-01-15 MED ORDER — IBUPROFEN 800 MG PO TABS
800.0000 mg | ORAL_TABLET | Freq: Once | ORAL | Status: AC
Start: 2018-01-15 — End: 2018-01-15
  Administered 2018-01-15: 800 mg via ORAL

## 2018-01-15 MED ORDER — KETOROLAC TROMETHAMINE 30 MG/ML IJ SOLN
30.0000 mg | Freq: Once | INTRAMUSCULAR | Status: AC
  Administered 2018-01-15: 30 mg via INTRAVENOUS

## 2018-01-15 MED ORDER — KETOROLAC TROMETHAMINE 30 MG/ML IJ SOLN: 30.0000 mg | Freq: Once | INTRAMUSCULAR | Status: DC | PRN

## 2018-01-15 MED ORDER — LACTATED RINGERS IV SOLN
INTRAVENOUS | Status: DC
  Administered 2018-01-15: 1000 mL via INTRAVENOUS

## 2018-01-15 MED ORDER — MEPERIDINE HCL 50 MG/ML IJ SOLN: 6.2500 mg | INTRAMUSCULAR | Status: DC | PRN

## 2018-01-15 MED ORDER — MIDAZOLAM HCL 5 MG/5ML IJ SOLN
INTRAMUSCULAR | Status: DC | PRN
  Administered 2018-01-15: 2 mg via INTRAVENOUS

## 2018-01-15 MED ORDER — PROPOFOL 10 MG/ML IV BOLUS
INTRAVENOUS | Status: DC | PRN
  Administered 2018-01-15: 200 mg via INTRAVENOUS

## 2018-01-15 MED ORDER — HYDROCODONE-ACETAMINOPHEN 7.5-325 MG PO TABS
ORAL_TABLET | ORAL | Status: AC
  Filled 2018-01-15: qty 1

## 2018-01-15 SURGICAL SUPPLY — 45 items
BANDAGE ELASTIC 6 LF NS (GAUZE/BANDAGES/DRESSINGS) ×2 IMPLANT
BLADE AGGRESSIVE PLUS 4.0 (BLADE) ×2 IMPLANT
BLADE SURG SZ11 CARB STEEL (BLADE) ×2 IMPLANT
BNDG CMPR MED 5X6 ELC HKLP NS (GAUZE/BANDAGES/DRESSINGS) ×1
CHLORAPREP W/TINT 26ML (MISCELLANEOUS) ×2 IMPLANT
CLOTH BEACON ORANGE TIMEOUT ST (SAFETY) ×2 IMPLANT
COOLER CRYO IC GRAV AND TUBE (ORTHOPEDIC SUPPLIES) ×2 IMPLANT
CUFF CRYO KNEE18X23 MED (MISCELLANEOUS) ×2 IMPLANT
CUFF TOURNIQUET SINGLE 34IN LL (TOURNIQUET CUFF) ×2 IMPLANT
DECANTER SPIKE VIAL GLASS SM (MISCELLANEOUS) ×4 IMPLANT
GAUZE 4X4 16PLY RFD (DISPOSABLE) ×2 IMPLANT
GAUZE SPONGE 4X4 12PLY STRL (GAUZE/BANDAGES/DRESSINGS) ×2 IMPLANT
GAUZE XEROFORM 5X9 LF (GAUZE/BANDAGES/DRESSINGS) ×2 IMPLANT
GLOVE BIOGEL PI IND STRL 7.0 (GLOVE) ×3 IMPLANT
GLOVE BIOGEL PI INDICATOR 7.0 (GLOVE) ×3
GLOVE SKINSENSE NS SZ8.0 LF (GLOVE) ×1
GLOVE SKINSENSE STRL SZ8.0 LF (GLOVE) ×1 IMPLANT
GLOVE SS N UNI LF 8.5 STRL (GLOVE) ×2 IMPLANT
GOWN STRL REUS W/ TWL LRG LVL3 (GOWN DISPOSABLE) ×1 IMPLANT
GOWN STRL REUS W/TWL LRG LVL3 (GOWN DISPOSABLE) ×2
GOWN STRL REUS W/TWL XL LVL3 (GOWN DISPOSABLE) ×2 IMPLANT
HLDR LEG FOAM (MISCELLANEOUS) ×1 IMPLANT
IV NS IRRIG 3000ML ARTHROMATIC (IV SOLUTION) ×6 IMPLANT
KIT BLADEGUARD II DBL (SET/KITS/TRAYS/PACK) ×2 IMPLANT
KIT TURNOVER CYSTO (KITS) ×2 IMPLANT
LEG HOLDER FOAM (MISCELLANEOUS) ×1
MANIFOLD NEPTUNE II (INSTRUMENTS) ×2 IMPLANT
MARKER SKIN DUAL TIP RULER LAB (MISCELLANEOUS) ×2 IMPLANT
NEEDLE HYPO 18GX1.5 BLUNT FILL (NEEDLE) ×2 IMPLANT
NEEDLE HYPO 21X1.5 SAFETY (NEEDLE) ×2 IMPLANT
NEEDLE SPNL 18GX3.5 QUINCKE PK (NEEDLE) ×2 IMPLANT
NS IRRIG 1000ML POUR BTL (IV SOLUTION) ×2 IMPLANT
PACK ARTHRO LIMB DRAPE STRL (MISCELLANEOUS) ×2 IMPLANT
PAD ABD 5X9 TENDERSORB (GAUZE/BANDAGES/DRESSINGS) ×2 IMPLANT
PAD ARMBOARD 7.5X6 YLW CONV (MISCELLANEOUS) ×2 IMPLANT
PADDING CAST COTTON 6X4 STRL (CAST SUPPLIES) ×2 IMPLANT
PADDING WEBRIL 6 STERILE (GAUZE/BANDAGES/DRESSINGS) ×2 IMPLANT
PROBE BIPOLAR ATHRO 135MM 90D (MISCELLANEOUS) ×2 IMPLANT
SET ARTHROSCOPY INST (INSTRUMENTS) ×2 IMPLANT
SET BASIN LINEN APH (SET/KITS/TRAYS/PACK) ×2 IMPLANT
SUT ETHILON 3 0 FSL (SUTURE) ×2 IMPLANT
SYR 10ML LL (SYRINGE) ×2 IMPLANT
SYR 30ML LL (SYRINGE) ×2 IMPLANT
TUBE CONNECTING 12X1/4 (SUCTIONS) ×4 IMPLANT
TUBING ARTHRO INFLOW-ONLY STRL (TUBING) ×2 IMPLANT

## 2018-01-15 NOTE — Anesthesia Preprocedure Evaluation (Signed)
Anesthesia Evaluation  Patient identified by MRN, date of birth, ID band Patient awake    Reviewed: Allergy & Precautions, H&P , NPO status , Patient's Chart, lab work & pertinent test results, reviewed documented beta blocker date and time   Airway Mallampati: III  TM Distance: >3 FB Neck ROM: full    Dental no notable dental hx.    Pulmonary neg pulmonary ROS,    Pulmonary exam normal breath sounds clear to auscultation       Cardiovascular Exercise Tolerance: Good negative cardio ROS   Rhythm:regular Rate:Normal     Neuro/Psych negative neurological ROS  negative psych ROS   GI/Hepatic negative GI ROS, Neg liver ROS,   Endo/Other  negative endocrine ROSdiabetes  Renal/GU negative Renal ROS  negative genitourinary   Musculoskeletal   Abdominal   Peds  Hematology negative hematology ROS (+)   Anesthesia Other Findings   Reproductive/Obstetrics negative OB ROS                             Anesthesia Physical Anesthesia Plan  ASA: II  Anesthesia Plan: General   Post-op Pain Management:    Induction:   PONV Risk Score and Plan:   Airway Management Planned:   Additional Equipment:   Intra-op Plan:   Post-operative Plan:   Informed Consent: I have reviewed the patients History and Physical, chart, labs and discussed the procedure including the risks, benefits and alternatives for the proposed anesthesia with the patient or authorized representative who has indicated his/her understanding and acceptance.   Dental Advisory Given  Plan Discussed with: CRNA  Anesthesia Plan Comments:         Anesthesia Quick Evaluation

## 2018-01-15 NOTE — Anesthesia Postprocedure Evaluation (Signed)
Anesthesia Post Note  Patient: Paul Barnes  Procedure(s) Performed: Chondroplasty medial  condyle  and trochlea left knee (Left Knee)  Patient location during evaluation: PACU Anesthesia Type: General Level of consciousness: awake and patient cooperative Pain management: pain level controlled Vital Signs Assessment: post-procedure vital signs reviewed and stable Respiratory status: spontaneous breathing, nonlabored ventilation and respiratory function stable Cardiovascular status: blood pressure returned to baseline Postop Assessment: no apparent nausea or vomiting Anesthetic complications: no     Last Vitals:  Vitals:   01/15/18 0725 01/15/18 0833  BP:  (P) 131/86  Pulse:    Resp: 20 (P) 20  Temp:  36.4 C  SpO2: 98% (P) 98%    Last Pain:  Vitals:   01/15/18 0833  TempSrc:   PainSc: (P) 0-No pain                 Adeline Petitfrere J

## 2018-01-15 NOTE — Discharge Instructions (Signed)
We performed a chondroplasty of your knee which is shaving of loose articular cartilage behind her kneecap and on the inside of your leg  We found arthritis in the knee 50% cartilage loss in the kneecap area and 75% cartilage loss on the medial part of the knee.  We did not find torn cartilage.  Start with meloxicam for pain at 500 mg of Tylenol as needed if no relief after taking these 2 medicines did not take the hydrocodone.  You could take the hydrocodone for 5 days and then we will use meloxicam Tylenol and/or ibuprofen for pain relief  Continue your exercises knee bends 253 times a day until seen by the doctor       General Anesthesia, Adult, Care After These instructions provide you with information about caring for yourself after your procedure. Your health care provider may also give you more specific instructions. Your treatment has been planned according to current medical practices, but problems sometimes occur. Call your health care provider if you have any problems or questions after your procedure. What can I expect after the procedure? After the procedure, it is common to have:  Vomiting.  A sore throat.  Mental slowness.  It is common to feel:  Nauseous.  Cold or shivery.  Sleepy.  Tired.  Sore or achy, even in parts of your body where you did not have surgery.  Follow these instructions at home: For at least 24 hours after the procedure:  Do not: ? Participate in activities where you could fall or become injured. ? Drive. ? Use heavy machinery. ? Drink alcohol. ? Take sleeping pills or medicines that cause drowsiness. ? Make important decisions or sign legal documents. ? Take care of children on your own.  Rest. Eating and drinking  If you vomit, drink water, juice, or soup when you can drink without vomiting.  Drink enough fluid to keep your urine clear or pale yellow.  Make sure you have little or no nausea before eating solid  foods.  Follow the diet recommended by your health care provider. General instructions  Have a responsible adult stay with you until you are awake and alert.  Return to your normal activities as told by your health care provider. Ask your health care provider what activities are safe for you.  Take over-the-counter and prescription medicines only as told by your health care provider.  If you smoke, do not smoke without supervision.  Keep all follow-up visits as told by your health care provider. This is important. Contact a health care provider if:  You continue to have nausea or vomiting at home, and medicines are not helpful.  You cannot drink fluids or start eating again.  You cannot urinate after 8-12 hours.  You develop a skin rash.  You have fever.  You have increasing redness at the site of your procedure. Get help right away if:  You have difficulty breathing.  You have chest pain.  You have unexpected bleeding.  You feel that you are having a life-threatening or urgent problem. This information is not intended to replace advice given to you by your health care provider. Make sure you discuss any questions you have with your health care provider. Document Released: 08/26/2000 Document Revised: 10/23/2015 Document Reviewed: 05/04/2015 Elsevier Interactive Patient Education  2018 [employer].     Knee Arthroscopy, Care After Refer to this sheet in the next few weeks. These instructions provide you with information about caring for yourself after your procedure.  Your health care provider may also give you more specific instructions. Your treatment has been planned according to current medical practices, but problems sometimes occur. Call your health care provider if you have any problems or questions after your procedure. What can I expect after the procedure? After the procedure, it is common to have:  Soreness.  Pain.  Follow these instructions at  home: Bathing  Do not take baths, swim, or use a hot tub until your health care provider approves. Incision care  There are many different ways to close and cover an incision, including stitches, skin glue, and adhesive strips. Follow your health care providers instructions about: ? Incision care. ? Bandage (dressing) changes and removal. ? Incision closure removal.  Check your incision area every day for signs of infection. Watch for: ? Redness, swelling, or pain. ? Fluid, blood, or pus. Activity  Avoid strenuous activities for as long as directed by your health care provider.  Return to your normal activities as directed by your health care provider. Ask your health care provider what activities are safe for you.  Perform range-of-motion exercises only as directed by your health care provider.  Do not lift anything that is heavier than 10 lb (4.5 kg).  Do not drive or operate heavy machinery while taking pain medicine.  If you were given crutches, use them as directed by your health care provider. Managing pain, stiffness, and swelling  If directed, apply ice to the injured area: ? Put ice in a plastic bag. ? Place a towel between your skin and the bag. ? Leave the ice on for 20 minutes, 2-3 times per day.  Raise the injured area above the level of your heart while you are sitting or lying down as directed by your health care provider. General instructions  Keep all follow-up visits as directed by your health care provider. This is important.  Take medicines only as directed by your health care provider.  Do not use any tobacco products, including cigarettes, chewing tobacco, or electronic cigarettes. If you need help quitting, ask your health care provider.  If you were given compression stockings, wear them as directed by your health care provider. These stockings help prevent blood clots and reduce swelling in your legs. Contact a health care provider if:  You have  severe pain with any movement of your knee.  You notice a bad smell coming from the incision or dressing.  You have redness, swelling, or pain at the site of your incision.  You have fluid, blood, or pus coming from your incision. Get help right away if:  You develop a rash.  You have a fever.  You have difficulty breathing or have shortness of breath.  You develop pain in your calves or in the back of your knee.  You develop chest pain.  You develop numbness or tingling in your leg or foot. This information is not intended to replace advice given to you by your health care provider. Make sure you discuss any questions you have with your health care provider. Document Released: 12/07/2004 Document Revised: 10/20/2015 Document Reviewed: 05/16/2014 Elsevier Interactive Patient Education  [employer].

## 2018-01-15 NOTE — Interval H&P Note (Signed)
History and Physical Interval Note:  01/15/2018 7:27 AM  Paul Barnes  has presented today for surgery, with the diagnosis of medial meniscus tear left knee  The various methods of treatment have been discussed with the patient and family. After consideration of risks, benefits and other options for treatment, the patient has consented to  Procedure(s): left knee arthroscopy with medial meniscectomy (Left) as a surgical intervention .  The patient's history has been reviewed, patient examined, no change in status, stable for surgery.  I have reviewed the patient's chart and labs.  Questions were answered to the patient's satisfaction.     Arther Abbott

## 2018-01-15 NOTE — Op Note (Signed)
01/15/2018  8:30 AM  PATIENT:  Paul Barnes  52 y.o. male  PRE-OPERATIVE DIAGNOSIS:  medial meniscus tear left knee  POST-OPERATIVE DIAGNOSIS:  arthritis left knee  PROCEDURE:  Procedure(s): Chondroplasty medial  condyle  and trochlea left knee (Left)   29877  Findings  Normal lateral compartment Normal ACL PCL notch Thinning of the medial meniscus without tear large diffuse chondral lesion medial femoral condyle entire weightbearing surface 50% cartilage loss thickness wise.  6 x 6 mm grade 4 lesion trochlea, mild grade 1 and 2 lesions medial lateral patellar facets  Details of surgery [name] was identified in the preop area his situation was reviewed his leg was reexamined.  His leg was cleaned the skin was normal surgery site was confirmed and marked.  Chart review was completed.  Patient was taken to surgery for general anesthesia with appropriate IV antibiotics started.  After successful general anesthesia leg was prepped and draped sterilely  After timeout standard lateral and medial portals were established and a diagnostic arthroscopy was performed with probing of intra-articular structures throughout the knee for a comprehensive examination.  I have listed the findings above.  Through the medial portal a 4.0 shaver was used to perform a chondroplasty which was further contoured with a 90 degree Linvatec wand.  This was continued on the trochlear region with a chondroplasty there as well.  The knee was thoroughly irrigated and the portals were closed with 3-0 interrupted nylon suture and the joint was injected with Marcaine and epinephrine.  The wounds were covered with sterile dressings Ace wrap Cryo/Cuff which was activated  After extubation the patient was taken to recovery room in stable condition   SURGEON:  Surgeon(s) and Role:    * Carole Civil, MD - Primary  PHYSICIAN ASSISTANT:   ASSISTANTS: none   ANESTHESIA:   general  EBL:  0 mL    BLOOD ADMINISTERED:none  DRAINS: none   LOCAL MEDICATIONS USED:  MARCAINE     SPECIMEN:  No Specimen  DISPOSITION OF SPECIMEN:  N/A  COUNTS:  YES  TOURNIQUET:  * Missing tourniquet times found for documented tourniquets in log: [id number] *  DICTATION: .Dragon Dictation  PLAN OF CARE: Discharge to home after PACU  PATIENT DISPOSITION:  PACU - hemodynamically stable.   Delay start of Pharmacological VTE agent (>24hrs) due to surgical blood loss or risk of bleeding: not applicable

## 2018-01-15 NOTE — Brief Op Note (Signed)
01/15/2018  8:30 AM  PATIENT:  Paul Barnes  52 y.o. male  PRE-OPERATIVE DIAGNOSIS:  medial meniscus tear left knee  POST-OPERATIVE DIAGNOSIS:  arthritis left knee  PROCEDURE:  Procedure(s): Chondroplasty medial  condyle  and trochlea left knee (Left)   29877  Findings  Normal lateral compartment Normal ACL PCL notch Thinning of the medial meniscus without tear large diffuse chondral lesion medial femoral condyle entire weightbearing surface 50% cartilage loss thickness wise.  6 x 6 mm grade 4 lesion trochlea, mild grade 1 and 2 lesions medial lateral patellar facets  Details of surgery [name] was identified in the preop area his situation was reviewed his leg was reexamined.  His leg was cleaned the skin was normal surgery site was confirmed and marked.  Chart review was completed.  Patient was taken to surgery for general anesthesia with appropriate IV antibiotics started.  After successful general anesthesia leg was prepped and draped sterilely  After timeout standard lateral and medial portals were established and a diagnostic arthroscopy was performed with probing of intra-articular structures throughout the knee for a comprehensive examination.  I have listed the findings above.  Through the medial portal a 4.0 shaver was used to perform a chondroplasty which was further contoured with a 90 degree Linvatec wand.  This was continued on the trochlear region with a chondroplasty there as well.  The knee was thoroughly irrigated and the portals were closed with 3-0 interrupted nylon suture and the joint was injected with Marcaine and epinephrine.  The wounds were covered with sterile dressings Ace wrap Cryo/Cuff which was activated  After extubation the patient was taken to recovery room in stable condition   SURGEON:  Surgeon(s) and Role:    * Carole Civil, MD - Primary  PHYSICIAN ASSISTANT:   ASSISTANTS: none   ANESTHESIA:   general  EBL:  0 mL    BLOOD ADMINISTERED:none  DRAINS: none   LOCAL MEDICATIONS USED:  MARCAINE     SPECIMEN:  No Specimen  DISPOSITION OF SPECIMEN:  N/A  COUNTS:  YES  TOURNIQUET:  * Missing tourniquet times found for documented tourniquets in log: [id number] *  DICTATION: .Dragon Dictation  PLAN OF CARE: Discharge to home after PACU  PATIENT DISPOSITION:  PACU - hemodynamically stable.   Delay start of Pharmacological VTE agent (>24hrs) due to surgical blood loss or risk of bleeding: not applicable

## 2018-01-15 NOTE — Anesthesia Procedure Notes (Signed)
Procedure Name: LMA Insertion Date/Time: 01/15/2018 7:41 AM Performed by: Charmaine Downs, CRNA Pre-anesthesia Checklist: Patient identified, Patient being monitored, Emergency Drugs available, Timeout performed and Suction available Patient Re-evaluated:Patient Re-evaluated prior to induction Oxygen Delivery Method: Circle System Utilized Preoxygenation: Pre-oxygenation with 100% oxygen Induction Type: IV induction Ventilation: Mask ventilation without difficulty LMA: LMA inserted LMA Size: 4.0 Grade View: Grade II Number of attempts: 1 Placement Confirmation: positive ETCO2 and breath sounds checked- equal and bilateral Tube secured with: Tape Dental Injury: Teeth and Oropharynx as per pre-operative assessment

## 2018-01-15 NOTE — Transfer of Care (Signed)
Immediate Anesthesia Transfer of Care Note  Patient: TARAN HAYNESWORTH  Procedure(s) Performed: Chondroplasty medial  condyle  and trochlea left knee (Left Knee)  Patient Location: PACU  Anesthesia Type:General  Level of Consciousness: awake and patient cooperative  Airway & Oxygen Therapy: Patient Spontanous Breathing and Patient connected to face mask oxygen  Post-op Assessment: Report given to RN, Post -op Vital signs reviewed and stable and Patient moving all extremities  Post vital signs: Reviewed and stable  Last Vitals:  Vitals Value Taken Time  BP    Temp    Pulse    Resp    SpO2      Last Pain:  Vitals:   01/15/18 0700  TempSrc: Oral  PainSc: 0-No pain      Patients Stated Pain Goal: 7 (84/78/41 2820)  Complications: No apparent anesthesia complications

## 2018-01-16 ENCOUNTER — Encounter (HOSPITAL_COMMUNITY): Payer: Self-pay | Admitting: Orthopedic Surgery

## 2018-01-22 DIAGNOSIS — Z9889 Other specified postprocedural states: Secondary | ICD-10-CM | POA: Insufficient documentation

## 2018-01-23 ENCOUNTER — Encounter: Payer: Self-pay | Admitting: Orthopedic Surgery

## 2018-01-23 ENCOUNTER — Ambulatory Visit (INDEPENDENT_AMBULATORY_CARE_PROVIDER_SITE_OTHER): Payer: BLUE CROSS/BLUE SHIELD | Admitting: Orthopedic Surgery

## 2018-01-23 DIAGNOSIS — Z9889 Other specified postprocedural states: Secondary | ICD-10-CM

## 2018-01-23 NOTE — Progress Notes (Signed)
Chief Complaint  Patient presents with  . Follow-up    Recheck on left knee, DOS 01-15-18.    Postop arthroscopy left knee postop day 8 patient's primary complaint is popping  At the time of surgery his meniscus was normal he had a chondral lesion of the trochlea and one on the medial femoral condyle  He is not requiring any opioid pain medication at this time he is ambulatory with a crutch  His portals were clean we remove the sutures  He is advised to continue weightbearing as tolerated with active range of motion exercises, ice and stay out of work for 3 weeks until the next office visit in 3 weeks  Encounter Diagnosis  Name Primary?  . S/P left knee arthroscopy 01/15/18

## 2018-01-23 NOTE — Patient Instructions (Signed)
Please send [name] copy of the postop note surgical dictation and today's note  Patient  Continue range of motion exercises ice  Return in 3 weeks out of work 3 additional weeks from today

## 2018-01-26 ENCOUNTER — Other Ambulatory Visit: Payer: Self-pay | Admitting: Family Medicine

## 2018-01-30 ENCOUNTER — Other Ambulatory Visit: Payer: Self-pay | Admitting: Family Medicine

## 2018-02-16 ENCOUNTER — Encounter: Payer: Self-pay | Admitting: Orthopedic Surgery

## 2018-02-16 ENCOUNTER — Ambulatory Visit (INDEPENDENT_AMBULATORY_CARE_PROVIDER_SITE_OTHER): Payer: BLUE CROSS/BLUE SHIELD | Admitting: Orthopedic Surgery

## 2018-02-16 VITALS — BP 138/86 | HR 81 | Ht 74.0 in | Wt 254.0 lb

## 2018-02-16 DIAGNOSIS — Z9889 Other specified postprocedural states: Secondary | ICD-10-CM

## 2018-02-16 NOTE — Patient Instructions (Signed)
oow 4 weeks

## 2018-02-16 NOTE — Progress Notes (Signed)
Chief Complaint  Patient presents with  . Knee Pain    Encounter Diagnosis  Name Primary?  . S/P left knee arthroscopy 01/15/18  Yes    52 year old male had a left knee arthroscopy  He has regained most of his motion but he still has pressure and pain when he is trying to kneel and he does not have adequate strength with single leg stairclimbing  His knee looks very good he has no swelling he has some tenderness around the patella his flexion arc is normal his quadricep strength seems to be good to manual muscle testing  Recommend continue strengthening exercises  Out of work 4 weeks reassess in 4 weeks when he can go back to work  Arthroscopy summary details below 01/15/2018  8:30 AM  PATIENT:  Paul Barnes  52 y.o. male  PRE-OPERATIVE DIAGNOSIS:  medial meniscus tear left knee  POST-OPERATIVE DIAGNOSIS:  arthritis left knee  PROCEDURE:  Procedure(s): Chondroplasty medial  condyle  and trochlea left knee (Left)   29877  Findings  Normal lateral compartment Normal ACL PCL notch Thinning of the medial meniscus without tear large diffuse chondral lesion medial femoral condyle entire weightbearing surface 50% cartilage loss thickness wise.  6 x 6 mm grade 4 lesion trochlea, mild grade 1 and 2 lesions medial lateral patellar facets

## 2018-03-01 ENCOUNTER — Other Ambulatory Visit: Payer: Self-pay | Admitting: Family Medicine

## 2018-03-03 ENCOUNTER — Other Ambulatory Visit: Payer: Self-pay | Admitting: *Deleted

## 2018-03-03 ENCOUNTER — Other Ambulatory Visit: Payer: Self-pay | Admitting: Family Medicine

## 2018-03-03 MED ORDER — GLIPIZIDE 5 MG PO TABS: ORAL_TABLET | ORAL | 0 refills | Status: DC

## 2018-03-16 ENCOUNTER — Ambulatory Visit (INDEPENDENT_AMBULATORY_CARE_PROVIDER_SITE_OTHER): Payer: BLUE CROSS/BLUE SHIELD | Admitting: Orthopedic Surgery

## 2018-03-16 ENCOUNTER — Encounter: Payer: Self-pay | Admitting: Orthopedic Surgery

## 2018-03-16 VITALS — BP 128/87 | HR 79 | Ht 74.0 in | Wt 254.0 lb

## 2018-03-16 DIAGNOSIS — Z9889 Other specified postprocedural states: Secondary | ICD-10-CM

## 2018-03-16 DIAGNOSIS — M1712 Unilateral primary osteoarthritis, left knee: Secondary | ICD-10-CM

## 2018-03-16 MED ORDER — DICLOFENAC SODIUM 75 MG PO TBEC: 75.0000 mg | DELAYED_RELEASE_TABLET | Freq: Two times a day (BID) | ORAL | 0 refills | Status: DC

## 2018-03-16 NOTE — Progress Notes (Addendum)
Chief Complaint  Patient presents with  . Routine Post Op    01/15/18 knee arthroscopy improving but has some aching and weakness    Encounter Diagnosis  Name Primary?  . S/P left knee arthroscopy 01/15/18  Yes   36 male had left knee arthroscopy 52 year old male had a left knee arthroscopy We primarily found arthritis medial femoral condyle trochlea  Still has weakness going up and down the steps has less crepitance but persistent crepitance in his knee and aching pain starting in his knee running down the shin  We stopped his meloxicam because the ibuprofen was doing better however it is not providing complete relief   PRE-OPERATIVE DIAGNOSIS:  medial meniscus tear left knee  POST-OPERATIVE DIAGNOSIS:  arthritis left knee  PROCEDURE:  Procedure(s): Chondroplasty medial  condyle  and trochlea left knee (Left)   29877  Findings  Normal lateral compartment Normal ACL PCL notch Thinning of the medial meniscus without tear large diffuse chondral lesion medial femoral condyle entire weightbearing surface 50% cartilage loss thickness wise.  6 x 6 mm grade 4 lesion trochlea, mild grade 1 and 2 lesions medial lateral patellar facets   So this point we are going to try diclofenac for a month to call us if it is working good we will continue to is an ongoing prescription if not then we can switch to perhaps Relafen.  He should have an x-ray of his knee in February  HE CAN RETURN TO WORK WHEN HE FEELS UP TO IT   Meds ordered this encounter  Medications  . diclofenac (VOLTAREN) 75 MG EC tablet    Sig: Take 1 tablet (75 mg total) by mouth 2 (two) times daily with a meal.    Dispense:  60 tablet    Refill:  0

## 2018-04-13 ENCOUNTER — Encounter: Payer: Self-pay | Admitting: Orthopedic Surgery

## 2018-04-13 ENCOUNTER — Ambulatory Visit (INDEPENDENT_AMBULATORY_CARE_PROVIDER_SITE_OTHER): Payer: BLUE CROSS/BLUE SHIELD | Admitting: Orthopedic Surgery

## 2018-04-13 VITALS — BP 133/92 | HR 79 | Ht 74.0 in | Wt 250.0 lb

## 2018-04-13 DIAGNOSIS — M1712 Unilateral primary osteoarthritis, left knee: Secondary | ICD-10-CM

## 2018-04-13 DIAGNOSIS — Z9889 Other specified postprocedural states: Secondary | ICD-10-CM

## 2018-04-13 MED ORDER — DICLOFENAC SODIUM 75 MG PO TBEC: 75.0000 mg | DELAYED_RELEASE_TABLET | Freq: Two times a day (BID) | ORAL | 5 refills | Status: DC

## 2018-04-13 NOTE — Patient Instructions (Signed)
OOW CONTINUE

## 2018-04-13 NOTE — Progress Notes (Signed)
Chief Complaint  Patient presents with  . Knee Pain    left/ improving some s/p arthroscopy 01/15/18   Approximately 3 months after arthroscopy left knee operative findings included the following: Thinning of the medial meniscus without tear large diffuse chondral lesion medial femoral condyle entire weightbearing surface 50% cartilage loss 6 x 6 grade 4 trochlea lesion mild grade 1 2 lesions medial and lateral patellar facets  His complaints are: Pain going up and down the stairs still cannot bend his knee all the way has small amount of pain when he twists his knee  He did get some relief from the constant aching with diclofenac  Side effects from the diclofenac included some gastric upset.  Encouraged to take the medication with food which he was not doing  Physical Exam  Musculoskeletal:       Left knee: He exhibits decreased range of motion. He exhibits no swelling, no effusion, no deformity, no laceration, no erythema, normal alignment, no LCL laxity, normal meniscus and no MCL laxity. Tenderness found. Medial joint line tenderness noted.   Meds ordered this encounter  Medications  . diclofenac (VOLTAREN) 75 MG EC tablet    Sig: Take 1 tablet (75 mg total) by mouth 2 (two) times daily with a meal.    Dispense:  60 tablet    Refill:  5    The patient is still out of work.  He cannot squat kneel climb excessive bend or go up and down steps  Follow-up with me in 2 months continue diclofenac.  On next visit he will check the side effects related to the medicine check the effect of the medicine check his range of motion and strength in his left knee

## 2018-04-21 ENCOUNTER — Telehealth: Payer: Self-pay | Admitting: Orthopedic Surgery

## 2018-04-21 NOTE — Telephone Encounter (Signed)
Patient called, states his [employer] disability forms were "not accepted" - states a return to work date is needed, as well as whether patient can return with restrictions. Said he is glad to schedule appointment if Dr Aline Brochure prefers.  Copies of forms faxed on 04/1518 are in Dr's box, as well as newly faxed form from [place]. Please advise.

## 2018-04-22 NOTE — Telephone Encounter (Signed)
Called back to patient, reached voice mail, left message to return call.

## 2018-04-22 NOTE — Telephone Encounter (Signed)
Return to work with restrictions jan 2021  No new appt just to talk   ???

## 2018-04-23 NOTE — Telephone Encounter (Signed)
On 04/22/18, faxed patient's forms, completed by Dr Aline Brochure, to [facility] per request, [phone].

## 2018-04-28 ENCOUNTER — Ambulatory Visit (INDEPENDENT_AMBULATORY_CARE_PROVIDER_SITE_OTHER): Payer: BLUE CROSS/BLUE SHIELD | Admitting: Family Medicine

## 2018-04-28 ENCOUNTER — Encounter: Payer: Self-pay | Admitting: Family Medicine

## 2018-04-28 VITALS — BP 128/80 | Ht 74.0 in | Wt 252.0 lb

## 2018-04-28 DIAGNOSIS — E119 Type 2 diabetes mellitus without complications: Secondary | ICD-10-CM

## 2018-04-28 DIAGNOSIS — Z125 Encounter for screening for malignant neoplasm of prostate: Secondary | ICD-10-CM | POA: Diagnosis not present

## 2018-04-28 DIAGNOSIS — Z79899 Other long term (current) drug therapy: Secondary | ICD-10-CM

## 2018-04-28 DIAGNOSIS — E785 Hyperlipidemia, unspecified: Secondary | ICD-10-CM

## 2018-04-28 LAB — POCT GLYCOSYLATED HEMOGLOBIN (HGB A1C): HEMOGLOBIN A1C: 6.3 % — AB (ref 4.0–5.6)

## 2018-04-28 MED ORDER — METFORMIN HCL 500 MG PO TABS: ORAL_TABLET | ORAL | 1 refills | Status: DC

## 2018-04-28 MED ORDER — SILDENAFIL CITRATE 100 MG PO TABS: ORAL_TABLET | ORAL | 11 refills | Status: DC

## 2018-04-28 MED ORDER — GLIPIZIDE 5 MG PO TABS: ORAL_TABLET | ORAL | 0 refills | Status: DC

## 2018-04-28 MED ORDER — GLUCOSE BLOOD VI STRP: ORAL_STRIP | 5 refills | Status: DC

## 2018-04-28 MED ORDER — ALBUTEROL SULFATE HFA 108 (90 BASE) MCG/ACT IN AERS: 2.0000 | INHALATION_SPRAY | Freq: Four times a day (QID) | RESPIRATORY_TRACT | 2 refills | Status: DC | PRN

## 2018-04-28 MED ORDER — FLUTICASONE PROPIONATE 50 MCG/ACT NA SUSP: NASAL | 5 refills | Status: DC

## 2018-04-28 NOTE — Progress Notes (Signed)
   Subjective:    Patient ID: Paul Barnes, male    DOB: [birth year], 52 y.o.   MRN: [id number] Patient presents with numerous concerns HPI Patient is here today to follow up on his chronic illnesses.He is diabetic. He is taking Metformin 500 two po bid and glipizide 5 mg one bid. He eats healthy and gets some exercise. He does not see eye Dr. Regularly. He does see Arther Abbott for his knee..   Review of Systems Results for orders placed or performed in visit on 04/28/18  POCT glycosylated hemoglobin (Hb A1C)  Result Value Ref Range   Hemoglobin A1C 6.3 (A) 4.0 - 5.6 %   HbA1c POC (<> result, manual entry)     HbA1c, POC (prediabetic range)     HbA1c, POC (controlled diabetic range)     Patient claims compliance with diabetes medication. No obvious side effects. Reports no substantial low sugar spells. Most numbers are generally in good range when checked fasting. Generally does not miss a dose of medication. Watching diabetic diet closely  Glu s ovrall decetn   o eye doc visit lately  Did dot physical   Working busy and overtime   Every day walking and staying actie   No sig low spells   Exercise overall pretty good seeing improvement in th numbers  No headache, no major weight loss or weight gain, no chest pain no back pain abdominal pain no change in bowel habits complete ROS otherwise negative     Objective:   Physical Exam  Alert vitals stable, NAD. Blood pressure good on repeat. HEENT normal. Lungs clear. Heart regular rate and rhythm.       Assessment & Plan:  Impression type 2 diabetes.  A1c good discussed.  Patient to maintain same intervention diet exercise discussed.  Follow-up in 6 months for chronic plus wellness.  Appropriate blood work further recommendations based on results

## 2018-05-25 ENCOUNTER — Encounter: Payer: Self-pay | Admitting: Family Medicine

## 2018-05-25 ENCOUNTER — Ambulatory Visit (INDEPENDENT_AMBULATORY_CARE_PROVIDER_SITE_OTHER): Payer: BLUE CROSS/BLUE SHIELD | Admitting: Family Medicine

## 2018-05-25 DIAGNOSIS — F909 Attention-deficit hyperactivity disorder, unspecified type: Secondary | ICD-10-CM

## 2018-05-25 MED ORDER — METHYLPHENIDATE HCL ER (OSM) 36 MG PO TBCR: EXTENDED_RELEASE_TABLET | ORAL | 0 refills | Status: DC

## 2018-05-25 MED ORDER — METHYLPHENIDATE HCL ER (OSM) 27 MG PO TBCR: EXTENDED_RELEASE_TABLET | ORAL | 0 refills | Status: DC

## 2018-05-25 NOTE — Patient Instructions (Signed)
Attention Deficit Hyperactivity Disorder, Adult Attention deficit hyperactivity disorder (ADHD) is a mental health disorder that starts during childhood. For many people with ADHD, the disorder continues into adult years. There are many things that you and your health care provider or therapist (mental health professional) can do to manage your symptoms. What are the causes? The exact cause of ADHD is not known. What increases the risk? You are more likely to develop this condition if:  You have a family history of ADHD.  You are male.  You were born to a mother who smoked or drank alcohol during pregnancy.  You were exposed to lead poisoning or other toxins in the womb or in early life.  You were born before 37 weeks of pregnancy (prematurely) or you had a low birth weight.  You have experienced a brain injury. What are the signs or symptoms? Symptoms of this condition depend on the type of ADHD. The two main types are inattentive and hyperactive-impulsive. Some people may have symptoms of both types. Symptoms of the inattentive type include:  Difficulty watching, listening, or thinking with focused effort (paying attention).  Making careless mistakes.  Not listening.  Not following instructions.  Being disorganized.  Avoiding tasks that require time and attention.  Losing things.  Forgetting things.  Being easily distracted. Symptoms of the hyperactive-impulsive type include:  Restlessness.  Talking too much.  Interrupting.  Difficulty with: ? Sitting still. ? Staying quiet. ? Feeling motivated. ? Relaxing. ? Waiting in line or waiting for a turn. How is this diagnosed? This condition is diagnosed based on your current symptoms and your history of symptoms. The diagnosis can be made by a provider such as a primary care provider, psychiatrist, psychologist, or clinical social worker. The provider may use a symptom checklist or a standardized behavior rating  scale to evaluate your symptoms. He or she may want to talk with family members who have known you for a long time and have observed your behaviors. There are no lab tests or brain imaging tests that can diagnose ADHD. How is this treated? This condition can be treated with medicines and behavior therapy. Medicines may be the best option to reduce impulsive behaviors and improve attention. Your health care provider may recommend:  Stimulant medicines. These are the most common medicines used for adult ADHD. They affect certain chemicals in the brain (neurotransmitters). These medicines may be long-acting or short-acting. This will determine how often you need to take the medicine.  A non-stimulant medicine for adult ADHD (atomoxetine). This medicine increases a neurotransmitter called norepinephrine. It may take weeks to months to see effects from this medicine. Psychotherapy and behavioral management are also important for treating ADHD. Psychotherapy is often used along with medicine. Your health care provider may suggest:  Cognitive behavioral therapy (CBT). This type of therapy teaches you to replace negative thoughts and actions with positive thoughts and actions. When used as part of ADHD treatment, this therapy may also include: ? Coping strategies for organization, time management, impulse control, and stress reduction. ? Mindfulness and meditation training.  Behavioral management. This may include strategies for organization and time management. You may work with an ADHD coach who is specially trained to help people with ADHD to manage and organize activities and to function more effectively. Follow these instructions at home: Medicines   Take over-the-counter and prescription medicines only as told by your health care provider.  Talk with your health care provider about the possible side effects of your   medicine to watch for. General instructions   Learn as much as you can about  adult ADHD, and work closely with your health care providers to find the treatments that work best for you.  Do not use drugs or abuse alcohol. Limit alcohol intake to no more than 1 drink a day for nonpregnant women and 2 drinks a day for men. One drink equals 12 oz of beer, 5 oz of wine, or 1 oz of hard liquor.  Follow the same schedule each day. Make sure your schedule includes enough time for you to get plenty of sleep.  Use reminder devices like notes, calendars, and phone apps to stay on-time and organized.  Eat a healthy diet. Do not skip meals.  Exercise regularly. Exercise can help to reduce stress and anxiety.  Keep all follow-up visits as told by your health care provider and therapist. This is important. Where to find more information  A health care provider may be able to recommend resources that are available online or over the phone. You could start with: ? Attention Deficit Disorder Association (ADDA): www.add.org ? National Institute of Mental Health (NIMH): www.nimh.nih.gov Contact a health care provider if:  Your symptoms are changing, getting worse, or not improving.  You have side effects from your medicine, such as: ? Repeated muscle twitches, coughing, or speech outbursts. ? Sleep problems. ? Loss of appetite. ? Depression. ? New or worsening behavior problems. ? Dizziness. ? Unusually fast heartbeat. ? Stomach pains. ? Headaches.  You are struggling with anxiety, depression, or substance abuse. Get help right away if:  You have a severe reaction to a medicine.  You have thoughts of hurting yourself or others. If you ever feel like you may hurt yourself or others, or have thoughts about taking your own life, get help right away. You can go to the nearest emergency department or call:  Your local emergency services (911 in the [place]  A suicide crisis helpline, such as the National Suicide Prevention Lifeline at 1-800-273-8255. This is open 24 hours a  day. Summary  ADHD is a mental health disorder that starts during childhood and often continues into adult years.  The exact cause of ADHD is not known.  There is no cure for ADHD, but treatment with medicine, therapy, or behavioral training can help you manage your condition. This information is not intended to replace advice given to you by your health care provider. Make sure you discuss any questions you have with your health care provider. Document Released: 01/09/2017 Document Revised: 01/09/2017 Document Reviewed: 01/09/2017 Elsevier Interactive Patient Education  2019 Elsevier Inc.  
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
## 2018-06-10 ENCOUNTER — Ambulatory Visit (INDEPENDENT_AMBULATORY_CARE_PROVIDER_SITE_OTHER): Payer: BLUE CROSS/BLUE SHIELD | Admitting: Family Medicine

## 2018-06-10 ENCOUNTER — Encounter: Payer: Self-pay | Admitting: Family Medicine

## 2018-06-10 VITALS — BP 132/90 | Temp 98.1°F | Wt 252.2 lb

## 2018-06-10 DIAGNOSIS — J329 Chronic sinusitis, unspecified: Secondary | ICD-10-CM

## 2018-06-10 DIAGNOSIS — J4521 Mild intermittent asthma with (acute) exacerbation: Secondary | ICD-10-CM

## 2018-06-10 MED ORDER — DOXYCYCLINE HYCLATE 100 MG PO TABS: ORAL_TABLET | ORAL | 0 refills | Status: DC

## 2018-06-10 NOTE — Progress Notes (Signed)
   Subjective:    Patient ID: Paul Barnes, male    DOB: 1966-05-18, 53 y.o.   MRN: [id number]  Sore Throat   This is a new problem. The current episode started in the past 7 days. Associated symptoms include coughing and headaches. Associated symptoms comments: Stuffy head, dizziness, congested head, ears popping, very thirsty. He has tried NSAIDs (Alka seltzer cold plus, vit c, nasal spray, albuterol inhaler) for the symptoms. The treatment provided mild relief.    Frontal headache, worse with cough  sjarp in nature  sockid in not much discharge    Pt has cough with it   Cough mostly  Up top not in the chest    No fever   Cough pretty rough at night''  Review of Systems  Respiratory: Positive for cough.   Neurological: Positive for headaches.       Objective:   Physical Exam  Alert, mild malaise. Hydration good Vitals stable. frontal/ maxillary tenderness evident positive nasal congestion. pharynx normal neck supple  lungs clear/no crackles or wheezes. heart regular in rhythm      Assessment & Plan:  Impression rhinosinusitis likely post viral, discussed with patient. plan antibiotics prescribed. Questions answered. Symptomatic care discussed. warning signs discussed.  Patient also has a wheezy cough with history of reactive airways in the past so encouraged to use albuterol regularly until cough WSL

## 2018-06-15 ENCOUNTER — Ambulatory Visit (INDEPENDENT_AMBULATORY_CARE_PROVIDER_SITE_OTHER): Payer: BLUE CROSS/BLUE SHIELD | Admitting: Orthopedic Surgery

## 2018-06-15 ENCOUNTER — Encounter: Payer: Self-pay | Admitting: Orthopedic Surgery

## 2018-06-15 VITALS — BP 125/89 | HR 76 | Ht 74.0 in | Wt 250.0 lb

## 2018-06-15 DIAGNOSIS — M1712 Unilateral primary osteoarthritis, left knee: Secondary | ICD-10-CM | POA: Diagnosis not present

## 2018-06-15 DIAGNOSIS — Z9889 Other specified postprocedural states: Secondary | ICD-10-CM

## 2018-06-15 NOTE — Patient Instructions (Signed)
Continue work restrictions

## 2018-06-15 NOTE — Progress Notes (Signed)
Progress Note   Patient ID: Paul Barnes, male   DOB: 06-08-1965, 53 y.o.   MRN: [id number]   Chief Complaint  Patient presents with  . Routine Post Op    Left knee DOS 01/15/18    53 year old male currently on work restrictions regarding his left knee after we found arthritis on his arthroscopy on August 15 of 2019  His work restrictions will eventually become permanent.  He now complains of pain and inability to kneel squat or crawl  He is on 75 mg of diclofenac notices that if he does not take it the knee feels much worse and he has much more stiffness 01/15/2018  8:30 AM  PATIENT:  Paul Barnes  53 y.o. male  PRE-OPERATIVE DIAGNOSIS:  medial meniscus tear left knee  POST-OPERATIVE DIAGNOSIS:  arthritis left knee  PROCEDURE:  Procedure(s): Chondroplasty medial  condyle  and trochlea left knee (Left)   29877  Findings  Normal lateral compartment Normal ACL PCL notch Thinning of the medial meniscus without tear large diffuse chondral lesion medial femoral condyle entire weightbearing surface 50% cartilage loss thickness wise.  6 x 6 mm grade 4 lesion trochlea, mild grade 1 and 2 lesions medial lateral patellar facets    Review of Systems  Respiratory: Positive for cough.   Neurological: Negative for tingling.     No Known Allergies   BP 125/89   Pulse 76   Ht 6\' 2"  (1.88 m)   Wt 250 lb (113.4 kg)   BMI 32.10 kg/m   Physical Exam Vitals signs reviewed.  Constitutional:      Appearance: He is well-developed.     Comments: Vital signs have been reviewed and are stable. Gen. appearance the patient is well-developed and well-nourished with normal grooming and hygiene.   Skin:    General: Skin is warm and dry.     Findings: No erythema.  Neurological:     Mental Status: He is alert and oriented to person, place, and time.     Gait: Gait abnormal.   Knee no effusion today he has medial tenderness moderate lateral tenderness mild he has full  extension with 120 degrees of flexion and weakness to manual muscle testing in his quadriceps with a stable knee   Medical decisions:   Data  Imaging:   No new images  Encounter Diagnoses  Name Primary?  . S/P left knee arthroscopy 01/15/18    . Primary osteoarthritis of left knee Yes    PLAN:   Recommend continue diclofenac 75 mg twice a day Continue knee strengthening exercises to improve quadriceps function Continue work status as is with no work allowed Return 1 month    Arther Abbott, MD 06/15/2018 9:42 AM

## 2018-06-17 ENCOUNTER — Telehealth: Payer: Self-pay | Admitting: Orthopedic Surgery

## 2018-06-17 NOTE — Telephone Encounter (Signed)
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 Is he completely out of work? Can he return with restrictions, as said "same restrictions" mentioned. Chart note and work note issued indicate "remain out of work"; chart note reads: Continue work status as is with no work allowed

## 2018-06-18 NOTE — Telephone Encounter (Signed)
Call him and ask him what he wants to do

## 2018-06-18 NOTE — Telephone Encounter (Signed)
Called patient; discussed. States continue out of work as discussed at office visit 06/17/18, and re-discuss at next visit 07/17/18. Done.

## 2018-07-15 ENCOUNTER — Ambulatory Visit (INDEPENDENT_AMBULATORY_CARE_PROVIDER_SITE_OTHER): Payer: BLUE CROSS/BLUE SHIELD | Admitting: Orthopedic Surgery

## 2018-07-15 ENCOUNTER — Encounter: Payer: Self-pay | Admitting: Orthopedic Surgery

## 2018-07-15 VITALS — BP 130/87 | HR 77 | Ht 76.0 in

## 2018-07-15 DIAGNOSIS — Z9889 Other specified postprocedural states: Secondary | ICD-10-CM | POA: Diagnosis not present

## 2018-07-15 NOTE — Patient Instructions (Signed)
FU 1 MONTH

## 2018-07-15 NOTE — Progress Notes (Signed)
Chief Complaint  Patient presents with  . Routine Post Op    left knee scope 01/15/18     53 YO MALE WITH left knee arthroscopy he had thinning of the medial meniscus but no tear he had a diffuse chondral lesion medial femoral condyle 50% cartilage thickness loss over the entire weightbearing surface had a 6 x 6 grade 4 lesion on the trochlea and then medial and lateral patellar facet grade 1 and 2 lesions respectively  He is on diclofenac which controls his pain fairly well but if he stops taking it he immediately notices a difference  He is still tender over the medial joint line has patellofemoral crepitance and pain with patella compression This is inhibiting his quadriceps causing quadriceps weakness  I will see him in a month he is out of work until April 13

## 2018-07-17 ENCOUNTER — Ambulatory Visit: Payer: BLUE CROSS/BLUE SHIELD | Admitting: Orthopedic Surgery

## 2018-07-21 ENCOUNTER — Other Ambulatory Visit: Payer: Self-pay | Admitting: Family Medicine

## 2018-07-21 NOTE — Telephone Encounter (Signed)
Ok enough ref to last til next visit

## 2018-08-05 ENCOUNTER — Other Ambulatory Visit: Payer: Self-pay | Admitting: Family Medicine

## 2018-08-12 ENCOUNTER — Other Ambulatory Visit: Payer: Self-pay

## 2018-08-12 ENCOUNTER — Ambulatory Visit (INDEPENDENT_AMBULATORY_CARE_PROVIDER_SITE_OTHER): Payer: BLUE CROSS/BLUE SHIELD | Admitting: Orthopedic Surgery

## 2018-08-12 ENCOUNTER — Encounter: Payer: Self-pay | Admitting: Orthopedic Surgery

## 2018-08-12 VITALS — BP 145/86 | HR 78 | Ht 74.0 in | Wt 250.0 lb

## 2018-08-12 DIAGNOSIS — Z9889 Other specified postprocedural states: Secondary | ICD-10-CM

## 2018-08-12 DIAGNOSIS — M6752 Plica syndrome, left knee: Secondary | ICD-10-CM | POA: Diagnosis not present

## 2018-08-12 NOTE — Progress Notes (Signed)
Chief Complaint  Patient presents with  . Knee Pain    left     53 year old male status post left knee arthroscopy.  Patient has residual osteoarthritis symptoms.  No major change in symptoms currently on diclofenac  Complains of clicking popping and pain and stiffness in his left knee especially after sitting  Review of Systems  Constitutional: Negative for fever.  Neurological: Negative for tingling.   BP (!) 145/86   Pulse 78   Ht 6\' 2"  (1.88 m)   Wt 250 lb (113.4 kg)   BMI 32.10 kg/m  Physical Exam Vitals signs reviewed.  Constitutional:      Appearance: Normal appearance. He is well-developed.  Musculoskeletal:       Legs:  Skin:    General: Skin is warm and dry.     Findings: No erythema.  Neurological:     Mental Status: He is alert and oriented to person, place, and time.     Comments: He has a limp favoring his left leg  Psychiatric:        Mood and Affect: Mood and affect normal.    Encounter Diagnoses  Name Primary?  . S/P left knee arthroscopy 01/15/18    . Synovial plica of knee, left Yes    Injection of medial synovial plica  Injection was done with Depo-Medrol 40 mg 2 cc 1% lidocaine  Tolerated without complication patient gave verbal consent Timeout was taken to confirm injection site.

## 2018-08-27 ENCOUNTER — Other Ambulatory Visit: Payer: Self-pay | Admitting: Family Medicine

## 2018-08-27 ENCOUNTER — Telehealth: Payer: Self-pay | Admitting: Family Medicine

## 2018-08-27 MED ORDER — METHYLPHENIDATE HCL ER (OSM) 36 MG PO TBCR: EXTENDED_RELEASE_TABLET | ORAL | 0 refills | Status: DC

## 2018-08-27 NOTE — Telephone Encounter (Signed)
Requesting refill for methylphenidate (CONCERTA) 36 MG PO CR tablet    Pharmacy:  [facility] [place], [place] - [address] AT [address]

## 2018-08-27 NOTE — Telephone Encounter (Signed)
I called and left a message to r/c. 

## 2018-08-27 NOTE — Telephone Encounter (Signed)
Pt returned call and verbalized understanding  

## 2018-08-27 NOTE — Telephone Encounter (Signed)
Please advise. Thank you

## 2018-08-27 NOTE — Telephone Encounter (Signed)
Medication was sent in as requested please keep follow-up as planned

## 2018-09-14 ENCOUNTER — Encounter: Payer: Self-pay | Admitting: Orthopedic Surgery

## 2018-09-14 ENCOUNTER — Ambulatory Visit (INDEPENDENT_AMBULATORY_CARE_PROVIDER_SITE_OTHER): Payer: BLUE CROSS/BLUE SHIELD | Admitting: Orthopedic Surgery

## 2018-09-14 ENCOUNTER — Other Ambulatory Visit: Payer: Self-pay

## 2018-09-14 DIAGNOSIS — M6752 Plica syndrome, left knee: Secondary | ICD-10-CM

## 2018-09-14 DIAGNOSIS — M1712 Unilateral primary osteoarthritis, left knee: Secondary | ICD-10-CM

## 2018-09-14 DIAGNOSIS — Z9889 Other specified postprocedural states: Secondary | ICD-10-CM

## 2018-09-14 NOTE — Progress Notes (Addendum)
I connected with [name] on 09/14/18 at  9:30 AM EDT by telephone and verified that I am speaking with the correct person using two identifiers.   I discussed the limitations, risks, security and privacy concerns of performing an evaluation and management service by telephone and the availability of in person appointments. I also discussed with the patient that there may be a patient responsible charge related to this service. The patient expressed understanding and agreed to proceed.   Progress Note   Patient ID: Paul Barnes, male   DOB: Oct 26, 1965, 53 y.o.   MRN: [id number]   No chief complaint on file.   53 YO male status post arthroscopy left knee has osteoarthritis is currently on short-term disability approved through March 31 last visit was March 11  He did receive an injection for plica syndrome.  He says that did not help  He still on his diclofenac 75 mg twice a day  He complains of occasional catching sensation when turning    Review of Systems  Musculoskeletal: Positive for back pain.       Twisted his knee feels he may have injured his back     No Known Allergies   There were no vitals taken for this visit.  Physical Exam   Medical decisions:   Data  Encounter Diagnoses  Name Primary?  . S/P left knee arthroscopy 01/15/18    . Synovial plica of knee, left   . Primary osteoarthritis of left knee Yes    PLAN:   SHOT DIDN'T HELP!  STD APPROVED TO MARCH 31 ST   RELEASE DATE WITH PERMANENT RESTRICTIONS:  NEXT LETTER  PENDING: RESTRICTIONS INFO  Arther Abbott, MD 09/14/2018 9:47 AM  Follow Up Instructions:    I discussed the assessment and treatment plan with the patient. The patient was provided an opportunity to ask questions and all were answered. The patient agreed with the plan and demonstrated an understanding of the instructions.   The patient was advised to call back or seek an in-person evaluation if the symptoms  worsen or if the condition fails to improve as anticipated.  I provided 7 minutes of non-face-to-face time during this encounter.  Addendum dated April 21 10 AM  Permanent restrictions  1.  Patient cannot climb poles of a ladder no climbing  2.  Cannot put weight directly on knee cannot crawl or kneel  3.  Cannot lift over 20 pounds  Cannot climb stairs  Cannot sit without a break for more than 1 hour.  Each hour would need 15-minute break  Cannot stand longer than 1 hour without 15-minute break

## 2018-09-16 ENCOUNTER — Telehealth: Payer: Self-pay | Admitting: Radiology

## 2018-09-16 NOTE — Telephone Encounter (Signed)
Patient called, left message saying that he could not get his email to come through.  He left his email, and said if we'd email him he could respond with the letter.  He came up here today after we were closed he says.  I tried to email him at [email or web address] so he could send it, and my email failed to go through to him.  He says he was going to come up here tomorrow morning and bring it.  You can try to call him if you want to clarify the email?  I am not sure why it did not work.

## 2018-09-17 NOTE — Telephone Encounter (Signed)
I sent email, will wait for response.

## 2018-09-17 NOTE — Telephone Encounter (Signed)
Patient came into office, left envelope for Dr Aline Brochure, I will forward to Dr Aline Brochure.

## 2018-09-17 NOTE — Telephone Encounter (Signed)
I called back to patient; he verified his email as:  [email or web address] ("no dot between first and last name") - asking if Abigail Butts can re-send the email as discussed?

## 2018-09-24 ENCOUNTER — Encounter: Payer: Self-pay | Admitting: Orthopedic Surgery

## 2018-09-28 ENCOUNTER — Encounter: Payer: Self-pay | Admitting: Orthopedic Surgery

## 2018-09-29 ENCOUNTER — Ambulatory Visit: Payer: BLUE CROSS/BLUE SHIELD | Admitting: Family Medicine

## 2018-10-06 ENCOUNTER — Other Ambulatory Visit: Payer: Self-pay

## 2018-10-06 ENCOUNTER — Ambulatory Visit (INDEPENDENT_AMBULATORY_CARE_PROVIDER_SITE_OTHER): Payer: BLUE CROSS/BLUE SHIELD | Admitting: Family Medicine

## 2018-10-06 DIAGNOSIS — F909 Attention-deficit hyperactivity disorder, unspecified type: Secondary | ICD-10-CM

## 2018-10-06 DIAGNOSIS — E119 Type 2 diabetes mellitus without complications: Secondary | ICD-10-CM

## 2018-10-06 MED ORDER — METHYLPHENIDATE HCL ER (OSM) 27 MG PO TBCR: 27.0000 mg | EXTENDED_RELEASE_TABLET | ORAL | 0 refills | Status: DC

## 2018-10-06 MED ORDER — GLIPIZIDE 5 MG PO TABS: 5.0000 mg | ORAL_TABLET | Freq: Two times a day (BID) | ORAL | 1 refills | Status: DC

## 2018-10-06 MED ORDER — METFORMIN HCL 500 MG PO TABS: 1000.0000 mg | ORAL_TABLET | Freq: Two times a day (BID) | ORAL | 1 refills | Status: DC

## 2018-10-06 NOTE — Progress Notes (Signed)
   Subjective:  Video plus audio  Patient calls multiple concerns  Patient ID: Paul Barnes, male    DOB: 26-Nov-1965, 53 y.o.   MRN: [id number]  HPI  Patient calls for a follow up on ADHD- doing well on Concerta 36mg  and needs refill. Patient reports no problems or concerns.  Virtual Visit via Video Note  I connected with Paul Barnes on 10/06/18 at  1:40 PM EDT by a video enabled telemedicine application and verified that I am speaking with the correct person using two identifiers.  Location: Patient: home Provider: office   I discussed the limitations of evaluation and management by telemedicine and the availability of in person appointments. The patient expressed understanding and agreed to proceed.  History of Present Illness:    Observations/Objective:   Assessment and Plan:   Follow Up Instructions:    I discussed the assessment and treatment plan with the patient. The patient was provided an opportunity to ask questions and all were answered. The patient agreed with the plan and demonstrated an understanding of the instructions.   The patient was advised to call back or seek an in-person evaluation if the symptoms worsen or if the condition fails to improve as anticipated.  I provided minutes of non-face-to-face time during this encounter.  Patient notes challenges with irritability in the evening.  Also notes difficulty sleeping at night.  Reports that the ADHD medicine definitely helping his focusing during the day.  Wonders if he needs to be on a higher dose or lower dose.  States he has been having low sugar spells.  Spoke with the dietitian.  She recommended not taking the Glucotrol and days from its month.  Pretty much at this point is come off good control.  Notes fasting sugars in the low 100s  Exercising regularly and watching his diet   Review of Systems No headache, no major weight loss or weight gain, no chest pain no back pain abdominal  pain no change in bowel habits complete ROS otherwise negative     Objective:   Physical Exam Virtual visit       Assessment & Plan:  Impression 1 type 2 diabetes.  Tight control.  Rare occasional use of Glucotrol.  Diet discussed exercise discussed medications refilled  2.  ADHD.  Unsatisfactory side effects.  Will cut down the dosage during the day.  Rationale discussed.  Greater than 50% of this 25 minute face to face visit was spent in counseling and discussion and coordination of care regarding the above diagnosis/diagnosies

## 2018-10-21 ENCOUNTER — Telehealth: Payer: Self-pay | Admitting: Family Medicine

## 2018-10-21 NOTE — Telephone Encounter (Signed)
PA needed for Concerta 27 mg tablets. Tried to do PA through Cover My Med but saying member not found. Left message for patient to return call to get insurance information.

## 2018-10-30 NOTE — Telephone Encounter (Signed)
Pt states to use his secondary insurance this is the one he has to use to get med covered. It is listed in his chart

## 2018-10-30 NOTE — Telephone Encounter (Signed)
Submitted pa through cover my meds. Await results.

## 2018-11-05 NOTE — Telephone Encounter (Signed)
Medication approved per [facility] on [address] and will be filled and ready for pick up this pm  Left message to return call to notify patient.

## 2018-11-09 ENCOUNTER — Encounter: Payer: BLUE CROSS/BLUE SHIELD | Admitting: Family Medicine

## 2018-11-12 NOTE — Telephone Encounter (Signed)
Pt notified and he states he picked up the med.

## 2018-11-15 ENCOUNTER — Other Ambulatory Visit: Payer: Self-pay | Admitting: Family Medicine

## 2018-12-11 ENCOUNTER — Other Ambulatory Visit: Payer: Self-pay | Admitting: Family Medicine

## 2018-12-20 ENCOUNTER — Other Ambulatory Visit: Payer: Self-pay | Admitting: Orthopedic Surgery

## 2018-12-20 DIAGNOSIS — Z9889 Other specified postprocedural states: Secondary | ICD-10-CM

## 2018-12-20 DIAGNOSIS — M1712 Unilateral primary osteoarthritis, left knee: Secondary | ICD-10-CM

## 2018-12-28 ENCOUNTER — Telehealth: Payer: Self-pay | Admitting: Family Medicine

## 2018-12-28 NOTE — Telephone Encounter (Signed)
Called for a refill on Concerta and I told patient to check with the pharmacy because I show 4 month supply was sent on 10/06/18 so he should have another refill left.  Patient said he will call [facility].

## 2019-02-18 ENCOUNTER — Ambulatory Visit (INDEPENDENT_AMBULATORY_CARE_PROVIDER_SITE_OTHER): Payer: BC Managed Care – PPO | Admitting: Family Medicine

## 2019-02-18 ENCOUNTER — Encounter: Payer: Self-pay | Admitting: Family Medicine

## 2019-02-18 DIAGNOSIS — F909 Attention-deficit hyperactivity disorder, unspecified type: Secondary | ICD-10-CM | POA: Diagnosis not present

## 2019-02-18 MED ORDER — METHYLPHENIDATE HCL ER (OSM) 18 MG PO TBCR: EXTENDED_RELEASE_TABLET | ORAL | 0 refills | Status: DC

## 2019-02-18 NOTE — Progress Notes (Signed)
   Subjective:    Patient ID: Paul Barnes, male    DOB: 1965/08/17, 53 y.o.   MRN: [id number]  HPI Patient was seen today for ADD checkup.  This patient does have ADD.  Patient takes medications for this.  If this does help control overall symptoms.  Please see below. -weight, vital signs reviewed.  The following items were covered. -Compliance with medication : Concerta 27 mg take one daily   -Problems with completing homework, paying attention/taking good notes in school: none  -grades: n/a  - Eating patterns : eating well  -sleeping: not sleeping well; pt believes the 27 mg is keeping him awake.   -Additional issues or questions: pt would like to go back to Concerta 36 mg   Review of Systems No headache, no major weight loss or weight gain, no chest pain no back pain abdominal pain no change in bowel habits complete ROS otherwise negative     Objective:   Physical Exam  Virtual      Assessment & Plan:  Impression ADHD.  Good control.  Medication definitely helping.  Patient still having trouble getting to sleep at night.  Admits to some mornings taking it later in the day.  Would like to try actually a lower dose and see if it still benefits helps while cutting back on the insomnia.  Not down 18 mg rationale discussed recommend face-to-face visit in 3 months for all chronic problems

## 2019-03-12 ENCOUNTER — Other Ambulatory Visit: Payer: Self-pay | Admitting: Family Medicine

## 2019-03-23 ENCOUNTER — Other Ambulatory Visit: Payer: Self-pay | Admitting: Family Medicine

## 2019-03-23 ENCOUNTER — Other Ambulatory Visit: Payer: Self-pay | Admitting: Orthopedic Surgery

## 2019-03-23 DIAGNOSIS — M1712 Unilateral primary osteoarthritis, left knee: Secondary | ICD-10-CM

## 2019-03-23 DIAGNOSIS — Z9889 Other specified postprocedural states: Secondary | ICD-10-CM

## 2019-03-24 ENCOUNTER — Telehealth: Payer: Self-pay | Admitting: Family Medicine

## 2019-03-24 NOTE — Telephone Encounter (Signed)
Pt states that the pharmacy does not have his refill for his Concerta (system shows we ordered 3 in September - pt states pharmacy says they do not have one for this month)   Please advise & call pt      [address]

## 2019-03-24 NOTE — Telephone Encounter (Signed)
Contacted patient to see which mg he was requesting. Pt was at the pharmacy and stated that the pharmacist states she does have the med

## 2019-04-14 ENCOUNTER — Telehealth: Payer: Self-pay | Admitting: Family Medicine

## 2019-04-14 ENCOUNTER — Other Ambulatory Visit: Payer: Self-pay | Admitting: *Deleted

## 2019-04-14 MED ORDER — METFORMIN HCL 500 MG PO TABS: 1000.0000 mg | ORAL_TABLET | Freq: Two times a day (BID) | ORAL | 0 refills | Status: DC

## 2019-04-14 NOTE — Telephone Encounter (Signed)
Patient came in the office and asked that his Metformin 500 be refilled to [facility].

## 2019-04-14 NOTE — Telephone Encounter (Signed)
360 tablets were sent to [facility] on [address] on 03/12/2019. Left message to return call

## 2019-04-14 NOTE — Telephone Encounter (Signed)
Pt.notified

## 2019-04-20 ENCOUNTER — Other Ambulatory Visit: Payer: Self-pay | Admitting: Family Medicine

## 2019-05-04 ENCOUNTER — Telehealth: Payer: Self-pay | Admitting: Family Medicine

## 2019-05-04 MED ORDER — METHYLPHENIDATE HCL ER (OSM) 18 MG PO TBCR: EXTENDED_RELEASE_TABLET | ORAL | 0 refills | Status: DC

## 2019-05-04 NOTE — Telephone Encounter (Signed)
Pt needs face to face with provider per 02/18/2019 note. Please call pt to set up appt. Thank you

## 2019-05-04 NOTE — Telephone Encounter (Signed)
Pt is scheduled for 12/3 in office.

## 2019-05-04 NOTE — Telephone Encounter (Signed)
Pt request refill on methylphenidate (CONCERTA) 18 MG PO CR tablet. [id number] - [place], [address]

## 2019-05-04 NOTE — Telephone Encounter (Signed)
Medication pending; pt contacted and verbalized understanding.

## 2019-05-04 NOTE — Telephone Encounter (Signed)
Please advise. Thank you

## 2019-05-04 NOTE — Telephone Encounter (Signed)
Ok times one 

## 2019-05-06 ENCOUNTER — Ambulatory Visit: Payer: BC Managed Care – PPO | Admitting: Family Medicine

## 2019-05-10 ENCOUNTER — Other Ambulatory Visit: Payer: Self-pay

## 2019-05-10 ENCOUNTER — Ambulatory Visit: Payer: BC Managed Care – PPO | Admitting: Family Medicine

## 2019-05-23 ENCOUNTER — Other Ambulatory Visit: Payer: Self-pay | Admitting: Family Medicine

## 2019-05-24 ENCOUNTER — Other Ambulatory Visit: Payer: Self-pay

## 2019-05-24 ENCOUNTER — Ambulatory Visit: Payer: BC Managed Care – PPO | Admitting: Family Medicine

## 2019-05-25 ENCOUNTER — Ambulatory Visit (INDEPENDENT_AMBULATORY_CARE_PROVIDER_SITE_OTHER): Payer: BC Managed Care – PPO | Admitting: Family Medicine

## 2019-05-25 DIAGNOSIS — F909 Attention-deficit hyperactivity disorder, unspecified type: Secondary | ICD-10-CM | POA: Diagnosis not present

## 2019-05-25 DIAGNOSIS — Z125 Encounter for screening for malignant neoplasm of prostate: Secondary | ICD-10-CM | POA: Diagnosis not present

## 2019-05-25 DIAGNOSIS — E785 Hyperlipidemia, unspecified: Secondary | ICD-10-CM

## 2019-05-25 DIAGNOSIS — Z79899 Other long term (current) drug therapy: Secondary | ICD-10-CM

## 2019-05-25 MED ORDER — METFORMIN HCL 500 MG PO TABS: 1000.0000 mg | ORAL_TABLET | Freq: Two times a day (BID) | ORAL | 1 refills | Status: DC

## 2019-05-25 MED ORDER — GLIPIZIDE 5 MG PO TABS: 5.0000 mg | ORAL_TABLET | Freq: Two times a day (BID) | ORAL | 1 refills | Status: DC

## 2019-05-25 NOTE — Progress Notes (Signed)
   Subjective:    Patient ID: Paul Barnes, male    DOB: 12-16-1965, 53 y.o.   MRN: [id number]  HPI Patient was seen today for ADD checkup.  This patient does have ADD.  Patient takes medications for this.  If this does help control overall symptoms.  Please see below. -weight, vital signs reviewed.  The following items were covered. -Compliance with medication : Concerta 18 mg each morning  -Problems with completing homework, paying attention/taking good notes in school: none  -grades: n/a  - Eating patterns : eats well  -sleeping: no issues  -Additional issues or questions: none  Virtual Visit via Telephone Note  I connected with Paul Barnes on 05/25/19 at  3:00 PM EST by telephone and verified that I am speaking with the correct person using two identifiers.  Location: Patient: home Provider: office   I discussed the limitations, risks, security and privacy concerns of performing an evaluation and management service by telephone and the availability of in person appointments. I also discussed with the patient that there may be a patient responsible charge related to this service. The patient expressed understanding and agreed to proceed.   History of Present Illness:    Observations/Objective:   Assessment and Plan:   Follow Up Instructions:    I discussed the assessment and treatment plan with the patient. The patient was provided an opportunity to ask questions and all were answered. The patient agreed with the plan and demonstrated an understanding of the instructions.   The patient was advised to call back or seek an in-person evaluation if the symptoms worsen or if the condition fails to improve as anticipated.  I provided 20 minutes of non-face-to-face time during this encounter.   Vicente Males, LPN  Patient compliant with ADHD medication.  Definitely helping.  No negative side effects.  Would like to maintain same dose of meds.  Compliance  discussed.  Review of Systems No headache, no major weight loss or weight gain, no chest pain no back pain abdominal pain no change in bowel habits complete ROS otherwise negative     Objective:   Physical Exam   Virtual     Assessment & Plan:  Imp adhd god control , comp disc, meds refilled  Needs b w, rationale disc

## 2019-05-28 ENCOUNTER — Encounter: Payer: Self-pay | Admitting: Family Medicine

## 2019-05-28 MED ORDER — METHYLPHENIDATE HCL ER (OSM) 18 MG PO TBCR: EXTENDED_RELEASE_TABLET | ORAL | 0 refills | Status: DC

## 2019-06-09 ENCOUNTER — Other Ambulatory Visit: Payer: Self-pay

## 2019-06-09 ENCOUNTER — Ambulatory Visit: Payer: Federal, State, Local not specified - PPO | Attending: Internal Medicine

## 2019-06-09 DIAGNOSIS — Z20822 Contact with and (suspected) exposure to covid-19: Secondary | ICD-10-CM | POA: Diagnosis not present

## 2019-06-10 ENCOUNTER — Ambulatory Visit (INDEPENDENT_AMBULATORY_CARE_PROVIDER_SITE_OTHER): Payer: BC Managed Care – PPO | Admitting: Family Medicine

## 2019-06-10 DIAGNOSIS — J019 Acute sinusitis, unspecified: Secondary | ICD-10-CM

## 2019-06-10 MED ORDER — AZITHROMYCIN 250 MG PO TABS: ORAL_TABLET | ORAL | 0 refills | Status: DC

## 2019-06-10 NOTE — Progress Notes (Signed)
   Subjective:    Patient ID: Paul Barnes, male    DOB: 03/04/1966, 54 y.o.   MRN: [id number]  Sinus Problem This is a new problem. The current episode started in the past 7 days. Associated symptoms include congestion, coughing, headaches and a sore throat. (Fever)   Had Covid test yesterday-awaiting results Significant sinus congestion drainage coughing no wheezing or difficulty breathing.  Energy level is low.  Review of Systems  HENT: Positive for congestion and sore throat.   Respiratory: Positive for cough.   Neurological: Positive for headaches.   Virtual Visit via Video Note  I connected with Paul Barnes on 06/10/19 at  4:30 PM EST by a video enabled telemedicine application and verified that I am speaking with the correct person using two identifiers.  Location: Patient: home Provider: office   I discussed the limitations of evaluation and management by telemedicine and the availability of in person appointments. The patient expressed understanding and agreed to proceed.  History of Present Illness:    Observations/Objective:   Assessment and Plan:   Follow Up Instructions:    I discussed the assessment and treatment plan with the patient. The patient was provided an opportunity to ask questions and all were answered. The patient agreed with the plan and demonstrated an understanding of the instructions.   The patient was advised to call back or seek an in-person evaluation if the symptoms worsen or if the condition fails to improve as anticipated.  I provided 16 minutes of non-face-to-face time during this encounter.        Objective:   Physical Exam   Today's visit was via telephone Physical exam was not possible for this visit      Assessment & Plan:  Viral syndrome Possible Covid Await the testing Recommend staying isolated from others wearing a mask.  Also in addition this antibiotic sent in for the possibility sinus infection.   Warning signs were discussed in detail.  If progressive shortness of breath or severely worse go to ER.

## 2019-06-11 ENCOUNTER — Encounter: Payer: Self-pay | Admitting: Family Medicine

## 2019-06-11 ENCOUNTER — Ambulatory Visit (INDEPENDENT_AMBULATORY_CARE_PROVIDER_SITE_OTHER): Payer: BC Managed Care – PPO | Admitting: Family Medicine

## 2019-06-11 ENCOUNTER — Telehealth: Payer: Federal, State, Local not specified - PPO | Admitting: Nurse Practitioner

## 2019-06-11 DIAGNOSIS — R5081 Fever presenting with conditions classified elsewhere: Secondary | ICD-10-CM

## 2019-06-11 DIAGNOSIS — U071 COVID-19: Secondary | ICD-10-CM | POA: Diagnosis not present

## 2019-06-11 DIAGNOSIS — R059 Cough, unspecified: Secondary | ICD-10-CM

## 2019-06-11 DIAGNOSIS — R05 Cough: Secondary | ICD-10-CM

## 2019-06-11 LAB — NOVEL CORONAVIRUS, NAA: SARS-CoV-2, NAA: DETECTED — AB

## 2019-06-11 MED ORDER — PROMETHAZINE-DM 6.25-15 MG/5ML PO SYRP: 5.0000 mL | ORAL_SOLUTION | Freq: Four times a day (QID) | ORAL | 0 refills | Status: AC | PRN

## 2019-06-11 NOTE — Progress Notes (Signed)
   Subjective:  Audio only  Patient ID: Paul Barnes, male    DOB: 1965/11/21, 54 y.o.   MRN: [id number]  HPIpt test came back positive for covid. Having chills, temp 102.2. some wheezing. Prescribed zpack yesterday.  Virtual Visit via Telephone Note  I connected with Paul Barnes on 06/11/19 at  1:10 PM EST by telephone and verified that I am speaking with the correct person using two identifiers.  Location: Patient: home Provider: office   I discussed the limitations, risks, security and privacy concerns of performing an evaluation and management service by telephone and the availability of in person appointments. I also discussed with the patient that there may be a patient responsible charge related to this service. The patient expressed understanding and agreed to proceed.   History of Present Illness:    Observations/Objective:   Assessment and Plan:   Follow Up Instructions:    I discussed the assessment and treatment plan with the patient. The patient was provided an opportunity to ask questions and all were answered. The patient agreed with the plan and demonstrated an understanding of the instructions.   The patient was advised to call back or seek an in-person evaluation if the symptoms worsen or if the condition fails to improve as anticipated.  I provided 18 minutes of non-face-to-face time during this encounter.   Patient has been diagnosed COVID-19 positive.  He has ongoing concerns.  Sense of a wheezy cough.  Still having fevers at times.  Positive test.  Currently on Z-Pak.  Diabetic sugars overall running good.  Wonders if he should be taking steroids.   Review of Systems No chest pain no excessive shortness of breath no vomiting    Objective:   Physical Exam  Virtual      Assessment & Plan:  Impression COVID-19.  Additional concerns discussed at length.  No steroids rationale discussed.  Warning signs discussed.  Therapy discussed.

## 2019-06-11 NOTE — Telephone Encounter (Signed)
Called pt and added to schedule today to talk with dr Richardson Landry.

## 2019-06-11 NOTE — Progress Notes (Signed)
E-Visit for Corona Virus Screening   Your current symptoms are consistent with the coronavirus.   We are enrolling you in our [place] for [place] . Daily you will receive a questionnaire within the [facility] website. Our COVID 19 response team will be monitoring your responses daily.  We are enrolling you in our [place] for [place] . Daily you will receive a questionnaire within the [place] website. Our COVID 19 response team will be monitoring your responses daily.  Please quarantine yourself while awaiting your test results. If you develop fever/cough/breathlessness, please stay home for 10 days with improving symptoms and until you have had 24 hours of no fever (without taking a fever reducer).  You should wear a mask or cloth face covering over your nose and mouth if you must be around other people or animals, including pets (even at home). Try to stay at least 6 feet away from other people. This will protect the people around you.  Please continue good preventive care measures, including:  frequent hand-washing, avoid touching your face, cover coughs/sneezes, stay out of crowds and keep a 6 foot distance from others.  COVID-19 is a respiratory illness with symptoms that are similar to the flu. Symptoms are typically mild to moderate, but there have been cases of severe illness and death due to the virus.   The following symptoms may appear 2-14 days after exposure: . Fever . Cough . Shortness of breath or difficulty breathing . Chills . Repeated shaking with chills . Muscle pain . Headache . Sore throat . New loss of taste or smell . Fatigue . Congestion or runny nose . Nausea or vomiting . Diarrhea  Go to the nearest hospital ED for assessment if fever/cough/breathlessness are severe or illness seems like a threat to life.  It is vitally important that if you feel that you have an infection such as this virus or any other virus that you stay home and away  from places where you may spread it to others.  You should avoid contact with people age 91 and older.   You can use medication such as A prescription cough medication called Phenergan DM 6.25 mg/15 mg. You make take one teaspoon / 5 ml every 4-6 hours as needed for cough.  You may continue the symptomatic treatment you are currently taking.    You may also take acetaminophen (Tylenol) as needed for fever.  Reduce your risk of any infection by using the same precautions used for avoiding the common cold or flu:  Marland Kitchen Wash your hands often with soap and warm water for at least 20 seconds.  If soap and water are not readily available, use an alcohol-based hand sanitizer with at least 60% alcohol.  . If coughing or sneezing, cover your mouth and nose by coughing or sneezing into the elbow areas of your shirt or coat, into a tissue or into your sleeve (not your hands). . Avoid shaking hands with others and consider head nods or verbal greetings only. . Avoid touching your eyes, nose, or mouth with unwashed hands.  . Avoid close contact with people who are sick. . Avoid places or events with large numbers of people in one location, like concerts or sporting events. . Carefully consider travel plans you have or are making. . If you are planning any travel outside or inside the [place], visit the CDC's Travelers' Health webpage for the latest health notices. . If you have some symptoms but not all symptoms,  continue to monitor at home and seek medical attention if your symptoms worsen. . If you are having a medical emergency, call 911.  HOME CARE . Only take medications as instructed by your medical team. . Drink plenty of fluids and get plenty of rest. . A steam or ultrasonic humidifier can help if you have congestion.   GET HELP RIGHT AWAY IF YOU HAVE EMERGENCY WARNING SIGNS** FOR COVID-19. If you or someone is showing any of these signs seek emergency medical care immediately. Call 911 or proceed to your  closest emergency facility if: . You develop worsening high fever. . Trouble breathing . Bluish lips or face . Persistent pain or pressure in the chest . New confusion . Inability to wake or stay awake . You cough up blood. . Your symptoms become more severe  **This list is not all possible symptoms. Contact your medical provider for any symptoms that are sever or concerning to you.  MAKE SURE YOU   Understand these instructions.  Will watch your condition.  Will get help right away if you are not doing well or get worse.  Your e-visit answers were reviewed by a board certified advanced clinical practitioner to complete your personal care plan.  Depending on the condition, your plan could have included both over the counter or prescription medications.  If there is a problem please reply once you have received a response from your provider.  Your safety is important to [place].  If you have drug allergies check your prescription carefully.    You can use MyChart to ask questions about today's visit, request a non-urgent call back, or ask for a work or school excuse for 24 hours related to this e-Visit. If it has been greater than 24 hours you will need to follow up with your provider, or enter a new e-Visit to address those concerns. You will get an e-mail in the next two days asking about your experience.  I hope that your e-visit has been valuable and will speed your recovery. Thank you for using e-visits.  I have spent at least 5 minutes reviewing and documenting in the patient's chart.

## 2019-06-15 ENCOUNTER — Other Ambulatory Visit: Payer: Self-pay

## 2019-06-15 ENCOUNTER — Encounter: Payer: Self-pay | Admitting: Family Medicine

## 2019-06-15 ENCOUNTER — Other Ambulatory Visit: Payer: Self-pay | Admitting: Critical Care Medicine

## 2019-06-15 ENCOUNTER — Ambulatory Visit (INDEPENDENT_AMBULATORY_CARE_PROVIDER_SITE_OTHER): Payer: BC Managed Care – PPO | Admitting: Family Medicine

## 2019-06-15 ENCOUNTER — Encounter: Payer: Self-pay | Admitting: Critical Care Medicine

## 2019-06-15 ENCOUNTER — Telehealth: Payer: Self-pay | Admitting: Critical Care Medicine

## 2019-06-15 DIAGNOSIS — R509 Fever, unspecified: Secondary | ICD-10-CM

## 2019-06-15 DIAGNOSIS — M791 Myalgia, unspecified site: Secondary | ICD-10-CM | POA: Diagnosis not present

## 2019-06-15 DIAGNOSIS — U071 COVID-19: Secondary | ICD-10-CM | POA: Diagnosis not present

## 2019-06-15 DIAGNOSIS — R05 Cough: Secondary | ICD-10-CM | POA: Diagnosis not present

## 2019-06-15 DIAGNOSIS — E1165 Type 2 diabetes mellitus with hyperglycemia: Secondary | ICD-10-CM

## 2019-06-15 MED ORDER — DOXYCYCLINE HYCLATE 100 MG PO TABS: 100.0000 mg | ORAL_TABLET | Freq: Two times a day (BID) | ORAL | 0 refills | Status: DC

## 2019-06-15 NOTE — Progress Notes (Signed)
   Subjective:  Audio  Patient ID: Paul Barnes, male    DOB: 1966-03-21, 54 y.o.   MRN: [id number]  HPI  Patient calls to discuss recent positive Covid results. Patient states he is still running fever 99-101 on day 8 of illness. Patient also had body aches and intermittent cough.  Virtual Visit via Video Note  I connected with Paul Barnes on 06/15/19 at  1:10 PM EST by a video enabled telemedicine application and verified that I am speaking with the correct person using two identifiers.  Location: Patient: home Provider: office   I discussed the limitations of evaluation and management by telemedicine and the availability of in person appointments. The patient expressed understanding and agreed to proceed.  History of Present Illness:    Observations/Objective:   Assessment and Plan:   Follow Up Instructions:    I discussed the assessment and treatment plan with the patient. The patient was provided an opportunity to ask questions and all were answered. The patient agreed with the plan and demonstrated an understanding of the instructions.   The patient was advised to call back or seek an in-person evaluation if the symptoms worsen or if the condition fails to improve as anticipated.  I provided 28minutes of non-face-to-face time during this encounter.  Fever ongoing  Off and on  Ongoing cough   Sore from coughing   No sig sob other than anyone  No smoking  92 to 97  93 93 and 95  Most recently O2 sat toggling between 89 and 91  Review of Systems No headache positive diffuse achiness diminished energy    Objective:   Physical Exam  Virtual      Assessment & Plan:  Impression persistent fever cough occasional sense of shortness of breath mild hypoxemia.  Add Doxy days.  Patient instructed to call the monoclonal antibody folks rationale discussed.  May well benefit.  Has a couple chief risk factors with diabetes hypertension and age since  he is on day 8 needs to call right away.  Warning signs discussed carefully.  His O2 sats drifted into the 80s and did not come back up quickly into the 90s would be trigger for heading to the emergency

## 2019-06-15 NOTE — Progress Notes (Signed)
  I connected by phone with Paul Barnes on 06/15/2019 at 3:31 PM to discuss the potential use of an new treatment for mild to moderate COVID-19 viral infection in non-hospitalized patients.  This patient is a 54 y.o. male that meets the FDA criteria for Emergency Use Authorization of bamlanivimab or casirivimab\imdevimab.  Has a (+) direct SARS-CoV-2 viral test result  Has mild or moderate COVID-19   Is ? 55 years of age and weighs ? 40 kg  Is NOT hospitalized due to COVID-19  Is NOT requiring oxygen therapy or requiring an increase in baseline oxygen flow rate due to COVID-19  Is within 10 days of symptom onset  Has at least one of the high risk factor(s) for progression to severe COVID-19 and/or hospitalization as defined in EUA.  Specific high risk criteria : Diabetes   I have spoken and communicated the following to the patient or parent/caregiver:  1. FDA has authorized the emergency use of bamlanivimab and casirivimab\imdevimab for the treatment of mild to moderate COVID-19 in adults and pediatric patients with positive results of direct SARS-CoV-2 viral testing who are 68 years of age and older weighing at least 40 kg, and who are at high risk for progressing to severe COVID-19 and/or hospitalization.  2. The significant known and potential risks and benefits of bamlanivimab and casirivimab\imdevimab, and the extent to which such potential risks and benefits are unknown.  3. Information on available alternative treatments and the risks and benefits of those alternatives, including clinical trials.  4. Patients treated with bamlanivimab and casirivimab\imdevimab should continue to self-isolate and use infection control measures (e.g., wear mask, isolate, social distance, avoid sharing personal items, clean and disinfect "high touch" surfaces, and frequent handwashing) according to CDC guidelines.   5. The patient or parent/caregiver has the option to accept or refuse  bamlanivimab or casirivimab\imdevimab .  After reviewing this information with the patient, The patient agreed to proceed with receiving the bamlanimivab infusion and will be provided a copy of the Fact sheet prior to receiving the infusion.Asencion Noble 06/15/2019 3:31 PM

## 2019-06-15 NOTE — Telephone Encounter (Signed)
Called and scheduled pt an appt today for recheck

## 2019-06-15 NOTE — Telephone Encounter (Signed)
I connected with this patient is Covid positive from January 6 he has had symptoms since January 5 he said increased fever cough muscle aches pains.  His temperature continues to fluctuate this week and he does have type 2 diabetes with increased glucose levels.  He qualifies there for monoclonal antibody infusion and we will plan to infuse him on January 15 which would be day 10 of his illness course I did asked to move him up to January 14 if there is a cancellation  Called to discuss with patient about Covid symptoms and the use of bamlanivimab, a monoclonal antibody infusion for those with mild to moderate Covid symptoms and at a high risk of hospitalization.  Pt is qualified for this infusion at the [facility] infusion center due to Diabetes

## 2019-06-18 ENCOUNTER — Ambulatory Visit (HOSPITAL_COMMUNITY)
Admission: RE | Admit: 2019-06-18 | Discharge: 2019-06-18 | Disposition: A | Discharge status: Home / Self Care | Payer: BC Managed Care – PPO | Source: Ambulatory Visit | Attending: Pulmonary Disease | Admitting: Pulmonary Disease

## 2019-06-18 DIAGNOSIS — U071 COVID-19: Secondary | ICD-10-CM | POA: Diagnosis not present

## 2019-06-18 DIAGNOSIS — Z23 Encounter for immunization: Secondary | ICD-10-CM | POA: Diagnosis not present

## 2019-06-18 DIAGNOSIS — E1165 Type 2 diabetes mellitus with hyperglycemia: Secondary | ICD-10-CM | POA: Insufficient documentation

## 2019-06-18 MED ORDER — EPINEPHRINE 0.3 MG/0.3ML IJ SOAJ: 0.3000 mg | Freq: Once | INTRAMUSCULAR | Status: DC | PRN

## 2019-06-18 MED ORDER — ALBUTEROL SULFATE HFA 108 (90 BASE) MCG/ACT IN AERS: 2.0000 | INHALATION_SPRAY | Freq: Once | RESPIRATORY_TRACT | Status: DC | PRN

## 2019-06-18 MED ORDER — METHYLPREDNISOLONE SODIUM SUCC 125 MG IJ SOLR: 125.0000 mg | Freq: Once | INTRAMUSCULAR | Status: DC | PRN

## 2019-06-18 MED ORDER — SODIUM CHLORIDE 0.9 % IV SOLN: INTRAVENOUS | Status: DC | PRN

## 2019-06-18 MED ORDER — SODIUM CHLORIDE 0.9 % IV SOLN
700.0000 mg | Freq: Once | INTRAVENOUS | Status: AC
  Administered 2019-06-18: 700 mg via INTRAVENOUS
  Filled 2019-06-18: qty 20

## 2019-06-18 MED ORDER — FAMOTIDINE IN NACL 20-0.9 MG/50ML-% IV SOLN: 20.0000 mg | Freq: Once | INTRAVENOUS | Status: DC | PRN

## 2019-06-18 MED ORDER — DIPHENHYDRAMINE HCL 50 MG/ML IJ SOLN: 50.0000 mg | Freq: Once | INTRAMUSCULAR | Status: DC | PRN

## 2019-06-18 NOTE — Discharge Instructions (Signed)

## 2019-06-18 NOTE — Progress Notes (Signed)
  Diagnosis: COVID-19  Physician:Dr. Joya Gaskins  Procedure: [facility] Infusion [facility] Med: bamlanivimab infusion - Provided patient with bamlanimivab fact sheet for patients, parents and caregivers prior to infusion.  Complications: No immediate complications noted.  Discharge: Discharged home   Paul Barnes N Paul Barnes 06/18/2019

## 2019-06-30 ENCOUNTER — Other Ambulatory Visit: Payer: Self-pay | Admitting: Orthopedic Surgery

## 2019-06-30 DIAGNOSIS — Z9889 Other specified postprocedural states: Secondary | ICD-10-CM

## 2019-06-30 DIAGNOSIS — M1712 Unilateral primary osteoarthritis, left knee: Secondary | ICD-10-CM

## 2019-07-07 ENCOUNTER — Encounter: Payer: Self-pay | Admitting: Family Medicine

## 2019-09-27 ENCOUNTER — Telehealth: Payer: Self-pay | Admitting: Family Medicine

## 2019-09-27 DIAGNOSIS — Z125 Encounter for screening for malignant neoplasm of prostate: Secondary | ICD-10-CM | POA: Diagnosis not present

## 2019-09-27 DIAGNOSIS — Z79899 Other long term (current) drug therapy: Secondary | ICD-10-CM | POA: Diagnosis not present

## 2019-09-27 DIAGNOSIS — E785 Hyperlipidemia, unspecified: Secondary | ICD-10-CM | POA: Diagnosis not present

## 2019-09-27 MED ORDER — METHYLPHENIDATE HCL ER (OSM) 18 MG PO TBCR: EXTENDED_RELEASE_TABLET | ORAL | 0 refills | Status: DC

## 2019-09-27 NOTE — Telephone Encounter (Signed)
Pt has med check next week with Dr. Richardson Landry but needs refill on methylphenidate (CONCERTA) 18 MG PO CR tablet  [id number] - [place], [place] [address]

## 2019-09-28 LAB — BASIC METABOLIC PANEL
BUN/Creatinine Ratio: 14 (ref 9–20)
BUN: 15 mg/dL (ref 6–24)
CO2: 21 mmol/L (ref 20–29)
Calcium: 9.6 mg/dL (ref 8.7–10.2)
Chloride: 106 mmol/L (ref 96–106)
Creatinine, Ser: 1.06 mg/dL (ref 0.76–1.27)
GFR calc Af Amer: 91 mL/min/{1.73_m2} (ref >59)
GFR calc non Af Amer: 79 mL/min/{1.73_m2} (ref >59)
Glucose: 158 mg/dL — ABNORMAL HIGH (ref 65–99)
Potassium: 4.5 mmol/L (ref 3.5–5.2)
Sodium: 142 mmol/L (ref 134–144)

## 2019-09-28 LAB — PSA: Prostate Specific Ag, Serum: 0.5 ng/mL (ref 0.0–4.0)

## 2019-09-28 LAB — LIPID PANEL
Chol/HDL Ratio: 4.8 ratio (ref 0.0–5.0)
Cholesterol, Total: 228 mg/dL — ABNORMAL HIGH (ref 100–199)
HDL: 48 mg/dL (ref >39)
LDL Chol Calc (NIH): 144 mg/dL — ABNORMAL HIGH (ref 0–99)
Triglycerides: 201 mg/dL — ABNORMAL HIGH (ref 0–149)
VLDL Cholesterol Cal: 36 mg/dL (ref 5–40)

## 2019-09-28 LAB — HEPATIC FUNCTION PANEL
ALT: 29 IU/L (ref 0–44)
AST: 17 IU/L (ref 0–40)
Albumin: 4.4 g/dL (ref 3.8–4.9)
Alkaline Phosphatase: 72 IU/L (ref 39–117)
Bilirubin Total: 0.5 mg/dL (ref 0.0–1.2)
Bilirubin, Direct: 0.14 mg/dL (ref 0.00–0.40)
Total Protein: 6.5 g/dL (ref 6.0–8.5)

## 2019-10-06 ENCOUNTER — Other Ambulatory Visit: Payer: Self-pay

## 2019-10-06 ENCOUNTER — Telehealth (INDEPENDENT_AMBULATORY_CARE_PROVIDER_SITE_OTHER): Payer: BC Managed Care – PPO | Admitting: Family Medicine

## 2019-10-06 ENCOUNTER — Telehealth: Payer: Self-pay | Admitting: *Deleted

## 2019-10-06 DIAGNOSIS — F909 Attention-deficit hyperactivity disorder, unspecified type: Secondary | ICD-10-CM

## 2019-10-06 DIAGNOSIS — E119 Type 2 diabetes mellitus without complications: Secondary | ICD-10-CM

## 2019-10-06 DIAGNOSIS — E785 Hyperlipidemia, unspecified: Secondary | ICD-10-CM

## 2019-10-06 MED ORDER — METHYLPHENIDATE HCL ER (OSM) 18 MG PO TBCR: EXTENDED_RELEASE_TABLET | ORAL | 0 refills | Status: DC

## 2019-10-06 MED ORDER — METFORMIN HCL 500 MG PO TABS: 1000.0000 mg | ORAL_TABLET | Freq: Two times a day (BID) | ORAL | 1 refills | Status: DC

## 2019-10-06 MED ORDER — ATORVASTATIN CALCIUM 20 MG PO TABS: 20.0000 mg | ORAL_TABLET | Freq: Every day | ORAL | 1 refills | Status: DC

## 2019-10-06 MED ORDER — GLIPIZIDE ER 5 MG PO TB24: 5.0000 mg | ORAL_TABLET | Freq: Every day | ORAL | 1 refills | Status: DC

## 2019-10-06 NOTE — Telephone Encounter (Signed)
[name], swett are scheduled for a virtual visit with your provider today.    Just as we do with appointments in the office, we must obtain your consent to participate.  Your consent will be active for this visit and any virtual visit you may have with one of our providers in the next 365 days.    If you have a MyChart account, I can also send a copy of this consent to you electronically.  All virtual visits are billed to your insurance company just like a traditional visit in the office.  As this is a virtual visit, video technology does not allow for your provider to perform a traditional examination.  This may limit your provider's ability to fully assess your condition.  If your provider identifies any concerns that need to be evaluated in person or the need to arrange testing such as labs, EKG, etc, we will make arrangements to do so.    Although advances in technology are sophisticated, we cannot ensure that it will always work on either your end or our end.  If the connection with a video visit is poor, we may have to switch to a telephone visit.  With either a video or telephone visit, we are not always able to ensure that we have a secure connection.   I need to obtain your verbal consent now.   Are you willing to proceed with your visit today?   Paul Barnes has provided verbal consent on 10/06/2019 for a virtual visit (video or telephone).   Paul Na, RN 10/06/2019  8:41 AM

## 2019-10-06 NOTE — Progress Notes (Signed)
   Subjective:    Patient ID: Paul Barnes, male    DOB: 07/11/1965, 54 y.o.   MRN: [id number]  HPI  Patient calls for a follow up on ADHD medication. Patient is currently on Concerta 18 mg and states he is doing well.  Virtual Visit via Video Note  I connected with Paul Barnes on 10/06/19 at  8:40 AM EDT by a video enabled telemedicine application and verified that I am speaking with the correct person using two identifiers.  Location: Patient: home Provider: office   I discussed the limitations of evaluation and management by telemedicine and the availability of in person appointments. The patient expressed understanding and agreed to proceed.  History of Present Illness:    Observations/Objective:   Assessment and Plan:   Follow Up Instructions:    I discussed the assessment and treatment plan with the patient. The patient was provided an opportunity to ask questions and all were answered. The patient agreed with the plan and demonstrated an understanding of the instructions.   The patient was advised to call back or seek an in-person evaluation if the symptoms worsen or if the condition fails to improve as anticipated.  I provided 25 minutes of non-face-to-face time during this encounter.    Results for orders placed or performed in visit on 06/09/19  Novel Coronavirus, NAA ([facility])   Specimen: Nasopharyngeal(NP) swabs in vial transport medium   NASOPHARYNGE  TESTING  Result Value Ref Range   SARS-CoV-2, NAA Detected (A) Not Detected    Patient claims compliance with diabetes medication. No obvious side effects. Reports no substantial low sugar spells. Most numbers are generally in good range when checked fasting. Generally does not miss a dose of medication. Watching diabetic diet closely Patient notes most morning sugars between 130 and 170  He has been using his glipizide basically as needed because of low sugar spells  States the ADHD medication  definitely helping  Blood work reviewed today Review of Systems No headache no chest pain no shortness of breath    Objective:   Physical Exam   Virtual     Assessment & Plan:  Impression 1 ADHD.  Overall good control discussed 3 months worth of meds written  2.  Type 2 diabetes.  Sugars suboptimal.  Unfortunately no A1c done we will do one at the follow-up face-to-face visit in 3 months.  Stop Glucotrol, which patient is using as needed anyway.  Start Glucotrol 5 XL every morning take faithfully in addition to Metformin  3.  Hyperlipidemia.  Discussed.  Encouraged initiating statins.  Will start Lipitor 20 nightly.  Follow-up in 3 months diet exercise discussed

## 2020-02-20 ENCOUNTER — Other Ambulatory Visit: Payer: Self-pay | Admitting: Orthopedic Surgery

## 2020-02-20 DIAGNOSIS — Z9889 Other specified postprocedural states: Secondary | ICD-10-CM

## 2020-02-20 DIAGNOSIS — M1712 Unilateral primary osteoarthritis, left knee: Secondary | ICD-10-CM

## 2020-02-21 NOTE — Telephone Encounter (Signed)
Rx request 
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
## 2020-07-05 ENCOUNTER — Telehealth: Payer: Self-pay | Admitting: Family Medicine

## 2020-07-05 NOTE — Telephone Encounter (Signed)
Please call pt and schedule appt with dr Lovena Le unless he has established care with another provider

## 2020-08-16 ENCOUNTER — Ambulatory Visit: Payer: BC Managed Care – PPO | Admitting: Family Medicine

## 2020-08-17 ENCOUNTER — Other Ambulatory Visit: Payer: Self-pay | Admitting: Family Medicine

## 2020-08-17 ENCOUNTER — Ambulatory Visit (INDEPENDENT_AMBULATORY_CARE_PROVIDER_SITE_OTHER): Payer: BC Managed Care – PPO | Admitting: Family Medicine

## 2020-08-17 ENCOUNTER — Other Ambulatory Visit: Payer: Self-pay

## 2020-08-17 VITALS — BP 120/78 | HR 94 | Temp 98.6°F | Ht 74.0 in | Wt 244.0 lb

## 2020-08-17 DIAGNOSIS — E119 Type 2 diabetes mellitus without complications: Secondary | ICD-10-CM | POA: Diagnosis not present

## 2020-08-17 DIAGNOSIS — Z125 Encounter for screening for malignant neoplasm of prostate: Secondary | ICD-10-CM | POA: Diagnosis not present

## 2020-08-17 DIAGNOSIS — E785 Hyperlipidemia, unspecified: Secondary | ICD-10-CM | POA: Diagnosis not present

## 2020-08-17 DIAGNOSIS — M25569 Pain in unspecified knee: Secondary | ICD-10-CM | POA: Diagnosis not present

## 2020-08-17 DIAGNOSIS — G8929 Other chronic pain: Secondary | ICD-10-CM

## 2020-08-17 MED ORDER — METFORMIN HCL 500 MG PO TABS: 1000.0000 mg | ORAL_TABLET | Freq: Two times a day (BID) | ORAL | 0 refills | Status: DC

## 2020-08-17 MED ORDER — ATORVASTATIN CALCIUM 20 MG PO TABS: 20.0000 mg | ORAL_TABLET | Freq: Every day | ORAL | 0 refills | Status: DC

## 2020-08-17 MED ORDER — GLIPIZIDE ER 5 MG PO TB24: 5.0000 mg | ORAL_TABLET | Freq: Every day | ORAL | 0 refills | Status: DC
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
## 2020-09-01 ENCOUNTER — Telehealth: Payer: Self-pay | Admitting: Family Medicine

## 2020-09-01 ENCOUNTER — Encounter: Payer: Self-pay | Admitting: Family Medicine

## 2020-09-01 NOTE — Telephone Encounter (Signed)
Left message to return call 

## 2020-09-04 ENCOUNTER — Other Ambulatory Visit: Payer: Self-pay | Admitting: *Deleted

## 2020-09-04 DIAGNOSIS — Z125 Encounter for screening for malignant neoplasm of prostate: Secondary | ICD-10-CM

## 2020-09-04 NOTE — Telephone Encounter (Signed)
Patient states he was out of town last week but plans on having fasting labs drawn in the am

## 2020-09-05 DIAGNOSIS — E785 Hyperlipidemia, unspecified: Secondary | ICD-10-CM | POA: Diagnosis not present

## 2020-09-05 DIAGNOSIS — Z125 Encounter for screening for malignant neoplasm of prostate: Secondary | ICD-10-CM | POA: Diagnosis not present

## 2020-09-05 DIAGNOSIS — E119 Type 2 diabetes mellitus without complications: Secondary | ICD-10-CM | POA: Diagnosis not present

## 2020-09-06 ENCOUNTER — Telehealth: Payer: Self-pay | Admitting: Family Medicine

## 2020-09-06 DIAGNOSIS — E119 Type 2 diabetes mellitus without complications: Secondary | ICD-10-CM

## 2020-09-06 DIAGNOSIS — E785 Hyperlipidemia, unspecified: Secondary | ICD-10-CM

## 2020-09-06 DIAGNOSIS — Z79899 Other long term (current) drug therapy: Secondary | ICD-10-CM

## 2020-09-06 LAB — CMP14+EGFR
ALT: 18 IU/L (ref 0–44)
AST: 12 IU/L (ref 0–40)
Albumin/Globulin Ratio: 2 (ref 1.2–2.2)
Albumin: 4.5 g/dL (ref 3.8–4.9)
Alkaline Phosphatase: 101 IU/L (ref 44–121)
BUN/Creatinine Ratio: 17 (ref 9–20)
BUN: 16 mg/dL (ref 6–24)
Bilirubin Total: 0.7 mg/dL (ref 0.0–1.2)
CO2: 22 mmol/L (ref 20–29)
Calcium: 9.4 mg/dL (ref 8.7–10.2)
Chloride: 103 mmol/L (ref 96–106)
Creatinine, Ser: 0.96 mg/dL (ref 0.76–1.27)
Globulin, Total: 2.3 g/dL (ref 1.5–4.5)
Glucose: 199 mg/dL — ABNORMAL HIGH (ref 65–99)
Potassium: 4.7 mmol/L (ref 3.5–5.2)
Sodium: 141 mmol/L (ref 134–144)
Total Protein: 6.8 g/dL (ref 6.0–8.5)
eGFR: 93 mL/min/{1.73_m2} (ref >59)

## 2020-09-06 LAB — CBC
Hematocrit: 43.8 % (ref 37.5–51.0)
Hemoglobin: 15.1 g/dL (ref 13.0–17.7)
MCH: 31.3 pg (ref 26.6–33.0)
MCHC: 34.5 g/dL (ref 31.5–35.7)
MCV: 91 fL (ref 79–97)
Platelets: 254 10*3/uL (ref 150–450)
RBC: 4.83 x10E6/uL (ref 4.14–5.80)
RDW: 11.8 % (ref 11.6–15.4)
WBC: 4.9 10*3/uL (ref 3.4–10.8)

## 2020-09-06 LAB — LIPID PANEL
Chol/HDL Ratio: 5.1 ratio — ABNORMAL HIGH (ref 0.0–5.0)
Cholesterol, Total: 236 mg/dL — ABNORMAL HIGH (ref 100–199)
HDL: 46 mg/dL (ref >39)
LDL Chol Calc (NIH): 159 mg/dL — ABNORMAL HIGH (ref 0–99)
Triglycerides: 170 mg/dL — ABNORMAL HIGH (ref 0–149)
VLDL Cholesterol Cal: 31 mg/dL (ref 5–40)

## 2020-09-06 LAB — PSA: Prostate Specific Ag, Serum: 0.7 ng/mL (ref 0.0–4.0)

## 2020-09-06 LAB — HEMOGLOBIN A1C
Est. average glucose Bld gHb Est-mCnc: 240 mg/dL
Hgb A1c MFr Bld: 10 % — ABNORMAL HIGH (ref 4.8–5.6)

## 2020-09-06 LAB — MICROALBUMIN, URINE: Microalbumin, Urine: 10.2 ug/mL

## 2020-09-06 NOTE — Telephone Encounter (Signed)
Lab orders placed. Left message to return call. Will mail lab papers to patient.

## 2020-09-28 ENCOUNTER — Encounter: Payer: Self-pay | Admitting: Gastroenterology

## 2020-10-05 ENCOUNTER — Ambulatory Visit (INDEPENDENT_AMBULATORY_CARE_PROVIDER_SITE_OTHER): Payer: BC Managed Care – PPO | Admitting: Orthopedic Surgery

## 2020-10-05 ENCOUNTER — Encounter: Payer: Self-pay | Admitting: Orthopedic Surgery

## 2020-10-05 ENCOUNTER — Telehealth: Payer: Self-pay | Admitting: Radiology

## 2020-10-05 ENCOUNTER — Other Ambulatory Visit: Payer: Self-pay

## 2020-10-05 ENCOUNTER — Ambulatory Visit: Payer: BC Managed Care – PPO

## 2020-10-05 VITALS — BP 144/104 | HR 88 | Ht 74.0 in | Wt 220.0 lb

## 2020-10-05 DIAGNOSIS — G8929 Other chronic pain: Secondary | ICD-10-CM

## 2020-10-05 DIAGNOSIS — M1711 Unilateral primary osteoarthritis, right knee: Secondary | ICD-10-CM

## 2020-10-05 DIAGNOSIS — M1712 Unilateral primary osteoarthritis, left knee: Secondary | ICD-10-CM

## 2020-10-05 DIAGNOSIS — M25562 Pain in left knee: Secondary | ICD-10-CM

## 2020-10-05 DIAGNOSIS — M170 Bilateral primary osteoarthritis of knee: Secondary | ICD-10-CM

## 2020-10-05 DIAGNOSIS — M25561 Pain in right knee: Secondary | ICD-10-CM

## 2020-10-05 NOTE — Telephone Encounter (Signed)
Yes, is bilateral knees thanks

## 2020-10-05 NOTE — Telephone Encounter (Signed)
Bilateral knees

## 2020-10-05 NOTE — Patient Instructions (Addendum)
HYALURONIC ACID   We will check which brand of Hyaluronic acid injections (there are several) your insurance covers and we will call you with price and schedule with you if insurance approves and you are okay with the out of pocket costs.

## 2020-10-05 NOTE — Telephone Encounter (Signed)
Dr Aline Brochure wants [place] to check on Hyaluronic acid injections for this patient, please.

## 2020-10-05 NOTE — Progress Notes (Signed)
Chief Complaint  Patient presents with  . Knee Pain    Both / getting worse    History 55 year old male status post arthroscopy left knee in 2019 evaluation for right knee pain back in July 2019  Paul Barnes is concerned because his knee pain is worsening over the last 2 years.  He says he cannot go without taking his diclofenac and although it helps it does not completely get rid of the pain  He is currently trying to run a auction business and he cannot do any heavy lifting which is concerning to him.  He complains of dull throbbing 8 diffusely about the knee which is worse the more he stands walks kneels or crawls  He does have some shortness of breath on activity related to his COVID illness  .pmh Past Surgical History:  Procedure Laterality Date  . KNEE ARTHROSCOPY WITH MEDIAL MENISECTOMY Right 02/11/2014   Procedure: KNEE ARTHROSCOPY WITH MEDIAL MENISECTOMY AND CHONDROPLASTY;  Surgeon: Carole Civil, MD;  Location: [facility];  Service: Orthopedics;  Laterality: Right;  . KNEE ARTHROSCOPY WITH MEDIAL MENISECTOMY Left 01/15/2018   Procedure: Chondroplasty medial  condyle  and trochlea left knee;  Surgeon: Carole Civil, MD;  Location: [facility];  Service: Orthopedics;  Laterality: Left;  . PLANTAR FASCIA RELEASE Right 11/09/2014   Procedure: ENDOSCOPIC PLANTAR FASCIOTOMY;  Surgeon: Caprice Beaver, DPM;  Location: [facility];  Service: Podiatry;  Laterality: Right;  . SHOULDER ARTHROSCOPY Right     BP (!) 144/104   Pulse 88   Ht 6\' 2"  (1.88 m)   Wt 220 lb (99.8 kg)   BMI 28.25 kg/m   He is awake alert and oriented x3  He walks without assistive devices.  He has bilateral lateral mild varus in both knees.  He has tenderness sort of diffusely about the knee he has a lot of crepitance in the left knee no effusion in either knee strength is normal and both ligaments are stable in both  Bilateral knee x-rays show mild narrowing of the medial compartment he does not have any  significant osteophytes however  His prior MRI in the left knee shows cartilage loss in the patella and the medial femoral condyle  We therefore know that his x-rays are not telling the whole story  He is getting close to the point where he is not having a good quality of life but we will order hyaluronic acid injections as a last resort to see if that can help him before we have to succumb to knee replacement surgery   Encounter Diagnoses  Name Primary?  . Chronic pain of both knees Yes  . Arthritis of right knee   . Arthritis of left knee     Plan continue diclofenac and order or at least try to get approval for Orthovisc

## 2020-10-05 NOTE — Telephone Encounter (Signed)
[place] preferred gel inj is Orthovisc and Synvisc One.    Submitted online [employer] for Orthovisc BV.

## 2020-10-09 NOTE — Telephone Encounter (Signed)
BV received.   Primary [place]BS [place]- covered, ok to buy and bill, no PA needed, OV does not have to be billed (if billed, then 10% coins), 10% product copay, 10% admin copay.  $201.59 of $800 deductible met.  Once deductible met, patient responsible for a coins.    Secondary [place]- covered with restrictions, ok to buy and bill, PA is required.  Office visit is required to be billed.  $40 copay for OV.  30% product copay, and 0% admin copay. To initiate PA call [phone].  Also will look at faxing in the [facility] PA forms we have.

## 2020-10-11 NOTE — Telephone Encounter (Signed)
No PA needed for secondary BCBS plan, as they are second they do not require.

## 2020-10-12 NOTE — Telephone Encounter (Signed)
Patient called back, I advised of benefits.  He will think on it and call us back if he wishes to schedule Orthovisc Bil knees.

## 2020-10-12 NOTE — Telephone Encounter (Signed)
I called, LM to call me back to discuss benefits.

## 2021-01-15 ENCOUNTER — Other Ambulatory Visit: Payer: Self-pay | Admitting: Orthopedic Surgery

## 2021-01-15 DIAGNOSIS — M1712 Unilateral primary osteoarthritis, left knee: Secondary | ICD-10-CM

## 2021-01-15 DIAGNOSIS — Z9889 Other specified postprocedural states: Secondary | ICD-10-CM

## 2021-07-08 ENCOUNTER — Other Ambulatory Visit: Payer: Self-pay | Admitting: Family Medicine

## 2021-07-09 NOTE — Telephone Encounter (Signed)
Sent message on 07/09/21

## 2021-07-11 NOTE — Telephone Encounter (Signed)
Left message to schedule follow up 07/11/21 and sent second my chart message also

## 2021-07-19 ENCOUNTER — Encounter: Payer: Self-pay | Admitting: Family Medicine

## 2021-07-19 ENCOUNTER — Other Ambulatory Visit: Payer: Self-pay

## 2021-07-19 ENCOUNTER — Ambulatory Visit (INDEPENDENT_AMBULATORY_CARE_PROVIDER_SITE_OTHER): Payer: BC Managed Care – PPO | Admitting: Family Medicine

## 2021-07-19 VITALS — BP 119/84 | HR 65 | Temp 97.7°F | Wt 250.0 lb

## 2021-07-19 DIAGNOSIS — Z130 Encounter for screening for diseases of the blood and blood-forming organs and certain disorders involving the immune mechanism: Secondary | ICD-10-CM | POA: Diagnosis not present

## 2021-07-19 DIAGNOSIS — N529 Male erectile dysfunction, unspecified: Secondary | ICD-10-CM

## 2021-07-19 DIAGNOSIS — E785 Hyperlipidemia, unspecified: Secondary | ICD-10-CM | POA: Insufficient documentation

## 2021-07-19 DIAGNOSIS — M170 Bilateral primary osteoarthritis of knee: Secondary | ICD-10-CM

## 2021-07-19 DIAGNOSIS — E782 Mixed hyperlipidemia: Secondary | ICD-10-CM

## 2021-07-19 DIAGNOSIS — E1165 Type 2 diabetes mellitus with hyperglycemia: Secondary | ICD-10-CM | POA: Diagnosis not present

## 2021-07-19 DIAGNOSIS — K429 Umbilical hernia without obstruction or gangrene: Secondary | ICD-10-CM | POA: Insufficient documentation

## 2021-07-19 DIAGNOSIS — Z125 Encounter for screening for malignant neoplasm of prostate: Secondary | ICD-10-CM

## 2021-07-19 MED ORDER — ACCU-CHEK SOFTCLIX LANCETS MISC: 5 refills | Status: DC

## 2021-07-19 MED ORDER — DICLOFENAC SODIUM 75 MG PO TBEC: DELAYED_RELEASE_TABLET | ORAL | 5 refills | Status: DC

## 2021-07-19 MED ORDER — TADALAFIL 10 MG PO TABS: 10.0000 mg | ORAL_TABLET | Freq: Every day | ORAL | 1 refills | Status: DC | PRN

## 2021-07-19 MED ORDER — ATORVASTATIN CALCIUM 20 MG PO TABS: ORAL_TABLET | ORAL | 0 refills | Status: DC

## 2021-07-19 MED ORDER — METFORMIN HCL 1000 MG PO TABS: 1000.0000 mg | ORAL_TABLET | Freq: Two times a day (BID) | ORAL | 0 refills | Status: DC

## 2021-07-19 MED ORDER — GLIPIZIDE ER 5 MG PO TB24: 5.0000 mg | ORAL_TABLET | Freq: Every day | ORAL | 0 refills | Status: DC

## 2021-07-19 MED ORDER — ACCU-CHEK GUIDE VI STRP: ORAL_STRIP | 5 refills | Status: DC

## 2021-07-19 NOTE — Patient Instructions (Signed)
Labs today.  I have refilled your medications.  I will refer to Ortho.  Take care  Dr. Lacinda Axon

## 2021-07-19 NOTE — Assessment & Plan Note (Signed)
Unsure of control.  Labs today.  Continue atorvastatin at current dosing.  May need dose increase pending labs.

## 2021-07-19 NOTE — Assessment & Plan Note (Signed)
Trial of Cialis.

## 2021-07-19 NOTE — Progress Notes (Signed)
Subjective:  Patient ID: Paul Barnes, male    DOB: 07-01-1965  Age: 56 y.o. MRN: [id number]  CC: Chief Complaint  Patient presents with   Establish Care    HPI:  56 year old male with type 2 diabetes, hyperlipidemia, osteoarthritis, rectal dysfunction presents for follow-up and to establish care with me.  Patient has not been seen for follow-up in nearly a year.  No recent labs.  He has not been checking his blood sugar as he has ran out of test trips.  He is currently on metformin and glipizide.  Last available A1c was 10.  Patient states that he had a lot of personal issues over the past few years.  He states that these have contributed to his lack of follow-up and not taking his good care of his self as he should.  Patient's hyperlipidemia has been uncontrolled as well.  He is reportedly taking Lipitor as prescribed.  He is on Lipitor 20 mg daily.  Patient states that he needs refills on his medication.  He would like to try different agent regarding erectile dysfunction.  Additionally, patient has previously seen orthopedics for his knee osteoarthritis.  He would like to see Dr. Ronnie Derby.  Patient Active Problem List   Diagnosis Date Noted   Uncontrolled type 2 diabetes mellitus with hyperglycemia ([place]) 07/19/2021   Hyperlipidemia 07/19/2021   Erectile dysfunction 07/19/2021   Osteoarthritis of both knees 29/24/4628   Umbilical hernia 63/81/7711   S/P left knee arthroscopy 01/15/18  01/22/2018    Social Hx   Social History   Socioeconomic History   Marital status: Married    Spouse name: Not on file   Number of children: Not on file   Years of education: Not on file   Highest education level: Not on file  Occupational History   Occupation: Dealer  Tobacco Use   Smoking status: Never   Smokeless tobacco: Former    Types: Chew    Quit date: 01/08/2017  Vaping Use   Vaping Use: Never used  Substance and Sexual Activity   Alcohol use: Yes    Comment:  occassional   Drug use: No   Sexual activity: Yes    Birth control/protection: None  Other Topics Concern   Not on file  Social History Narrative   Not on file   Social Determinants of Health   Financial Resource Strain: Not on file  Food Insecurity: Not on file  Transportation Needs: Not on file  Physical Activity: Not on file  Stress: Not on file  Social Connections: Not on file    Review of Systems  Constitutional: Negative.   Musculoskeletal:        Knee pain.   Objective:  BP 119/84    Pulse 65    Temp 97.7 F (36.5 C)    Wt 250 lb (113.4 kg)    SpO2 97%    BMI 32.10 kg/m   BP/Weight 07/19/2021 10/05/2020 6/57/9038  Systolic BP 333 832 919  Diastolic BP 84 166 78  Wt. (Lbs) 250 220 244  BMI 32.1 28.25 31.33    Physical Exam Constitutional:      General: He is not in acute distress.    Appearance: Normal appearance. He is not ill-appearing.  HENT:     Head: Normocephalic and atraumatic.  Eyes:     General:        Right eye: No discharge.        Left eye: No discharge.  Conjunctiva/sclera: Conjunctivae normal.  Cardiovascular:     Rate and Rhythm: Normal rate and regular rhythm.  Pulmonary:     Effort: Pulmonary effort is normal.     Breath sounds: Normal breath sounds. No wheezing, rhonchi or rales.  Abdominal:     Comments: Umbilical hernia.   Neurological:     Mental Status: He is alert.  Psychiatric:        Mood and Affect: Mood normal.        Behavior: Behavior normal.    Lab Results  Component Value Date   WBC 4.9 09/05/2020   HGB 15.1 09/05/2020   HCT 43.8 09/05/2020   PLT 254 09/05/2020   GLUCOSE 199 (H) 09/05/2020   CHOL 236 (H) 09/05/2020   TRIG 170 (H) 09/05/2020   HDL 46 09/05/2020   LDLCALC 159 (H) 09/05/2020   ALT 18 09/05/2020   AST 12 09/05/2020   NA 141 09/05/2020   K 4.7 09/05/2020   CL 103 09/05/2020   CREATININE 0.96 09/05/2020   BUN 16 09/05/2020   CO2 22 09/05/2020   HGBA1C 10.0 (H) 09/05/2020     Assessment  & Plan:   Problem List Items Addressed This Visit       Endocrine   Uncontrolled type 2 diabetes mellitus with hyperglycemia ([place]) - Primary    Unsure of control.  However has been quite uncontrolled.  Awaiting laboratory studies.  Continue glipizide and metformin for now.      Relevant Medications   glipiZIDE (GLUCOTROL XL) 5 MG 24 hr tablet   atorvastatin (LIPITOR) 20 MG tablet   metFORMIN (GLUCOPHAGE) 1000 MG tablet   Other Relevant Orders   CMP14+EGFR   Hemoglobin A1c     Musculoskeletal and Integument   Osteoarthritis of both knees    Referring to orthopedics Dr. Ronnie Derby.      Relevant Medications   diclofenac (VOLTAREN) 75 MG EC tablet   Other Relevant Orders   Ambulatory referral to Orthopedic Surgery     Other   Hyperlipidemia    Unsure of control.  Labs today.  Continue atorvastatin at current dosing.  May need dose increase pending labs.      Relevant Medications   atorvastatin (LIPITOR) 20 MG tablet   tadalafil (CIALIS) 10 MG tablet   Other Relevant Orders   Lipid panel   Erectile dysfunction    Trial of Cialis.      Other Visit Diagnoses     Screening for deficiency anemia       Relevant Orders   CBC   Prostate cancer screening       Relevant Orders   PSA       Meds ordered this encounter  Medications   glipiZIDE (GLUCOTROL XL) 5 MG 24 hr tablet    Sig: Take 1 tablet (5 mg total) by mouth daily with breakfast.    Dispense:  90 tablet    Refill:  0   diclofenac (VOLTAREN) 75 MG EC tablet    Sig: TAKE 1 TABLET(75 MG) BY MOUTH TWICE DAILY WITH A MEAL    Dispense:  60 tablet    Refill:  5   atorvastatin (LIPITOR) 20 MG tablet    Sig: 1 tablet daily.    Dispense:  90 tablet    Refill:  0   tadalafil (CIALIS) 10 MG tablet    Sig: Take 1 tablet (10 mg total) by mouth daily as needed for erectile dysfunction.    Dispense:  10 tablet  Refill:  1   metFORMIN (GLUCOPHAGE) 1000 MG tablet    Sig: Take 1 tablet (1,000 mg total) by mouth 2 (two)  times daily with a meal.    Dispense:  180 tablet    Refill:  0   glucose blood (ACCU-CHEK GUIDE) test strip    Sig: USE TO TEST BLOOD SUGAR EVERY DAY.    Dispense:  100 each    Refill:  5    DX: E 11.65   Accu-Chek Softclix Lancets lancets    Sig: Use to check sugars every day    Dispense:  100 each    Refill:  5    DX: E11.65    Follow-up:  Return in about 3 months (around 10/16/2021).  [place]

## 2021-07-19 NOTE — Assessment & Plan Note (Signed)
Unsure of control.  However has been quite uncontrolled.  Awaiting laboratory studies.  Continue glipizide and metformin for now.

## 2021-07-19 NOTE — Assessment & Plan Note (Signed)
Referring to orthopedics Dr. Ronnie Derby.

## 2021-07-20 ENCOUNTER — Other Ambulatory Visit: Payer: Self-pay | Admitting: *Deleted

## 2021-07-20 DIAGNOSIS — E1165 Type 2 diabetes mellitus with hyperglycemia: Secondary | ICD-10-CM

## 2021-07-20 LAB — CBC
Hematocrit: 46.1 % (ref 37.5–51.0)
Hemoglobin: 15.7 g/dL (ref 13.0–17.7)
MCH: 31 pg (ref 26.6–33.0)
MCHC: 34.1 g/dL (ref 31.5–35.7)
MCV: 91 fL (ref 79–97)
Platelets: 255 10*3/uL (ref 150–450)
RBC: 5.06 x10E6/uL (ref 4.14–5.80)
RDW: 12.5 % (ref 11.6–15.4)
WBC: 5.8 10*3/uL (ref 3.4–10.8)

## 2021-07-20 LAB — LIPID PANEL
Chol/HDL Ratio: 5.5 ratio — ABNORMAL HIGH (ref 0.0–5.0)
Cholesterol, Total: 247 mg/dL — ABNORMAL HIGH (ref 100–199)
HDL: 45 mg/dL (ref >39)
LDL Chol Calc (NIH): 153 mg/dL — ABNORMAL HIGH (ref 0–99)
Triglycerides: 267 mg/dL — ABNORMAL HIGH (ref 0–149)
VLDL Cholesterol Cal: 49 mg/dL — ABNORMAL HIGH (ref 5–40)

## 2021-07-20 LAB — CMP14+EGFR
ALT: 14 IU/L (ref 0–44)
AST: 11 IU/L (ref 0–40)
Albumin/Globulin Ratio: 2.1 (ref 1.2–2.2)
Albumin: 4.5 g/dL (ref 3.8–4.9)
Alkaline Phosphatase: 100 IU/L (ref 44–121)
BUN/Creatinine Ratio: 16 (ref 9–20)
BUN: 17 mg/dL (ref 6–24)
Bilirubin Total: 0.8 mg/dL (ref 0.0–1.2)
CO2: 22 mmol/L (ref 20–29)
Calcium: 9.3 mg/dL (ref 8.7–10.2)
Chloride: 100 mmol/L (ref 96–106)
Creatinine, Ser: 1.06 mg/dL (ref 0.76–1.27)
Globulin, Total: 2.1 g/dL (ref 1.5–4.5)
Glucose: 288 mg/dL — ABNORMAL HIGH (ref 70–99)
Potassium: 4.4 mmol/L (ref 3.5–5.2)
Sodium: 136 mmol/L (ref 134–144)
Total Protein: 6.6 g/dL (ref 6.0–8.5)
eGFR: 82 mL/min/{1.73_m2} (ref >59)

## 2021-07-20 LAB — PSA: Prostate Specific Ag, Serum: 0.6 ng/mL (ref 0.0–4.0)

## 2021-07-20 LAB — HEMOGLOBIN A1C
Est. average glucose Bld gHb Est-mCnc: 266 mg/dL
Hgb A1c MFr Bld: 10.9 % — ABNORMAL HIGH (ref 4.8–5.6)

## 2021-07-23 NOTE — Telephone Encounter (Signed)
Had appointment on 07/19/21

## 2021-08-16 DIAGNOSIS — M170 Bilateral primary osteoarthritis of knee: Secondary | ICD-10-CM | POA: Diagnosis not present

## 2021-08-27 ENCOUNTER — Encounter: Payer: Self-pay | Admitting: Nurse Practitioner

## 2021-08-27 ENCOUNTER — Other Ambulatory Visit: Payer: Self-pay

## 2021-08-27 ENCOUNTER — Ambulatory Visit (INDEPENDENT_AMBULATORY_CARE_PROVIDER_SITE_OTHER): Payer: BC Managed Care – PPO | Admitting: Nurse Practitioner

## 2021-08-27 VITALS — BP 119/82 | HR 74 | Ht 74.0 in | Wt 250.0 lb

## 2021-08-27 DIAGNOSIS — E782 Mixed hyperlipidemia: Secondary | ICD-10-CM

## 2021-08-27 DIAGNOSIS — E1165 Type 2 diabetes mellitus with hyperglycemia: Secondary | ICD-10-CM | POA: Diagnosis not present

## 2021-08-27 DIAGNOSIS — I10 Essential (primary) hypertension: Secondary | ICD-10-CM

## 2021-08-27 NOTE — Patient Instructions (Signed)

## 2021-08-27 NOTE — Progress Notes (Signed)
? ?                                                    Endocrinology Consult Note  ?     08/27/2021, 11:50 AM ? ? ?Subjective:  ? ? Patient ID: Paul Barnes, male    DOB: 1965/08/19.  ?DOMINQUE Barnes is being seen in consultation for management of currently uncontrolled symptomatic diabetes requested by  Coral Spikes, DO. ? ? ?Past Medical History:  ?Diagnosis Date  ? Allergic rhinitis   ? Allergy   ? Arthritis   ? Diabetes mellitus without complication ([place])   ? History of kidney stones   ? Hyperlipidemia   ? not on meds  ? ? ?Past Surgical History:  ?Procedure Laterality Date  ? KNEE ARTHROSCOPY WITH MEDIAL MENISECTOMY Right 02/11/2014  ? Procedure: KNEE ARTHROSCOPY WITH MEDIAL MENISECTOMY AND CHONDROPLASTY;  Surgeon: Carole Civil, MD;  Location: [facility];  Service: Orthopedics;  Laterality: Right;  ? KNEE ARTHROSCOPY WITH MEDIAL MENISECTOMY Left 01/15/2018  ? Procedure: Chondroplasty medial  condyle  and trochlea left knee;  Surgeon: Carole Civil, MD;  Location: [facility];  Service: Orthopedics;  Laterality: Left;  ? PLANTAR FASCIA RELEASE Right 11/09/2014  ? Procedure: ENDOSCOPIC PLANTAR FASCIOTOMY;  Surgeon: Caprice Beaver, DPM;  Location: [facility];  Service: Podiatry;  Laterality: Right;  ? SHOULDER ARTHROSCOPY Right   ? ? ?Social History  ? ?Socioeconomic History  ? Marital status: Married  ?  Spouse name: Not on file  ? Number of children: Not on file  ? Years of education: Not on file  ? Highest education level: Not on file  ?Occupational History  ? Occupation: Dealer  ?Tobacco Use  ? Smoking status: Never  ? Smokeless tobacco: Former  ?  Types: Chew  ?  Quit date: 01/08/2017  ?Vaping Use  ? Vaping Use: Never used  ?Substance and Sexual Activity  ? Alcohol use: Yes  ?  Comment: occassional  ? Drug use: No  ? Sexual activity: Yes  ?  Birth control/protection: None  ?Other Topics Concern  ? Not on file  ?Social History Narrative  ? Not on file  ? ?Social Determinants of Health   ? ?Financial Resource Strain: Not on file  ?Food Insecurity: Not on file  ?Transportation Needs: Not on file  ?Physical Activity: Not on file  ?Stress: Not on file  ?Social Connections: Not on file  ? ? ?Family History  ?Problem Relation Age of Onset  ? Heart disease Unknown   ? Cancer Unknown   ? Diabetes Unknown   ? Cancer Mother   ? Heart disease Father   ? Colon cancer Neg Hx   ? Colon polyps Neg Hx   ? Esophageal cancer Neg Hx   ? Stomach cancer Neg Hx   ? Rectal cancer Neg Hx   ? ? ?Outpatient Encounter Medications as of 08/27/2021  ?Medication Sig  ? Accu-Chek Softclix Lancets lancets Use to check sugars every day  ? albuterol (PROVENTIL HFA;VENTOLIN HFA) 108 (90 Base) MCG/ACT inhaler INHALE 2 PUFFS INTO THE LUNGS EVERY 6 HOURS AS NEEDED FOR WHEEZING OR SHORTNESS OF BREATH  ? atorvastatin (LIPITOR) 20 MG tablet 1 tablet daily.  ? diclofenac (VOLTAREN) 75 MG EC tablet TAKE 1 TABLET(75 MG) BY MOUTH TWICE DAILY WITH A MEAL  ?  glucose blood (ACCU-CHEK GUIDE) test strip USE TO TEST BLOOD SUGAR EVERY DAY.  ? Melatonin 5 MG TABS Take 5 mg by mouth at bedtime as needed (sleep).  ? metFORMIN (GLUCOPHAGE) 1000 MG tablet Take 1 tablet (1,000 mg total) by mouth 2 (two) times daily with a meal.  ? Multiple Vitamin (MULTIVITAMIN WITH MINERALS) TABS tablet Take 1 tablet by mouth daily.  ? tadalafil (CIALIS) 10 MG tablet Take 1 tablet (10 mg total) by mouth daily as needed for erectile dysfunction.  ? [DISCONTINUED] glipiZIDE (GLUCOTROL XL) 5 MG 24 hr tablet Take 1 tablet (5 mg total) by mouth daily with breakfast.  ? ?No facility-administered encounter medications on file as of 08/27/2021.  ? ? ?ALLERGIES: ?No Known Allergies ? ?VACCINATION STATUS: ? ?There is no immunization history on file for this patient. ? ?Diabetes ?He presents for his initial diabetic visit. He has type 2 diabetes mellitus. Onset time: Diagnosed at approx age of 39. His disease course has been fluctuating. Hypoglycemia symptoms include dizziness,  nervousness/anxiousness, sweats and tremors. Associated symptoms include fatigue and foot paresthesias. Pertinent negatives for diabetes include no blurred vision, no polydipsia and no polyuria. There are no hypoglycemic complications. Symptoms are stable. Diabetic complications include nephropathy. Risk factors for coronary artery disease include diabetes mellitus, dyslipidemia, hypertension and male sex. Current diabetic treatment includes oral agent (dual therapy) (has only been taking his Glipizide as needed). He is compliant with treatment most of the time. His weight is fluctuating minimally. He is following a generally unhealthy diet. When asked about meal planning, he reported none. He has not had a previous visit with a dietitian. He participates in exercise intermittently. His home blood glucose trend is decreasing steadily. (He presents today for his consultation with no meter or logs to review.  His most recent A1c was 10.9% on 07/19/21.  He monitors glucose once daily (ranging between 120-140).  He drinks water, diet sundrop, coffee, and tea and typically skips meals during the day.  He does not engage in any routine physical activity outside of his job duties.  He is due for eye exam and has seen podiatrist in the past.) An ACE inhibitor/angiotensin II receptor blocker is not being taken. He does not see a podiatrist.Eye exam is current.  ?Hyperlipidemia ?This is a chronic problem. The current episode started more than 1 year ago. The problem is uncontrolled. Recent lipid tests were reviewed and are high. Exacerbating diseases include chronic renal disease and diabetes. Factors aggravating his hyperlipidemia include fatty foods. Current antihyperlipidemic treatment includes statins. Compliance problems include adherence to diet, adherence to exercise and medication side effects (has myalgias with statin).  Risk factors for coronary artery disease include diabetes mellitus, dyslipidemia, family history,  male sex and hypertension.  ?Hypertension ?This is a chronic problem. The current episode started more than 1 year ago. The problem has been resolved since onset. The problem is controlled. Associated symptoms include sweats. Pertinent negatives include no blurred vision. There are no associated agents to hypertension. Risk factors for coronary artery disease include diabetes mellitus, dyslipidemia, family history and male gender. Past treatments include nothing. Compliance problems include diet and exercise.  Hypertensive end-organ damage includes kidney disease. Identifiable causes of hypertension include chronic renal disease.  ? ? ?Review of systems ? ?Constitutional: + Minimally fluctuating body weight, current Body mass index is 32.1 kg/m?., no fatigue, no subjective hyperthermia, no subjective hypothermia ?Eyes: no blurry vision, no xerophthalmia ?ENT: no sore throat, no nodules palpated in throat, no dysphagia/odynophagia,  no hoarseness ?Cardiovascular: no chest pain, no shortness of breath, no palpitations, no leg swelling ?Respiratory: no cough, no shortness of breath ?Gastrointestinal: no nausea/vomiting/diarrhea ?Musculoskeletal: + muscle/joint aches- myalgias from statin and bilateral knee pain ?Skin: no rashes, no hyperemia ?Neurological: no tremors, no numbness, no tingling, no dizziness, burning pain to bilateral feet ?Psychiatric: no depression, no anxiety ? ?Objective:  ?  ? ?BP 119/82   Pulse 74   Ht '6\' 2"'$  (1.88 m)   Wt 250 lb (113.4 kg)   BMI 32.10 kg/m?   ?Wt Readings from Last 3 Encounters:  ?08/27/21 250 lb (113.4 kg)  ?07/19/21 250 lb (113.4 kg)  ?10/05/20 220 lb (99.8 kg)  ?  ? ?BP Readings from Last 3 Encounters:  ?08/27/21 119/82  ?07/19/21 119/84  ?10/05/20 (!) 144/104  ?  ? ?Physical Exam- Limited ? ?Constitutional:  Body mass index is 32.1 kg/m?. , not in acute distress, normal state of mind ?Eyes:  EOMI, no exophthalmos ?Neck: Supple ?Cardiovascular: RRR, no murmurs, rubs, or  gallops, no edema ?Respiratory: Adequate breathing efforts, no crackles, rales, rhonchi, or wheezing ?Musculoskeletal: no gross deformities, strength intact in all four extremities, no gross restriction of joint mo

## 2021-09-10 ENCOUNTER — Ambulatory Visit (INDEPENDENT_AMBULATORY_CARE_PROVIDER_SITE_OTHER): Payer: BC Managed Care – PPO | Admitting: Nurse Practitioner

## 2021-09-10 ENCOUNTER — Encounter: Payer: Self-pay | Admitting: Nurse Practitioner

## 2021-09-10 VITALS — BP 130/82 | HR 72 | Ht 74.0 in | Wt 247.0 lb

## 2021-09-10 DIAGNOSIS — E782 Mixed hyperlipidemia: Secondary | ICD-10-CM | POA: Diagnosis not present

## 2021-09-10 DIAGNOSIS — E1165 Type 2 diabetes mellitus with hyperglycemia: Secondary | ICD-10-CM | POA: Diagnosis not present

## 2021-09-10 DIAGNOSIS — I10 Essential (primary) hypertension: Secondary | ICD-10-CM

## 2021-09-10 LAB — POCT UA - MICROALBUMIN
Albumin/Creatinine Ratio, Urine, POC: <30
Creatinine, POC: 300 mg/dL
Microalbumin Ur, POC: 30 mg/L

## 2021-09-10 MED ORDER — ACCU-CHEK GUIDE VI STRP: ORAL_STRIP | 5 refills | Status: DC

## 2021-09-10 NOTE — Patient Instructions (Signed)

## 2021-09-10 NOTE — Progress Notes (Signed)
? ?                                                    Endocrinology Follow Up Note  ?     09/10/2021, 11:58 AM ? ? ?Subjective:  ? ? Patient ID: Paul Barnes, male    DOB: March 31, 1966.  ?Paul Barnes is being seen in follow up after being seen in consultation for management of currently uncontrolled symptomatic diabetes requested by  Coral Spikes, DO. ? ? ?Past Medical History:  ?Diagnosis Date  ? Allergic rhinitis   ? Allergy   ? Arthritis   ? Diabetes mellitus without complication ([place])   ? History of kidney stones   ? Hyperlipidemia   ? not on meds  ? ? ?Past Surgical History:  ?Procedure Laterality Date  ? KNEE ARTHROSCOPY WITH MEDIAL MENISECTOMY Right 02/11/2014  ? Procedure: KNEE ARTHROSCOPY WITH MEDIAL MENISECTOMY AND CHONDROPLASTY;  Surgeon: Carole Civil, MD;  Location: [facility];  Service: Orthopedics;  Laterality: Right;  ? KNEE ARTHROSCOPY WITH MEDIAL MENISECTOMY Left 01/15/2018  ? Procedure: Chondroplasty medial  condyle  and trochlea left knee;  Surgeon: Carole Civil, MD;  Location: [facility];  Service: Orthopedics;  Laterality: Left;  ? PLANTAR FASCIA RELEASE Right 11/09/2014  ? Procedure: ENDOSCOPIC PLANTAR FASCIOTOMY;  Surgeon: Caprice Beaver, DPM;  Location: [facility];  Service: Podiatry;  Laterality: Right;  ? SHOULDER ARTHROSCOPY Right   ? ? ?Social History  ? ?Socioeconomic History  ? Marital status: Married  ?  Spouse name: Not on file  ? Number of children: Not on file  ? Years of education: Not on file  ? Highest education level: Not on file  ?Occupational History  ? Occupation: Dealer  ?Tobacco Use  ? Smoking status: Never  ? Smokeless tobacco: Former  ?  Types: Chew  ?  Quit date: 01/08/2017  ?Vaping Use  ? Vaping Use: Never used  ?Substance and Sexual Activity  ? Alcohol use: Yes  ?  Comment: occassional  ? Drug use: No  ? Sexual activity: Yes  ?  Birth control/protection: None  ?Other Topics Concern  ? Not on file  ?Social History Narrative  ? Not on file   ? ?Social Determinants of Health  ? ?Financial Resource Strain: Not on file  ?Food Insecurity: Not on file  ?Transportation Needs: Not on file  ?Physical Activity: Not on file  ?Stress: Not on file  ?Social Connections: Not on file  ? ? ?Family History  ?Problem Relation Age of Onset  ? Heart disease Unknown   ? Cancer Unknown   ? Diabetes Unknown   ? Cancer Mother   ? Heart disease Father   ? Colon cancer Neg Hx   ? Colon polyps Neg Hx   ? Esophageal cancer Neg Hx   ? Stomach cancer Neg Hx   ? Rectal cancer Neg Hx   ? ? ?Outpatient Encounter Medications as of 09/10/2021  ?Medication Sig  ? Accu-Chek Softclix Lancets lancets Use to check sugars every day  ? albuterol (PROVENTIL HFA;VENTOLIN HFA) 108 (90 Base) MCG/ACT inhaler INHALE 2 PUFFS INTO THE LUNGS EVERY 6 HOURS AS NEEDED FOR WHEEZING OR SHORTNESS OF BREATH  ? Cholecalciferol (VITAMIN D3) 25 MCG (1000 UT) CAPS Take 1,000 Units by mouth daily.  ? diclofenac (VOLTAREN) 75 MG EC  tablet TAKE 1 TABLET(75 MG) BY MOUTH TWICE DAILY WITH A MEAL  ? Melatonin 5 MG TABS Take 5 mg by mouth at bedtime as needed (sleep).  ? metFORMIN (GLUCOPHAGE) 1000 MG tablet Take 1 tablet (1,000 mg total) by mouth 2 (two) times daily with a meal.  ? Multiple Vitamin (MULTIVITAMIN WITH MINERALS) TABS tablet Take 1 tablet by mouth daily.  ? tadalafil (CIALIS) 10 MG tablet Take 1 tablet (10 mg total) by mouth daily as needed for erectile dysfunction.  ? [DISCONTINUED] glucose blood (ACCU-CHEK GUIDE) test strip USE TO TEST BLOOD SUGAR EVERY DAY.  ? atorvastatin (LIPITOR) 20 MG tablet 1 tablet daily. (Patient not taking: Reported on 09/10/2021)  ? glucose blood (ACCU-CHEK GUIDE) test strip USE TO TEST BLOOD SUGAR twice daily  ? ?No facility-administered encounter medications on file as of 09/10/2021.  ? ? ?ALLERGIES: ?No Known Allergies ? ?VACCINATION STATUS: ? ?There is no immunization history on file for this patient. ? ?Diabetes ?He presents for his follow-up diabetic visit. He has type 2  diabetes mellitus. Onset time: Diagnosed at approx age of 59. His disease course has been improving. There are no hypoglycemic associated symptoms. Associated symptoms include fatigue and foot paresthesias. Pertinent negatives for diabetes include no blurred vision, no polydipsia and no polyuria. There are no hypoglycemic complications. Symptoms are stable. Diabetic complications include nephropathy. Risk factors for coronary artery disease include diabetes mellitus, dyslipidemia, hypertension and male sex. Current diabetic treatment includes oral agent (monotherapy). He is compliant with treatment most of the time. His weight is fluctuating minimally. He is following a generally healthy diet. When asked about meal planning, he reported none. He has not had a previous visit with a dietitian. He participates in exercise intermittently. His home blood glucose trend is decreasing steadily. His breakfast blood glucose range is generally 130-140 mg/dl. His lunch blood glucose range is generally 140-180 mg/dl. His dinner blood glucose range is generally 140-180 mg/dl. His bedtime blood glucose range is generally 180-200 mg/dl. (He presents today with his logs, no meter, showing improving glycemic profile with slightly above target fasting readings and at goal postprandial readings.  He is not due for another A1c today.  He denies any hypoglycemia since stopping the Glipizide.  He is still figuring out what certain foods do to his glucose readings.) An ACE inhibitor/angiotensin II receptor blocker is not being taken. He does not see a podiatrist.Eye exam is current.  ?Hyperlipidemia ?This is a chronic problem. The current episode started more than 1 year ago. The problem is uncontrolled. Recent lipid tests were reviewed and are high. Exacerbating diseases include chronic renal disease and diabetes. Factors aggravating his hyperlipidemia include fatty foods. Current antihyperlipidemic treatment includes statins. Compliance  problems include adherence to diet, adherence to exercise and medication side effects (has myalgias with statin).  Risk factors for coronary artery disease include diabetes mellitus, dyslipidemia, family history, male sex and hypertension.  ?Hypertension ?This is a chronic problem. The current episode started more than 1 year ago. The problem has been resolved since onset. The problem is controlled. Pertinent negatives include no blurred vision. There are no associated agents to hypertension. Risk factors for coronary artery disease include diabetes mellitus, dyslipidemia, family history and male gender. Past treatments include nothing. Compliance problems include diet and exercise.  Hypertensive end-organ damage includes kidney disease. Identifiable causes of hypertension include chronic renal disease.  ? ? ?Review of systems ? ?Constitutional: + Minimally fluctuating body weight,  current Body mass index is 31.71 kg/m?[place] ,  no fatigue, no subjective hyperthermia, no subjective hypothermia ?Eyes: no blurry vision, no xerophthalmia ?ENT: no sore throat, no nodules palpated in throat, no dysphagia/odynophagia, no hoarseness ?Cardiovascular: no chest pain, no shortness of breath, no palpitations, no leg swelling ?Respiratory: no cough, no shortness of breath ?Gastrointestinal: no nausea/vomiting/diarrhea ?Musculoskeletal: no muscle/joint aches ?Skin: no rashes, no hyperemia ?Neurological: no tremors, no numbness, no tingling, no dizziness, + burning pain to bilateral feet ?Psychiatric: no depression, no anxiety ? ?Objective:  ?  ? ?BP 130/82   Pulse 72   Ht '6\' 2"'$  (1.88 m)   Wt 247 lb (112 kg)   SpO2 99%   BMI 31.71 kg/m?   ?Wt Readings from Last 3 Encounters:  ?09/10/21 247 lb (112 kg)  ?08/27/21 250 lb (113.4 kg)  ?07/19/21 250 lb (113.4 kg)  ?  ? ?BP Readings from Last 3 Encounters:  ?09/10/21 130/82  ?08/27/21 119/82  ?07/19/21 119/84  ?  ? ? ?Physical Exam- Limited ? ?Constitutional:  Body mass index is 31.71  kg/m?. , not in acute distress, normal state of mind ?Eyes:  EOMI, no exophthalmos ?Neck: Supple ?Cardiovascular: RRR, no murmurs, rubs, or gallops, no edema ?Respiratory: Adequate breathing efforts, no crackles,

## 2021-10-12 ENCOUNTER — Other Ambulatory Visit: Payer: Self-pay | Admitting: Family Medicine

## 2021-10-16 ENCOUNTER — Ambulatory Visit (INDEPENDENT_AMBULATORY_CARE_PROVIDER_SITE_OTHER): Payer: BC Managed Care – PPO | Admitting: Family Medicine

## 2021-10-16 VITALS — BP 118/77 | HR 72 | Temp 97.5°F | Ht 74.0 in | Wt 238.0 lb

## 2021-10-16 DIAGNOSIS — N529 Male erectile dysfunction, unspecified: Secondary | ICD-10-CM | POA: Diagnosis not present

## 2021-10-16 DIAGNOSIS — G4700 Insomnia, unspecified: Secondary | ICD-10-CM | POA: Insufficient documentation

## 2021-10-16 DIAGNOSIS — E1165 Type 2 diabetes mellitus with hyperglycemia: Secondary | ICD-10-CM | POA: Diagnosis not present

## 2021-10-16 DIAGNOSIS — E782 Mixed hyperlipidemia: Secondary | ICD-10-CM | POA: Diagnosis not present

## 2021-10-16 DIAGNOSIS — F5101 Primary insomnia: Secondary | ICD-10-CM

## 2021-10-16 DIAGNOSIS — M170 Bilateral primary osteoarthritis of knee: Secondary | ICD-10-CM

## 2021-10-16 MED ORDER — DICLOFENAC SODIUM 75 MG PO TBEC: DELAYED_RELEASE_TABLET | ORAL | 5 refills | Status: DC

## 2021-10-16 MED ORDER — TRAZODONE HCL 50 MG PO TABS: 50.0000 mg | ORAL_TABLET | Freq: Every evening | ORAL | 1 refills | Status: DC | PRN

## 2021-10-16 MED ORDER — TADALAFIL 20 MG PO TABS: 20.0000 mg | ORAL_TABLET | Freq: Every day | ORAL | 3 refills | Status: DC | PRN

## 2021-10-16 NOTE — Assessment & Plan Note (Addendum)
Discussed referral to urology for possible injections.  Patient wants to try an increase in dose.  Increasing tadalafil to 20 mg. ?

## 2021-10-16 NOTE — Assessment & Plan Note (Signed)
Checking response to statin with lipid panel today.  Continue Lipitor. ?

## 2021-10-16 NOTE — Assessment & Plan Note (Signed)
Trial of trazodone. 

## 2021-10-16 NOTE — Assessment & Plan Note (Signed)
Diclofenac refilled.

## 2021-10-16 NOTE — Assessment & Plan Note (Signed)
A1c today to assess.  Continue metformin.  Will likely need GLP-1 if A1c continues to be uncontrolled. ?

## 2021-10-16 NOTE — Progress Notes (Signed)
? ?Subjective:  ?Patient ID: Paul Barnes, male    DOB: 09-10-1965  Age: 56 y.o. MRN: [id number] ? ?CC: ?Chief Complaint  ?Patient presents with  ? Diabetes  ? trouble staying asleep   ?  Fatigue chronic issue ?Seasonal nasal drianage  ? ? ?HPI: ? ?55 year old male with type 2 diabetes, osteoarthritis of both knees, erectile dysfunction, hyperlipidemia presents for follow-up. ? ?Patient has been following with endocrinology.  He has made dietary changes and is on metformin 1000 mg twice daily.  He states that his fasting blood sugar this morning was 160.  He is in need of A1c. ? ?Patient is on atorvastatin regarding hyperlipidemia.  Needs repeat lipid panel. ? ?Patient reports that he is still dealing with erectile dysfunction.  Has not responded to tadalafil.  Would like to discuss this today. ? ?Patient also reports ongoing issues with sleeping.  He reports that he has difficulty falling asleep but more so staying asleep.  He takes melatonin which seems to help him fall asleep but he does not stay asleep.  We will discuss treatment options today. ? ?Additionally, patient needs refill on diclofenac which he uses for osteoarthritis of the knees. ? ?Patient Active Problem List  ? Diagnosis Date Noted  ? Insomnia 10/16/2021  ? Uncontrolled type 2 diabetes mellitus with hyperglycemia ([place]) 07/19/2021  ? Hyperlipidemia 07/19/2021  ? Erectile dysfunction 07/19/2021  ? Osteoarthritis of both knees 07/19/2021  ? Umbilical hernia [id number]/41/9379  ? S/P left knee arthroscopy 01/15/18  01/22/2018  ? ? ?Social Hx   ?Social History  ? ?Socioeconomic History  ? Marital status: Married  ?  Spouse name: Not on file  ? Number of children: Not on file  ? Years of education: Not on file  ? Highest education level: Not on file  ?Occupational History  ? Occupation: Dealer  ?Tobacco Use  ? Smoking status: Never  ? Smokeless tobacco: Former  ?  Types: Chew  ?  Quit date: 01/08/2017  ?Vaping Use  ? Vaping Use: Never used  ?Substance and  Sexual Activity  ? Alcohol use: Yes  ?  Comment: occassional  ? Drug use: No  ? Sexual activity: Yes  ?  Birth control/protection: None  ?Other Topics Concern  ? Not on file  ?Social History Narrative  ? Not on file  ? ?Social Determinants of Health  ? ?Financial Resource Strain: Not on file  ?Food Insecurity: Not on file  ?Transportation Needs: Not on file  ?Physical Activity: Not on file  ?Stress: Not on file  ?Social Connections: Not on file  ? ? ?Review of Systems ?Per HPI ? ?Objective:  ?BP 118/77   Pulse 72   Temp (!) 97.5 ?F (36.4 ?C)   Ht '6\' 2"'$  (1.88 m)   Wt 238 lb (108 kg)   SpO2 97%   BMI 30.56 kg/m?  ? ? ?  10/16/2021  ? 10:06 AM 09/10/2021  ? 10:10 AM 08/27/2021  ? 10:41 AM  ?BP/Weight  ?Systolic BP 024 097 353  ?Diastolic BP 77 82 82  ?Wt. (Lbs) 238 247 250  ?BMI 30.56 kg/m2 31.71 kg/m2 32.1 kg/m2  ? ? ?Physical Exam ?Vitals and nursing note reviewed.  ?Constitutional:   ?   General: He is not in acute distress. ?   Appearance: Normal appearance. He is not ill-appearing.  ?HENT:  ?   Head: Normocephalic and atraumatic.  ?Eyes:  ?   General:     ?   Right eye: No discharge.     ?  Left eye: No discharge.  ?   Conjunctiva/sclera: Conjunctivae normal.  ?Cardiovascular:  ?   Rate and Rhythm: Normal rate and regular rhythm.  ?Pulmonary:  ?   Effort: Pulmonary effort is normal.  ?   Breath sounds: Normal breath sounds. No wheezing, rhonchi or rales.  ?Neurological:  ?   Mental Status: He is alert.  ?Psychiatric:     ?   Mood and Affect: Mood normal.     ?   Behavior: Behavior normal.  ? ? ?Lab Results  ?Component Value Date  ? WBC 5.8 07/19/2021  ? HGB 15.7 07/19/2021  ? HCT 46.1 07/19/2021  ? PLT 255 07/19/2021  ? GLUCOSE 288 (H) 07/19/2021  ? CHOL 247 (H) 07/19/2021  ? TRIG 267 (H) 07/19/2021  ? HDL 45 07/19/2021  ? LDLCALC 153 (H) 07/19/2021  ? ALT 14 07/19/2021  ? AST 11 07/19/2021  ? NA 136 07/19/2021  ? K 4.4 07/19/2021  ? CL 100 07/19/2021  ? CREATININE 1.06 07/19/2021  ? BUN 17 07/19/2021  ? CO2  22 07/19/2021  ? HGBA1C 10.9 (H) 07/19/2021  ? MICROALBUR 30 09/10/2021  ? ? ? ?Assessment & Plan:  ? ?Problem List Items Addressed This Visit   ? ?  ? Endocrine  ? Uncontrolled type 2 diabetes mellitus with hyperglycemia (HCC) - Primary  ?  A1c today to assess.  Continue metformin.  Will likely need GLP-1 if A1c continues to be uncontrolled. ? ?  ?  ? Relevant Orders  ? Hemoglobin A1c  ?  ? Musculoskeletal and Integument  ? Osteoarthritis of both knees  ?  Diclofenac refilled. ? ?  ?  ? Relevant Medications  ? diclofenac (VOLTAREN) 75 MG EC tablet  ?  ? Other  ? Erectile dysfunction  ?  Discussed referral to urology for possible injections.  Patient wants to try an increase in dose.  Increasing tadalafil to 20 mg. ? ?  ?  ? Hyperlipidemia  ?  Checking response to statin with lipid panel today.  Continue Lipitor. ? ?  ?  ? Relevant Medications  ? tadalafil (CIALIS) 20 MG tablet  ? Other Relevant Orders  ? Lipid panel  ? Insomnia  ?  Trial of trazodone. ? ?  ?  ? ? ?Meds ordered this encounter  ?Medications  ? diclofenac (VOLTAREN) 75 MG EC tablet  ?  Sig: TAKE 1 TABLET(75 MG) BY MOUTH TWICE DAILY WITH A MEAL  ?  Dispense:  60 tablet  ?  Refill:  5  ? tadalafil (CIALIS) 20 MG tablet  ?  Sig: Take 1 tablet (20 mg total) by mouth daily as needed for erectile dysfunction.  ?  Dispense:  30 tablet  ?  Refill:  3  ? traZODone (DESYREL) 50 MG tablet  ?  Sig: Take 1 tablet (50 mg total) by mouth at bedtime as needed for sleep.  ?  Dispense:  90 tablet  ?  Refill:  1  ? ? ?Follow-up:  Return in about 3 months (around 01/16/2022). ? ?Thersa Salt DO ?[place] ? ?

## 2021-10-16 NOTE — Patient Instructions (Signed)
Continue flonase. Add an antihistamine (Claritin, Zyrtec, Allegra, or Xyzal). ? ?Trazodone as needed for sleep. ? ?I have increased his Cialis. ? ?I have refilled your diclofenac. ? ?Labs today. ?

## 2021-10-17 LAB — LIPID PANEL
Chol/HDL Ratio: 5.1 ratio — ABNORMAL HIGH (ref 0.0–5.0)
Cholesterol, Total: 224 mg/dL — ABNORMAL HIGH (ref 100–199)
HDL: 44 mg/dL (ref >39)
LDL Chol Calc (NIH): 134 mg/dL — ABNORMAL HIGH (ref 0–99)
Triglycerides: 255 mg/dL — ABNORMAL HIGH (ref 0–149)
VLDL Cholesterol Cal: 46 mg/dL — ABNORMAL HIGH (ref 5–40)

## 2021-10-17 LAB — HEMOGLOBIN A1C
Est. average glucose Bld gHb Est-mCnc: 209 mg/dL
Hgb A1c MFr Bld: 8.9 % — ABNORMAL HIGH (ref 4.8–5.6)

## 2021-10-18 ENCOUNTER — Other Ambulatory Visit: Payer: Self-pay | Admitting: Family Medicine

## 2021-10-18 MED ORDER — ATORVASTATIN CALCIUM 40 MG PO TABS: 40.0000 mg | ORAL_TABLET | Freq: Every day | ORAL | 1 refills | Status: DC

## 2021-10-18 MED ORDER — OZEMPIC (0.25 OR 0.5 MG/DOSE) 2 MG/1.5ML ~~LOC~~ SOPN: 0.2500 mg | PEN_INJECTOR | SUBCUTANEOUS | 0 refills | Status: DC

## 2021-12-10 ENCOUNTER — Telehealth: Payer: Self-pay

## 2021-12-10 ENCOUNTER — Ambulatory Visit: Payer: BC Managed Care – PPO | Admitting: Nurse Practitioner

## 2021-12-10 MED ORDER — SEMAGLUTIDE (1 MG/DOSE) 4 MG/3ML ~~LOC~~ SOPN: PEN_INJECTOR | SUBCUTANEOUS | 0 refills | Status: DC

## 2021-12-10 NOTE — Telephone Encounter (Signed)
Patient states that he is currently on the 0.5 mg and he has not had any side effects except for losing some weight. Advise pt that will send in 1 mg for a month to call back and give feed back on the dosage. Verbalized understanding.

## 2021-12-10 NOTE — Telephone Encounter (Signed)
Dr.Cook patient; last seen in May. Please advise. Thank you!

## 2021-12-10 NOTE — Telephone Encounter (Signed)
Encourage patient to contact the pharmacy for refills or they can request refills through [facility]  (Please schedule appointment if patient has not been seen in over a year)    WHAT PHARMACY WOULD THEY LIKE THIS SENT TO: [facility] [phone] - [place], [place] - [address] AT [address] [place] DOSE:Semaglutide,0.25 or 0.'5MG'$ /DOS, (OZEMPIC, 0.25 OR 0.5 MG/DOSE,) 2 MG/1.5ML SOPN [[id number]]   NOTES/COMMENTS FROM PATIENT:      [place] office please notify patient: It takes 48-72 hours to process rx refill requests Ask patient to call pharmacy to ensure rx is ready before heading there.

## 2021-12-10 NOTE — Telephone Encounter (Signed)
Prescription sent

## 2021-12-10 NOTE — Telephone Encounter (Signed)
With the Ozempic more than likely is started off at 0.25 If he is currently at 0.5 and tolerating it I would recommend going to the 1 mg If he is currently utilizing 0.25 I would recommend going to 0.5 Please clarify with patient what dosing he is currently using Typically with Ozempic the goal is every month to gradually increase the dose of the medicine to try to allow for best outcomes

## 2021-12-25 ENCOUNTER — Telehealth: Payer: Self-pay | Admitting: Orthopedic Surgery

## 2021-12-25 NOTE — Telephone Encounter (Signed)
Call received in regard to a records request per Social Security disability, however, disability representative relays she will re-request additional pages from the [place] records faxed

## 2021-12-26 NOTE — Telephone Encounter (Signed)
A voice message from caller Altha Harm from [place] [employer] [place] disability again asking about missing pages ("only received 54 of the 75 pages") from fax from [facility] main medical records office. It was not our fax or phone number noted. Left main ph [phone], however, I had already requested that they call the number and/or re-fax request for missing information to the fax number on their response.

## 2022-01-07 ENCOUNTER — Other Ambulatory Visit: Payer: Self-pay | Admitting: *Deleted

## 2022-01-07 MED ORDER — ATORVASTATIN CALCIUM 40 MG PO TABS
40.0000 mg | ORAL_TABLET | Freq: Every day | ORAL | 0 refills | Status: DC
Start: 2022-01-07 — End: 2022-02-21

## 2022-01-07 MED ORDER — METFORMIN HCL 1000 MG PO TABS: ORAL_TABLET | ORAL | 0 refills | Status: DC

## 2022-01-09 ENCOUNTER — Ambulatory Visit (INDEPENDENT_AMBULATORY_CARE_PROVIDER_SITE_OTHER): Payer: BC Managed Care – PPO | Admitting: Nurse Practitioner

## 2022-01-09 ENCOUNTER — Encounter: Payer: Self-pay | Admitting: Nurse Practitioner

## 2022-01-09 VITALS — BP 126/83 | HR 83 | Ht 74.0 in | Wt 227.0 lb

## 2022-01-09 DIAGNOSIS — E782 Mixed hyperlipidemia: Secondary | ICD-10-CM | POA: Diagnosis not present

## 2022-01-09 DIAGNOSIS — E1165 Type 2 diabetes mellitus with hyperglycemia: Secondary | ICD-10-CM | POA: Diagnosis not present

## 2022-01-09 DIAGNOSIS — I10 Essential (primary) hypertension: Secondary | ICD-10-CM

## 2022-01-09 MED ORDER — SEMAGLUTIDE (1 MG/DOSE) 4 MG/3ML ~~LOC~~ SOPN: PEN_INJECTOR | SUBCUTANEOUS | 0 refills | Status: DC

## 2022-01-09 MED ORDER — METFORMIN HCL 1000 MG PO TABS: ORAL_TABLET | ORAL | 3 refills | Status: DC

## 2022-01-09 NOTE — Progress Notes (Signed)
Endocrinology Follow Up Note       01/09/2022, 11:31 AM   Subjective:    Patient ID: Paul Barnes, male    DOB: 1965-08-08.  Paul Barnes is being seen in follow up after being seen in consultation for management of currently uncontrolled symptomatic diabetes requested by  Coral Spikes, DO.   Past Medical History:  Diagnosis Date   Allergic rhinitis    Allergy    Arthritis    Diabetes mellitus without complication ([place])    History of kidney stones    Hyperlipidemia    not on meds    Past Surgical History:  Procedure Laterality Date   KNEE ARTHROSCOPY WITH MEDIAL MENISECTOMY Right 02/11/2014   Procedure: KNEE ARTHROSCOPY WITH MEDIAL MENISECTOMY AND CHONDROPLASTY;  Surgeon: Carole Civil, MD;  Location: [facility];  Service: Orthopedics;  Laterality: Right;   KNEE ARTHROSCOPY WITH MEDIAL MENISECTOMY Left 01/15/2018   Procedure: Chondroplasty medial  condyle  and trochlea left knee;  Surgeon: Carole Civil, MD;  Location: [facility];  Service: Orthopedics;  Laterality: Left;   PLANTAR FASCIA RELEASE Right 11/09/2014   Procedure: ENDOSCOPIC PLANTAR FASCIOTOMY;  Surgeon: Caprice Beaver, DPM;  Location: [facility];  Service: Podiatry;  Laterality: Right;   SHOULDER ARTHROSCOPY Right     Social History   Socioeconomic History   Marital status: Married    Spouse name: Not on file   Number of children: Not on file   Years of education: Not on file   Highest education level: Not on file  Occupational History   Occupation: Dealer  Tobacco Use   Smoking status: Never   Smokeless tobacco: Former    Types: Chew    Quit date: 01/08/2017  Vaping Use   Vaping Use: Never used  Substance and Sexual Activity   Alcohol use: Yes    Comment: occassional   Drug use: No   Sexual activity: Yes    Birth control/protection: None  Other Topics Concern   Not on file  Social History Narrative   Not on file    Social Determinants of Health   Financial Resource Strain: Not on file  Food Insecurity: Not on file  Transportation Needs: Not on file  Physical Activity: Not on file  Stress: Not on file  Social Connections: Not on file    Family History  Problem Relation Age of Onset   Heart disease Unknown    Cancer Unknown    Diabetes Unknown    Cancer Mother    Heart disease Father    Colon cancer Neg Hx    Colon polyps Neg Hx    Esophageal cancer Neg Hx    Stomach cancer Neg Hx    Rectal cancer Neg Hx     Outpatient Encounter Medications as of 01/09/2022  Medication Sig   Accu-Chek Softclix Lancets lancets Use to check sugars every day   albuterol (PROVENTIL HFA;VENTOLIN HFA) 108 (90 Base) MCG/ACT inhaler INHALE 2 PUFFS INTO THE LUNGS EVERY 6 HOURS AS NEEDED FOR WHEEZING OR SHORTNESS OF BREATH   atorvastatin (LIPITOR) 40 MG tablet Take 1 tablet (40 mg total) by mouth daily.   Cholecalciferol (VITAMIN D3) 25 MCG (  1000 UT) CAPS Take 1,000 Units by mouth daily.   diclofenac (VOLTAREN) 75 MG EC tablet TAKE 1 TABLET(75 MG) BY MOUTH TWICE DAILY WITH A MEAL   fluticasone (FLONASE) 50 MCG/ACT nasal spray Place into both nostrils daily.   glucose blood (ACCU-CHEK GUIDE) test strip USE TO TEST BLOOD SUGAR twice daily   Melatonin 5 MG TABS Take 5 mg by mouth at bedtime as needed (sleep).   metFORMIN (GLUCOPHAGE) 1000 MG tablet TAKE 1 TABLET(1000 MG) BY MOUTH TWICE DAILY WITH A MEAL   Multiple Vitamin (MULTIVITAMIN WITH MINERALS) TABS tablet Take 1 tablet by mouth daily.   Semaglutide, 1 MG/DOSE, 4 MG/3ML SOPN Inject 1 mg into skin once a week   tadalafil (CIALIS) 20 MG tablet Take 1 tablet (20 mg total) by mouth daily as needed for erectile dysfunction.   traZODone (DESYREL) 50 MG tablet Take 1 tablet (50 mg total) by mouth at bedtime as needed for sleep.   [DISCONTINUED] metFORMIN (GLUCOPHAGE) 1000 MG tablet TAKE 1 TABLET(1000 MG) BY MOUTH TWICE DAILY WITH A MEAL   [DISCONTINUED] Semaglutide, 1  MG/DOSE, 4 MG/3ML SOPN Inject 1 mg into skin once a week   No facility-administered encounter medications on file as of 01/09/2022.    ALLERGIES: No Known Allergies  VACCINATION STATUS:  There is no immunization history on file for this patient.  Diabetes He presents for his follow-up diabetic visit. He has type 2 diabetes mellitus. Onset time: Diagnosed at approx age of 56. His disease course has been improving. There are no hypoglycemic associated symptoms. Associated symptoms include fatigue and foot paresthesias. Pertinent negatives for diabetes include no blurred vision, no polydipsia and no polyuria. There are no hypoglycemic complications. Symptoms are stable. Diabetic complications include nephropathy. Risk factors for coronary artery disease include diabetes mellitus, dyslipidemia, hypertension and male sex. Current diabetic treatment includes oral agent (monotherapy) (and Ozempic). He is compliant with treatment most of the time. His weight is fluctuating minimally. He is following a generally healthy diet. When asked about meal planning, he reported none. He has not had a previous visit with a dietitian. He participates in exercise intermittently. His home blood glucose trend is decreasing steadily. His breakfast blood glucose range is generally 110-130 mg/dl. His overall blood glucose range is 110-130 mg/dl. (He presents today with his logs, no meter, showing at goal glycemic profile overall.  He was not yet due for another A1c today.  His PCP started him on Ozempic and has titrated up to the 1 mg dose.  He is asking about lowering his Metformin in hopes he can taper off.) An ACE inhibitor/angiotensin II receptor blocker is not being taken. He does not see a podiatrist.Eye exam is current.  Hyperlipidemia This is a chronic problem. The current episode started more than 1 year ago. The problem is uncontrolled. Recent lipid tests were reviewed and are high. Exacerbating diseases include  chronic renal disease and diabetes. Factors aggravating his hyperlipidemia include fatty foods. Current antihyperlipidemic treatment includes statins. Compliance problems include adherence to diet, adherence to exercise and medication side effects (has myalgias with statin).  Risk factors for coronary artery disease include diabetes mellitus, dyslipidemia, family history, male sex and hypertension.  Hypertension This is a chronic problem. The current episode started more than 1 year ago. The problem has been resolved since onset. The problem is controlled. Pertinent negatives include no blurred vision. There are no associated agents to hypertension. Risk factors for coronary artery disease include diabetes mellitus, dyslipidemia, family history and  male gender. Past treatments include nothing. Compliance problems include diet and exercise.  Hypertensive end-organ damage includes kidney disease. Identifiable causes of hypertension include chronic renal disease.     Review of systems  Constitutional: + steadily decreasing body weight,  current Body mass index is 29.15 kg/m. , no fatigue, no subjective hyperthermia, no subjective hypothermia Eyes: no blurry vision, no xerophthalmia ENT: no sore throat, no nodules palpated in throat, no dysphagia/odynophagia, no hoarseness Cardiovascular: no chest pain, no shortness of breath, no palpitations, no leg swelling Respiratory: no cough, no shortness of breath Gastrointestinal: no nausea/vomiting/diarrhea Musculoskeletal: no muscle/joint aches Skin: no rashes, no hyperemia Neurological: no tremors, no numbness, no tingling, no dizziness, + burning pain to bilateral feet Psychiatric: no depression, no anxiety  Objective:     BP 126/83   Pulse 83   Ht '6\' 2"'$  (1.88 m)   Wt 227 lb (103 kg)   BMI 29.15 kg/m   Wt Readings from Last 3 Encounters:  01/09/22 227 lb (103 kg)  10/16/21 238 lb (108 kg)  09/10/21 247 lb (112 kg)     BP Readings from Last  3 Encounters:  01/09/22 126/83  10/16/21 118/77  09/10/21 130/82      Physical Exam- Limited  Constitutional:  Body mass index is 29.15 kg/m. , not in acute distress, normal state of mind Eyes:  EOMI, no exophthalmos Neck: Supple Cardiovascular: RRR, no murmurs, rubs, or gallops, no edema Respiratory: Adequate breathing efforts, no crackles, rales, rhonchi, or wheezing Musculoskeletal: no gross deformities, strength intact in all four extremities, no gross restriction of joint movements Skin:  no rashes, no hyperemia Neurological: no tremor with outstretched hands  Diabetic Foot Exam - Simple   Simple Foot Form Visual Inspection No deformities, no ulcerations, no other skin breakdown bilaterally: Yes Sensation Testing Intact to touch and monofilament testing bilaterally: Yes Pulse Check Posterior Tibialis and Dorsalis pulse intact bilaterally: Yes Comments     CMP ( most recent) CMP     Component Value Date/Time   NA 136 07/19/2021 1221   K 4.4 07/19/2021 1221   CL 100 07/19/2021 1221   CO2 22 07/19/2021 1221   GLUCOSE 288 (H) 07/19/2021 1221   GLUCOSE 154 (H) 01/08/2018 1357   BUN 17 07/19/2021 1221   CREATININE 1.06 07/19/2021 1221   CALCIUM 9.3 07/19/2021 1221   PROT 6.6 07/19/2021 1221   ALBUMIN 4.5 07/19/2021 1221   AST 11 07/19/2021 1221   ALT 14 07/19/2021 1221   ALKPHOS 100 07/19/2021 1221   BILITOT 0.8 07/19/2021 1221   GFRNONAA 79 09/27/2019 0858   GFRAA 91 09/27/2019 0858     Diabetic Labs (most recent): Lab Results  Component Value Date   HGBA1C 8.9 (H) 10/16/2021   HGBA1C 10.9 (H) 07/19/2021   HGBA1C 10.0 (H) 09/05/2020   MICROALBUR 30 09/10/2021     Lipid Panel ( most recent) Lipid Panel     Component Value Date/Time   CHOL 224 (H) 10/16/2021 1039   TRIG 255 (H) 10/16/2021 1039   HDL 44 10/16/2021 1039   CHOLHDL 5.1 (H) 10/16/2021 1039   LDLCALC 134 (H) 10/16/2021 1039   LABVLDL 46 (H) 10/16/2021 1039      No results found  for: "TSH", "FREET4"         Assessment & Plan:   1) Type 2 diabetes mellitus with hyperglycemia, without long-term current use of insulin (HCC)  He presents today with his logs, no meter, showing at goal glycemic profile overall.  He was not yet due for another A1c today.  His PCP started him on Ozempic and has titrated up to the 1 mg dose.  He is asking about lowering his Metformin in hopes he can taper off.  - [name] has currently uncontrolled symptomatic type 2 DM since 56 years of age.   -Recent labs reviewed.  - I had a long discussion with him about the progressive nature of diabetes and the pathology behind its complications. -his diabetes is complicated by mild CKD and neuropathy and he remains at a high risk for more acute and chronic complications which include CAD, CVA, CKD, retinopathy, and neuropathy. These are all discussed in detail with him.  - Nutritional counseling repeated at each appointment due to patients tendency to fall back in to old habits.  - The patient admits there is a room for improvement in their diet and drink choices. -  Suggestion is made for the patient to avoid simple carbohydrates from their diet including Cakes, Sweet Desserts / Pastries, Ice Cream, Soda (diet and regular), Sweet Tea, Candies, Chips, Cookies, Sweet Pastries, Store Bought Juices, Alcohol in Excess of 1-2 drinks a day, Artificial Sweeteners, Coffee Creamer, and "Sugar-free" Products. This will help patient to have stable blood glucose profile and potentially avoid unintended weight gain.   - I encouraged the patient to switch to unprocessed or minimally processed complex starch and increased protein intake (animal or plant source), fruits, and vegetables.   - Patient is advised to stick to a routine mealtimes to eat 3 meals a day and avoid unnecessary snacks (to snack only to correct hypoglycemia).  - he will be scheduled with Jearld Fenton, RDN, CDE for diabetes  education.  - I have approached him with the following individualized plan to manage his diabetes and patient agrees:   -He is advised to continue Ozempic 1 mg SQ weekly (would not recommend going up to the 2 mg dose due to body habitus).  Will lower his Metformin to 500 mg po twice daily with meals.  -he is encouraged to continue monitoring blood glucose twice daily, before breakfast and before bed, and to call the clinic if he has readings less than 70 or above 300 for 3 tests in a row.    - Adjustment parameters are given to him for hypo and hyperglycemia in writing.  - Specific targets for  A1c; LDL, HDL, and Triglycerides were discussed with the patient.  2) Blood Pressure /Hypertension:  his blood pressure is controlled to target without the use of antihypertensive medications.  He will be considered for low dose ACE/ARB on subsequent visits if BP is above 140/90 on 3 separate occasions.  3) Lipids/Hyperlipidemia:    Review of his recent lipid panel from 07/19/21 showed uncontrolled LDL at 153 and elevated triglycerides of 267 . He does report significant myalgias with his statin, therefore he hopes to be taken off it in the future with lifestyle changes.  4)  Weight/Diet:  his Body mass index is 29.15 kg/m.  -  clearly complicating his diabetes care.   he is a candidate for weight loss. I discussed with him the fact that loss of 5 - 10% of his  current body weight will have the most impact on his diabetes management.  Exercise, and detailed carbohydrates information provided  -  detailed on discharge instructions.  5) Chronic Care/Health Maintenance: -he is not on ACEI/ARB or Statin medications and is encouraged to initiate and continue to follow up  with Ophthalmology, Dentist, Podiatrist at least yearly or according to recommendations, and advised to stay away from smoking. I have recommended yearly flu vaccine and pneumonia vaccine at least every 5 years; moderate intensity exercise  for up to 150 minutes weekly; and sleep for at least 7 hours a day.  - he is advised to maintain close follow up with Coral Spikes, DO for primary care needs, as well as his other providers for optimal and coordinated care.     I spent 40 minutes in the care of the patient today including review of labs from [place], Lipids, Thyroid Function, Hematology (current and previous including abstractions from other facilities); face-to-face time discussing  his blood glucose readings/logs, discussing hypoglycemia and hyperglycemia episodes and symptoms, medications doses, his options of short and long term treatment based on the latest standards of care / guidelines;  discussion about incorporating lifestyle medicine;  and documenting the encounter. Risk reduction counseling performed per USPSTF guidelines to reduce obesity and cardiovascular risk factors.     Please refer to Patient Instructions for Blood Glucose Monitoring and Insulin/Medications Dosing Guide"  in media tab for additional information. Please  also refer to " Patient Self Inventory" in the Media  tab for reviewed elements of pertinent patient history.  [name] participated in the discussions, expressed understanding, and voiced agreement with the above plans.  All questions were answered to his satisfaction. he is encouraged to contact clinic should he have any questions or concerns prior to his return visit.     Follow up plan: - Return in about 3 months (around 04/11/2022) for Diabetes F/U with A1c in office, No previsit labs, Bring meter and logs.   Rayetta Pigg, [facility] [address] [place], [place] [address] Phone: [phone] Fax: [phone]  01/09/2022, 11:31 AM

## 2022-01-16 ENCOUNTER — Ambulatory Visit (INDEPENDENT_AMBULATORY_CARE_PROVIDER_SITE_OTHER): Payer: BC Managed Care – PPO | Admitting: Family Medicine

## 2022-01-16 ENCOUNTER — Ambulatory Visit: Payer: Self-pay | Admitting: Family Medicine

## 2022-01-16 VITALS — BP 112/80 | HR 80 | Temp 97.2°F | Ht 74.0 in | Wt 224.0 lb

## 2022-01-16 DIAGNOSIS — E782 Mixed hyperlipidemia: Secondary | ICD-10-CM | POA: Diagnosis not present

## 2022-01-16 DIAGNOSIS — E1165 Type 2 diabetes mellitus with hyperglycemia: Secondary | ICD-10-CM

## 2022-01-16 NOTE — Progress Notes (Signed)
Subjective:  Patient ID: ESVIN HNAT, male    DOB: 15-Jul-1965  Age: 56 y.o. MRN: [id number]  CC: Chief Complaint  Patient presents with   Diabetes    HPI:  56 year old male with type 2 diabetes, osteoarthritis, hyperlipidemia, erectile dysfunction, insomnia presents for follow-up.  Patient follows with me as well as endocrinology.  Metformin has recently been decreased.  He is on Ozempic 1 mg weekly as well.  Blood sugars improving.  He has lost some weight.  Overall he feels like he is improving.  Needs A1c.  Patient's hyperlipidemia has been uncontrolled.  Recent increase in Lipitor.  Needs lipid panel today to reassess.  Patient denies any chest pain or shortness of breath.  He is overall doing well per his report.  Patient Active Problem List   Diagnosis Date Noted   Insomnia 10/16/2021   Uncontrolled type 2 diabetes mellitus with hyperglycemia ([place]) 07/19/2021   Hyperlipidemia 07/19/2021   Erectile dysfunction 07/19/2021   Osteoarthritis of both knees 40/98/1191   Umbilical hernia 47/82/9562   S/P left knee arthroscopy 01/15/18  01/22/2018    Social Hx   Social History   Socioeconomic History   Marital status: Married    Spouse name: Not on file   Number of children: Not on file   Years of education: Not on file   Highest education level: Not on file  Occupational History   Occupation: Dealer  Tobacco Use   Smoking status: Never   Smokeless tobacco: Former    Types: Chew    Quit date: 01/08/2017  Vaping Use   Vaping Use: Never used  Substance and Sexual Activity   Alcohol use: Yes    Comment: occassional   Drug use: No   Sexual activity: Yes    Birth control/protection: None  Other Topics Concern   Not on file  Social History Narrative   Not on file   Social Determinants of Health   Financial Resource Strain: Not on file  Food Insecurity: Not on file  Transportation Needs: Not on file  Physical Activity: Not on file  Stress: Not on file   Social Connections: Not on file    Review of Systems Per HPI  Objective:  BP 112/80   Pulse 80   Temp (!) 97.2 F (36.2 C)   Ht '6\' 2"'$  (1.88 m)   Wt 224 lb (101.6 kg)   SpO2 99%   BMI 28.76 kg/m      01/16/2022    8:48 AM 01/09/2022   10:21 AM 10/16/2021   10:06 AM  BP/Weight  Systolic BP 130 865 784  Diastolic BP 80 83 77  Wt. (Lbs) 224 227 238  BMI 28.76 kg/m2 29.15 kg/m2 30.56 kg/m2    Physical Exam Vitals and nursing note reviewed.  Constitutional:      General: He is not in acute distress.    Appearance: Normal appearance.  HENT:     Head: Normocephalic and atraumatic.  Eyes:     General:        Right eye: No discharge.        Left eye: No discharge.     Conjunctiva/sclera: Conjunctivae normal.  Cardiovascular:     Rate and Rhythm: Normal rate and regular rhythm.  Pulmonary:     Effort: Pulmonary effort is normal.     Breath sounds: Normal breath sounds. No wheezing, rhonchi or rales.  Neurological:     Mental Status: He is alert.  Psychiatric:  Mood and Affect: Mood normal.        Behavior: Behavior normal.     Lab Results  Component Value Date   WBC 5.8 07/19/2021   HGB 15.7 07/19/2021   HCT 46.1 07/19/2021   PLT 255 07/19/2021   GLUCOSE 288 (H) 07/19/2021   CHOL 224 (H) 10/16/2021   TRIG 255 (H) 10/16/2021   HDL 44 10/16/2021   LDLCALC 134 (H) 10/16/2021   ALT 14 07/19/2021   AST 11 07/19/2021   NA 136 07/19/2021   K 4.4 07/19/2021   CL 100 07/19/2021   CREATININE 1.06 07/19/2021   BUN 17 07/19/2021   CO2 22 07/19/2021   HGBA1C 8.9 (H) 10/16/2021   MICROALBUR 30 09/10/2021     Assessment & Plan:   Problem List Items Addressed This Visit       Endocrine   Uncontrolled type 2 diabetes mellitus with hyperglycemia ([place]) - Primary    Seems to be improving.  Awaiting A1c.  Continue Ozempic and metformin.      Relevant Orders   Hemoglobin A1c     Other   Hyperlipidemia    Reassessing today with lipid panel.  Continue  Lipitor at current dosing.  If lipids remained elevated will need to increase to 80.      Relevant Orders   Lipid panel    Follow-up:  Return in about 3 months (around 04/18/2022).  [place]

## 2022-01-16 NOTE — Assessment & Plan Note (Signed)
Seems to be improving.  Awaiting A1c.  Continue Ozempic and metformin.

## 2022-01-16 NOTE — Assessment & Plan Note (Signed)
Reassessing today with lipid panel.  Continue Lipitor at current dosing.  If lipids remained elevated will need to increase to 80.

## 2022-01-16 NOTE — Patient Instructions (Signed)
Things are progressing well.  Labs today.  Follow up in 3 months.

## 2022-01-21 ENCOUNTER — Other Ambulatory Visit: Payer: Self-pay | Admitting: Family Medicine

## 2022-01-21 ENCOUNTER — Telehealth: Payer: Self-pay

## 2022-01-21 MED ORDER — NIRMATRELVIR/RITONAVIR (PAXLOVID)TABLET: 3.0000 | ORAL_TABLET | Freq: Two times a day (BID) | ORAL | 0 refills | Status: AC

## 2022-01-21 NOTE — Telephone Encounter (Signed)
Coral Spikes, DO     Rx sent. Would hold statin while taking.

## 2022-01-21 NOTE — Telephone Encounter (Signed)
Caller name:[place]   On DPR? :Yes  Call back number:[phone]  Provider they see: Lacinda Axon    Reason for call:Pt tested positive for Covid Saturday and wants to know if Paxlovid can be called into [facility] on [address]

## 2022-01-21 NOTE — Telephone Encounter (Signed)
Patient notified and verbalized understanding. 

## 2022-02-15 DIAGNOSIS — E1165 Type 2 diabetes mellitus with hyperglycemia: Secondary | ICD-10-CM | POA: Diagnosis not present

## 2022-02-15 DIAGNOSIS — E782 Mixed hyperlipidemia: Secondary | ICD-10-CM | POA: Diagnosis not present

## 2022-02-16 LAB — HEMOGLOBIN A1C
Est. average glucose Bld gHb Est-mCnc: 146 mg/dL
Hgb A1c MFr Bld: 6.7 % — ABNORMAL HIGH (ref 4.8–5.6)

## 2022-02-16 LAB — LIPID PANEL
Chol/HDL Ratio: 4.6 ratio (ref 0.0–5.0)
Cholesterol, Total: 209 mg/dL — ABNORMAL HIGH (ref 100–199)
HDL: 45 mg/dL (ref >39)
LDL Chol Calc (NIH): 104 mg/dL — ABNORMAL HIGH (ref 0–99)
Triglycerides: 354 mg/dL — ABNORMAL HIGH (ref 0–149)
VLDL Cholesterol Cal: 60 mg/dL — ABNORMAL HIGH (ref 5–40)

## 2022-02-21 MED ORDER — SEMAGLUTIDE (1 MG/DOSE) 4 MG/3ML ~~LOC~~ SOPN: PEN_INJECTOR | SUBCUTANEOUS | 0 refills | Status: DC

## 2022-02-21 MED ORDER — ATORVASTATIN CALCIUM 80 MG PO TABS: 80.0000 mg | ORAL_TABLET | Freq: Every day | ORAL | 0 refills | Status: DC

## 2022-02-21 NOTE — Addendum Note (Signed)
Addended by: Dairl Ponder on: 02/21/2022 11:32 AM   Modules accepted: Orders

## 2022-03-26 ENCOUNTER — Other Ambulatory Visit: Payer: Self-pay | Admitting: Family Medicine

## 2022-04-11 ENCOUNTER — Ambulatory Visit (INDEPENDENT_AMBULATORY_CARE_PROVIDER_SITE_OTHER): Payer: BC Managed Care – PPO | Admitting: Nurse Practitioner

## 2022-04-11 ENCOUNTER — Encounter: Payer: Self-pay | Admitting: Nurse Practitioner

## 2022-04-11 VITALS — BP 118/76 | HR 76 | Ht 74.0 in | Wt 226.0 lb

## 2022-04-11 DIAGNOSIS — I10 Essential (primary) hypertension: Secondary | ICD-10-CM | POA: Diagnosis not present

## 2022-04-11 DIAGNOSIS — E1165 Type 2 diabetes mellitus with hyperglycemia: Secondary | ICD-10-CM

## 2022-04-11 DIAGNOSIS — E782 Mixed hyperlipidemia: Secondary | ICD-10-CM

## 2022-04-11 MED ORDER — OZEMPIC (1 MG/DOSE) 4 MG/3ML ~~LOC~~ SOPN
1.0000 mg | PEN_INJECTOR | SUBCUTANEOUS | 3 refills | Status: DC
Start: 2022-04-11 — End: 2022-07-24

## 2022-04-11 NOTE — Progress Notes (Signed)
Endocrinology Follow Up Note       04/11/2022, 11:21 AM   Subjective:    Patient ID: Paul Barnes, male    DOB: Sep 22, 1965.  Paul Barnes is being seen in follow up after being seen in consultation for management of currently uncontrolled symptomatic diabetes requested by  Coral Spikes, DO.   Past Medical History:  Diagnosis Date   Allergic rhinitis    Allergy    Arthritis    Diabetes mellitus without complication ([place])    History of kidney stones    Hyperlipidemia    not on meds    Past Surgical History:  Procedure Laterality Date   KNEE ARTHROSCOPY WITH MEDIAL MENISECTOMY Right 02/11/2014   Procedure: KNEE ARTHROSCOPY WITH MEDIAL MENISECTOMY AND CHONDROPLASTY;  Surgeon: Carole Civil, MD;  Location: [facility];  Service: Orthopedics;  Laterality: Right;   KNEE ARTHROSCOPY WITH MEDIAL MENISECTOMY Left 01/15/2018   Procedure: Chondroplasty medial  condyle  and trochlea left knee;  Surgeon: Carole Civil, MD;  Location: [facility];  Service: Orthopedics;  Laterality: Left;   PLANTAR FASCIA RELEASE Right 11/09/2014   Procedure: ENDOSCOPIC PLANTAR FASCIOTOMY;  Surgeon: Caprice Beaver, DPM;  Location: [facility];  Service: Podiatry;  Laterality: Right;   SHOULDER ARTHROSCOPY Right     Social History   Socioeconomic History   Marital status: Married    Spouse name: Not on file   Number of children: Not on file   Years of education: Not on file   Highest education level: Not on file  Occupational History   Occupation: Dealer  Tobacco Use   Smoking status: Never   Smokeless tobacco: Former    Types: Chew    Quit date: 01/08/2017  Vaping Use   Vaping Use: Never used  Substance and Sexual Activity   Alcohol use: Yes    Comment: occassional   Drug use: No   Sexual activity: Yes    Birth control/protection: None  Other Topics Concern   Not on file  Social History Narrative   Not on file    Social Determinants of Health   Financial Resource Strain: Not on file  Food Insecurity: Not on file  Transportation Needs: Not on file  Physical Activity: Not on file  Stress: Not on file  Social Connections: Not on file    Family History  Problem Relation Age of Onset   Heart disease Unknown    Cancer Unknown    Diabetes Unknown    Cancer Mother    Heart disease Father    Colon cancer Neg Hx    Colon polyps Neg Hx    Esophageal cancer Neg Hx    Stomach cancer Neg Hx    Rectal cancer Neg Hx     Outpatient Encounter Medications as of 04/11/2022  Medication Sig   Accu-Chek Softclix Lancets lancets Use to check sugars every day   albuterol (PROVENTIL HFA;VENTOLIN HFA) 108 (90 Base) MCG/ACT inhaler INHALE 2 PUFFS INTO THE LUNGS EVERY 6 HOURS AS NEEDED FOR WHEEZING OR SHORTNESS OF BREATH   atorvastatin (LIPITOR) 80 MG tablet Take 1 tablet (80 mg total) by mouth daily.   Cholecalciferol (VITAMIN D3) 25 MCG (  1000 UT) CAPS Take 1,000 Units by mouth daily.   diclofenac (VOLTAREN) 75 MG EC tablet TAKE 1 TABLET(75 MG) BY MOUTH TWICE DAILY WITH A MEAL   fluticasone (FLONASE) 50 MCG/ACT nasal spray Place into both nostrils daily.   glucose blood (ACCU-CHEK GUIDE) test strip USE TO TEST BLOOD SUGAR twice daily   Melatonin 5 MG TABS Take 5 mg by mouth at bedtime as needed (sleep).   Multiple Vitamin (MULTIVITAMIN WITH MINERALS) TABS tablet Take 1 tablet by mouth daily.   tadalafil (CIALIS) 20 MG tablet Take 1 tablet (20 mg total) by mouth daily as needed for erectile dysfunction.   traZODone (DESYREL) 50 MG tablet Take 1 tablet (50 mg total) by mouth at bedtime as needed for sleep.   [DISCONTINUED] metFORMIN (GLUCOPHAGE) 1000 MG tablet TAKE 1 TABLET(1000 MG) BY MOUTH TWICE DAILY WITH A MEAL   [DISCONTINUED] OZEMPIC, 1 MG/DOSE, 4 MG/3ML SOPN INJECT '1MG'$  INTO THE SKIN ONCE WEEKLY   Semaglutide, 1 MG/DOSE, (OZEMPIC, 1 MG/DOSE,) 4 MG/3ML SOPN Inject 1 mg into the skin once a week.   No  facility-administered encounter medications on file as of 04/11/2022.    ALLERGIES: No Known Allergies  VACCINATION STATUS:  There is no immunization history on file for this patient.  Diabetes He presents for his follow-up diabetic visit. He has type 2 diabetes mellitus. Onset time: Diagnosed at approx age of 56. His disease course has been improving. There are no hypoglycemic associated symptoms. Associated symptoms include fatigue and foot paresthesias. Pertinent negatives for diabetes include no blurred vision, no polydipsia and no polyuria. There are no hypoglycemic complications. Symptoms are stable. Diabetic complications include nephropathy. Risk factors for coronary artery disease include diabetes mellitus, dyslipidemia, hypertension and male sex. Current diabetic treatment includes oral agent (monotherapy) (and Ozempic). He is compliant with treatment most of the time. His weight is fluctuating minimally. He is following a generally healthy diet. When asked about meal planning, he reported none. He has not had a previous visit with a dietitian. He participates in exercise intermittently. His home blood glucose trend is fluctuating minimally. His breakfast blood glucose range is generally 110-130 mg/dl. His overall blood glucose range is 110-130 mg/dl. (He presents today with his logs showing at target glycemic profile overall.  His previsit A1c on 9/15 was 6.7%, improving drastically from last visit of 8.9%.  He denies any significant hypoglycemia.  He did say he stopped his Metformin while he was at the beach and his glucose readings were controlled.) An ACE inhibitor/angiotensin II receptor blocker is not being taken. He does not see a podiatrist.Eye exam is current.  Hyperlipidemia This is a chronic problem. The current episode started more than 1 year ago. The problem is uncontrolled. Recent lipid tests were reviewed and are high. Exacerbating diseases include chronic renal disease and  diabetes. Factors aggravating his hyperlipidemia include fatty foods. Current antihyperlipidemic treatment includes statins. Compliance problems include adherence to diet, adherence to exercise and medication side effects (has myalgias with statin).  Risk factors for coronary artery disease include diabetes mellitus, dyslipidemia, family history, male sex and hypertension.  Hypertension This is a chronic problem. The current episode started more than 1 year ago. The problem has been resolved since onset. The problem is controlled. Pertinent negatives include no blurred vision. There are no associated agents to hypertension. Risk factors for coronary artery disease include diabetes mellitus, dyslipidemia, family history and male gender. Past treatments include nothing. Compliance problems include diet and exercise.  Hypertensive end-organ damage  includes kidney disease. Identifiable causes of hypertension include chronic renal disease.     Review of systems  Constitutional: + stable body weight,  current Body mass index is 29.02 kg/m. , no fatigue, no subjective hyperthermia, no subjective hypothermia Eyes: no blurry vision, no xerophthalmia ENT: no sore throat, no nodules palpated in throat, no dysphagia/odynophagia, no hoarseness Cardiovascular: no chest pain, no shortness of breath, no palpitations, no leg swelling Respiratory: no cough, no shortness of breath Gastrointestinal: no nausea/vomiting/diarrhea Musculoskeletal: no muscle/joint aches Skin: no rashes, no hyperemia Neurological: no tremors, no numbness, no tingling, no dizziness, + burning pain to bilateral feet-improved Psychiatric: no depression, no anxiety  Objective:     BP 118/76 (BP Location: Left Arm, Patient Position: Sitting, Cuff Size: Large)   Pulse 76   Ht '6\' 2"'$  (1.88 m)   Wt 226 lb (102.5 kg)   BMI 29.02 kg/m   Wt Readings from Last 3 Encounters:  04/11/22 226 lb (102.5 kg)  01/16/22 224 lb (101.6 kg)  01/09/22  227 lb (103 kg)     BP Readings from Last 3 Encounters:  04/11/22 118/76  01/16/22 112/80  01/09/22 126/83      Physical Exam- Limited  Constitutional:  Body mass index is 29.02 kg/m. , not in acute distress, normal state of mind Eyes:  EOMI, no exophthalmos Neck: Supple Cardiovascular: RRR, no murmurs, rubs, or gallops, no edema Respiratory: Adequate breathing efforts, no crackles, rales, rhonchi, or wheezing Musculoskeletal: no gross deformities, strength intact in all four extremities, no gross restriction of joint movements Skin:  no rashes, no hyperemia Neurological: no tremor with outstretched hands  Diabetic Foot Exam - Simple   No data filed     CMP ( most recent) CMP     Component Value Date/Time   NA 136 07/19/2021 1221   K 4.4 07/19/2021 1221   CL 100 07/19/2021 1221   CO2 22 07/19/2021 1221   GLUCOSE 288 (H) 07/19/2021 1221   GLUCOSE 154 (H) 01/08/2018 1357   BUN 17 07/19/2021 1221   CREATININE 1.06 07/19/2021 1221   CALCIUM 9.3 07/19/2021 1221   PROT 6.6 07/19/2021 1221   ALBUMIN 4.5 07/19/2021 1221   AST 11 07/19/2021 1221   ALT 14 07/19/2021 1221   ALKPHOS 100 07/19/2021 1221   BILITOT 0.8 07/19/2021 1221   GFRNONAA 79 09/27/2019 0858   GFRAA 91 09/27/2019 0858     Diabetic Labs (most recent): Lab Results  Component Value Date   HGBA1C 6.7 (H) 02/15/2022   HGBA1C 8.9 (H) 10/16/2021   HGBA1C 10.9 (H) 07/19/2021   MICROALBUR 30 09/10/2021     Lipid Panel ( most recent) Lipid Panel     Component Value Date/Time   CHOL 209 (H) 02/15/2022 1619   TRIG 354 (H) 02/15/2022 1619   HDL 45 02/15/2022 1619   CHOLHDL 4.6 02/15/2022 1619   LDLCALC 104 (H) 02/15/2022 1619   LABVLDL 60 (H) 02/15/2022 1619      No results found for: "TSH", "FREET4"         Assessment & Plan:   1) Type 2 diabetes mellitus with hyperglycemia, without long-term current use of insulin ([place])  He presents today with his logs showing at target glycemic profile  overall.  His previsit A1c on 9/15 was 6.7%, improving drastically from last visit of 8.9%.  He denies any significant hypoglycemia.  He did say he stopped his Metformin while he was at the beach and his glucose readings were controlled.  -  Paul Barnes has currently uncontrolled symptomatic type 2 DM since 56 years of age.   -Recent labs reviewed.  - I had a long discussion with him about the progressive nature of diabetes and the pathology behind its complications. -his diabetes is complicated by mild CKD and neuropathy and he remains at a high risk for more acute and chronic complications which include CAD, CVA, CKD, retinopathy, and neuropathy. These are all discussed in detail with him.  - Nutritional counseling repeated at each appointment due to patients tendency to fall back in to old habits.  - The patient admits there is a room for improvement in their diet and drink choices. -  Suggestion is made for the patient to avoid simple carbohydrates from their diet including Cakes, Sweet Desserts / Pastries, Ice Cream, Soda (diet and regular), Sweet Tea, Candies, Chips, Cookies, Sweet Pastries, Store Bought Juices, Alcohol in Excess of 1-2 drinks a day, Artificial Sweeteners, Coffee Creamer, and "Sugar-free" Products. This will help patient to have stable blood glucose profile and potentially avoid unintended weight gain.   - I encouraged the patient to switch to unprocessed or minimally processed complex starch and increased protein intake (animal or plant source), fruits, and vegetables.   - Patient is advised to stick to a routine mealtimes to eat 3 meals a day and avoid unnecessary snacks (to snack only to correct hypoglycemia).  - he will be scheduled with Jearld Fenton, RDN, CDE for diabetes education.  - I have approached him with the following individualized plan to manage his diabetes and patient agrees:   -He is advised to continue Ozempic 1 mg SQ weekly (would not recommend  going up to the 2 mg dose due to body habitus).  He can formally stop his Metformin for now.  -he can take a break from routine monitoring due to safe therapy with Ozempic only.  He prefers to monitor once daily.  - Adjustment parameters are given to him for hypo and hyperglycemia in writing.  - Specific targets for  A1c; LDL, HDL, and Triglycerides were discussed with the patient.  2) Blood Pressure /Hypertension:  his blood pressure is controlled to target without the use of antihypertensive medications.  He will be considered for low dose ACE/ARB on subsequent visits if BP is above 140/90 on 3 separate occasions.  3) Lipids/Hyperlipidemia:    Review of his recent lipid panel from 02/15/22 showed uncontrolled LDL at 104 (improving) and elevated triglycerides of 354 . He does report significant myalgias with his statin, therefore he hopes to be taken off it in the future with lifestyle changes.  4)  Weight/Diet:  his Body mass index is 29.02 kg/m.  -  clearly complicating his diabetes care.   he is a candidate for weight loss. I discussed with him the fact that loss of 5 - 10% of his  current body weight will have the most impact on his diabetes management.  Exercise, and detailed carbohydrates information provided  -  detailed on discharge instructions.  5) Chronic Care/Health Maintenance: -he is not on ACEI/ARB or Statin medications and is encouraged to initiate and continue to follow up with Ophthalmology, Dentist, Podiatrist at least yearly or according to recommendations, and advised to stay away from smoking. I have recommended yearly flu vaccine and pneumonia vaccine at least every 5 years; moderate intensity exercise for up to 150 minutes weekly; and sleep for at least 7 hours a day.  - he is advised to maintain close follow up  with Coral Spikes, DO for primary care needs, as well as his other providers for optimal and coordinated care.      I spent 35 minutes in the care of the  patient today including review of labs from [place], Lipids, Thyroid Function, Hematology (current and previous including abstractions from other facilities); face-to-face time discussing  his blood glucose readings/logs, discussing hypoglycemia and hyperglycemia episodes and symptoms, medications doses, his options of short and long term treatment based on the latest standards of care / guidelines;  discussion about incorporating lifestyle medicine;  and documenting the encounter. Risk reduction counseling performed per USPSTF guidelines to reduce obesity and cardiovascular risk factors.     Please refer to Patient Instructions for Blood Glucose Monitoring and Insulin/Medications Dosing Guide"  in media tab for additional information. Please  also refer to " Patient Self Inventory" in the Media  tab for reviewed elements of pertinent patient history.  [name] participated in the discussions, expressed understanding, and voiced agreement with the above plans.  All questions were answered to his satisfaction. he is encouraged to contact clinic should he have any questions or concerns prior to his return visit.     Follow up plan: - Return in about 4 months (around 08/10/2022) for Diabetes F/U with A1c in office.   Rayetta Pigg, [facility] [address] [place], [place] [address] Phone: [phone] Fax: [phone]  04/11/2022, 11:21 AM

## 2022-04-16 ENCOUNTER — Other Ambulatory Visit: Payer: Self-pay | Admitting: Family Medicine

## 2022-04-18 ENCOUNTER — Ambulatory Visit (INDEPENDENT_AMBULATORY_CARE_PROVIDER_SITE_OTHER): Payer: BC Managed Care – PPO | Admitting: Family Medicine

## 2022-04-18 ENCOUNTER — Encounter: Payer: Self-pay | Admitting: Family Medicine

## 2022-04-18 VITALS — BP 121/77 | HR 83 | Temp 98.2°F | Wt 229.2 lb

## 2022-04-18 DIAGNOSIS — E782 Mixed hyperlipidemia: Secondary | ICD-10-CM | POA: Diagnosis not present

## 2022-04-18 DIAGNOSIS — E119 Type 2 diabetes mellitus without complications: Secondary | ICD-10-CM | POA: Diagnosis not present

## 2022-04-18 DIAGNOSIS — Z1211 Encounter for screening for malignant neoplasm of colon: Secondary | ICD-10-CM

## 2022-04-18 NOTE — Progress Notes (Signed)
Subjective:  Patient ID: Paul Barnes, male    DOB: 25-Apr-1966  Age: 56 y.o. MRN: [id number]  CC: Chief Complaint  Patient presents with   Diabetes    Pt arrives for follow up on DM. No issues. Checking sugars regularly.     HPI:  56 year old male with type 2 diabetes, osteoarthritis, hyperlipidemia presents for follow-up.  Patient's diabetes is now A1c 6.7 (lab reviewed today).  He is compliant with Ozempic.  He has made some lifestyle changes and is doing well at this time.  Patient is in need of an eye exam.  Advised to schedule.  Lipids not yet at goal. Most recent lipid panel reviewed - LDL  104. He is on atorvastatin. Stopped recently while sick. EMR shows that he has filled this medication recently.   Patient is in need of colon cancer screening.  He does not want to proceed with colonoscopy at this time.  Offered Cologuard.  He is okay with this.  We will place order.  Patient Active Problem List   Diagnosis Date Noted   Insomnia 10/16/2021   Hyperlipidemia 07/19/2021   Erectile dysfunction 07/19/2021   Osteoarthritis of both knees 17/49/4496   Umbilical hernia 75/91/6384   S/P left knee arthroscopy 01/15/18  01/22/2018   Type 2 diabetes mellitus without complications ([place]) 66/59/9357    Social Hx   Social History   Socioeconomic History   Marital status: Married    Spouse name: Not on file   Number of children: Not on file   Years of education: Not on file   Highest education level: Not on file  Occupational History   Occupation: Dealer  Tobacco Use   Smoking status: Never   Smokeless tobacco: Former    Types: Chew    Quit date: 01/08/2017  Vaping Use   Vaping Use: Never used  Substance and Sexual Activity   Alcohol use: Yes    Comment: occassional   Drug use: No   Sexual activity: Yes    Birth control/protection: None  Other Topics Concern   Not on file  Social History Narrative   Not on file   Social Determinants of Health   Financial  Resource Strain: Not on file  Food Insecurity: Not on file  Transportation Needs: Not on file  Physical Activity: Not on file  Stress: Not on file  Social Connections: Not on file    Review of Systems  Constitutional: Negative.   Respiratory: Negative.    Cardiovascular: Negative.    Objective:  BP 121/77   Pulse 83   Temp 98.2 F (36.8 C)   Wt 229 lb 3.2 oz (104 kg)   SpO2 99%   BMI 29.43 kg/m      04/18/2022    8:35 AM 04/11/2022   10:51 AM 01/16/2022    8:48 AM  BP/Weight  Systolic BP 017 793 903  Diastolic BP 77 76 80  Wt. (Lbs) 229.2 226 224  BMI 29.43 kg/m2 29.02 kg/m2 28.76 kg/m2    Physical Exam Vitals and nursing note reviewed.  Constitutional:      General: He is not in acute distress.    Appearance: Normal appearance.  HENT:     Head: Normocephalic and atraumatic.  Eyes:     General:        Right eye: No discharge.        Left eye: No discharge.     Conjunctiva/sclera: Conjunctivae normal.  Cardiovascular:     Rate and Rhythm:  Normal rate and regular rhythm.  Pulmonary:     Effort: Pulmonary effort is normal.     Breath sounds: Normal breath sounds. No wheezing or rales.  Neurological:     Mental Status: He is alert.  Psychiatric:        Mood and Affect: Mood normal.        Behavior: Behavior normal.    Lab Results  Component Value Date   WBC 5.8 07/19/2021   HGB 15.7 07/19/2021   HCT 46.1 07/19/2021   PLT 255 07/19/2021   GLUCOSE 288 (H) 07/19/2021   CHOL 209 (H) 02/15/2022   TRIG 354 (H) 02/15/2022   HDL 45 02/15/2022   LDLCALC 104 (H) 02/15/2022   ALT 14 07/19/2021   AST 11 07/19/2021   NA 136 07/19/2021   K 4.4 07/19/2021   CL 100 07/19/2021   CREATININE 1.06 07/19/2021   BUN 17 07/19/2021   CO2 22 07/19/2021   HGBA1C 6.7 (H) 02/15/2022   MICROALBUR 30 09/10/2021     Assessment & Plan:   Problem List Items Addressed This Visit       Endocrine   Type 2 diabetes mellitus without complications ([place]) - Primary    Now  doing well.  A1c is at goal.  Continue Ozempic.        Other   Hyperlipidemia    Continue Lipitor.  We will reassess at follow-up.      Other Visit Diagnoses     Colon cancer screening       Relevant Orders   Cologuard      Follow-up:  3 months  [place]

## 2022-04-18 NOTE — Assessment & Plan Note (Signed)
Now doing well.  A1c is at goal.  Continue Ozempic.

## 2022-04-18 NOTE — Assessment & Plan Note (Signed)
Continue Lipitor.  We will reassess at follow-up.

## 2022-04-18 NOTE — Patient Instructions (Signed)
Continue your medications.  Follow up in 3 months.  Take care  Dr. Jack Bolio  

## 2022-04-25 ENCOUNTER — Emergency Department (HOSPITAL_COMMUNITY): Payer: BC Managed Care – PPO

## 2022-04-25 ENCOUNTER — Encounter (HOSPITAL_COMMUNITY): Payer: Self-pay | Admitting: Emergency Medicine

## 2022-04-25 ENCOUNTER — Other Ambulatory Visit: Payer: Self-pay

## 2022-04-25 ENCOUNTER — Emergency Department (HOSPITAL_COMMUNITY)
Admission: EM | Admit: 2022-04-25 | Discharge: 2022-04-25 | Disposition: A | Discharge status: Home / Self Care | Payer: BC Managed Care – PPO | Attending: Emergency Medicine | Admitting: Emergency Medicine

## 2022-04-25 DIAGNOSIS — S82401A Unspecified fracture of shaft of right fibula, initial encounter for closed fracture: Secondary | ICD-10-CM | POA: Diagnosis not present

## 2022-04-25 DIAGNOSIS — L089 Local infection of the skin and subcutaneous tissue, unspecified: Secondary | ICD-10-CM

## 2022-04-25 DIAGNOSIS — Z794 Long term (current) use of insulin: Secondary | ICD-10-CM | POA: Diagnosis not present

## 2022-04-25 DIAGNOSIS — M7989 Other specified soft tissue disorders: Secondary | ICD-10-CM | POA: Diagnosis not present

## 2022-04-25 DIAGNOSIS — M79661 Pain in right lower leg: Secondary | ICD-10-CM | POA: Diagnosis not present

## 2022-04-25 DIAGNOSIS — M79604 Pain in right leg: Secondary | ICD-10-CM | POA: Diagnosis not present

## 2022-04-25 MED ORDER — DOXYCYCLINE HYCLATE 100 MG PO TABS
100.0000 mg | ORAL_TABLET | Freq: Once | ORAL | Status: AC
  Administered 2022-04-25: 100 mg via ORAL
  Filled 2022-04-25: qty 1

## 2022-04-25 MED ORDER — DOXYCYCLINE HYCLATE 100 MG PO CAPS: 100.0000 mg | ORAL_CAPSULE | Freq: Two times a day (BID) | ORAL | 0 refills | Status: DC

## 2022-04-25 MED ORDER — HYDROCODONE-ACETAMINOPHEN 5-325 MG PO TABS: 1.0000 | ORAL_TABLET | Freq: Four times a day (QID) | ORAL | 0 refills | Status: DC | PRN
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
## 2022-04-25 NOTE — Discharge Instructions (Signed)
Use elevation and gentle heating pad or warm compresses 20 minutes 3-4 times daily to your calf.  You may use the Ace wrap for gentle compression, use the crutches to help minimize weightbearing as needed.  Make sure to take the entire course of the antibiotics prescribed.  You have also been prescribed hydrocodone to help you with your pain, do not drive within 4 hours of taking this medication as that will make you drowsy.
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
## 2022-04-27 DIAGNOSIS — E119 Type 2 diabetes mellitus without complications: Secondary | ICD-10-CM | POA: Diagnosis not present

## 2022-04-27 DIAGNOSIS — E785 Hyperlipidemia, unspecified: Secondary | ICD-10-CM | POA: Diagnosis not present

## 2022-04-27 DIAGNOSIS — R000 Tachycardia, unspecified: Secondary | ICD-10-CM | POA: Diagnosis not present

## 2022-04-27 DIAGNOSIS — L03115 Cellulitis of right lower limb: Secondary | ICD-10-CM | POA: Diagnosis not present

## 2022-04-27 DIAGNOSIS — E11628 Type 2 diabetes mellitus with other skin complications: Secondary | ICD-10-CM | POA: Diagnosis not present

## 2022-04-27 DIAGNOSIS — M79604 Pain in right leg: Secondary | ICD-10-CM | POA: Diagnosis not present

## 2022-04-27 DIAGNOSIS — L02415 Cutaneous abscess of right lower limb: Secondary | ICD-10-CM | POA: Diagnosis not present

## 2022-04-27 DIAGNOSIS — M7989 Other specified soft tissue disorders: Secondary | ICD-10-CM | POA: Diagnosis not present

## 2022-04-27 DIAGNOSIS — M79661 Pain in right lower leg: Secondary | ICD-10-CM | POA: Diagnosis not present

## 2022-05-17 ENCOUNTER — Other Ambulatory Visit: Payer: Self-pay | Admitting: Family Medicine

## 2022-06-19 DIAGNOSIS — L02415 Cutaneous abscess of right lower limb: Secondary | ICD-10-CM | POA: Diagnosis not present

## 2022-07-12 DIAGNOSIS — E785 Hyperlipidemia, unspecified: Secondary | ICD-10-CM | POA: Diagnosis not present

## 2022-07-12 DIAGNOSIS — L02415 Cutaneous abscess of right lower limb: Secondary | ICD-10-CM | POA: Diagnosis not present

## 2022-07-12 DIAGNOSIS — E119 Type 2 diabetes mellitus without complications: Secondary | ICD-10-CM | POA: Diagnosis not present

## 2022-07-17 DIAGNOSIS — T148XXA Other injury of unspecified body region, initial encounter: Secondary | ICD-10-CM | POA: Diagnosis not present

## 2022-07-24 ENCOUNTER — Ambulatory Visit (HOSPITAL_COMMUNITY)
Admission: RE | Admit: 2022-07-24 | Discharge: 2022-07-24 | Disposition: A | Discharge status: Home / Self Care | Payer: BC Managed Care – PPO | Source: Ambulatory Visit | Attending: Family Medicine | Admitting: Family Medicine

## 2022-07-24 ENCOUNTER — Ambulatory Visit (INDEPENDENT_AMBULATORY_CARE_PROVIDER_SITE_OTHER): Payer: BC Managed Care – PPO | Admitting: Family Medicine

## 2022-07-24 ENCOUNTER — Other Ambulatory Visit (HOSPITAL_COMMUNITY): Payer: Self-pay

## 2022-07-24 DIAGNOSIS — R1011 Right upper quadrant pain: Secondary | ICD-10-CM | POA: Insufficient documentation

## 2022-07-24 DIAGNOSIS — E782 Mixed hyperlipidemia: Secondary | ICD-10-CM

## 2022-07-24 DIAGNOSIS — E119 Type 2 diabetes mellitus without complications: Secondary | ICD-10-CM | POA: Diagnosis not present

## 2022-07-24 DIAGNOSIS — K838 Other specified diseases of biliary tract: Secondary | ICD-10-CM

## 2022-07-24 DIAGNOSIS — Z130 Encounter for screening for diseases of the blood and blood-forming organs and certain disorders involving the immune mechanism: Secondary | ICD-10-CM

## 2022-07-24 DIAGNOSIS — L659 Nonscarring hair loss, unspecified: Secondary | ICD-10-CM | POA: Diagnosis not present

## 2022-07-24 DIAGNOSIS — K824 Cholesterolosis of gallbladder: Secondary | ICD-10-CM | POA: Diagnosis not present

## 2022-07-24 DIAGNOSIS — R935 Abnormal findings on diagnostic imaging of other abdominal regions, including retroperitoneum: Secondary | ICD-10-CM | POA: Diagnosis not present

## 2022-07-24 DIAGNOSIS — Z125 Encounter for screening for malignant neoplasm of prostate: Secondary | ICD-10-CM

## 2022-07-24 MED ORDER — OZEMPIC (1 MG/DOSE) 4 MG/3ML ~~LOC~~ SOPN
1.0000 mg | PEN_INJECTOR | SUBCUTANEOUS | 3 refills | Status: DC
  Filled 2022-07-24: qty 3, 28d supply, fill #0

## 2022-07-24 NOTE — Assessment & Plan Note (Signed)
Lipid panel to assess control. Continue Lipitor.

## 2022-07-24 NOTE — Assessment & Plan Note (Signed)
Referring to dermatology.

## 2022-07-24 NOTE — Assessment & Plan Note (Signed)
EKG unremarkable. Labs today. RUQ US obtained and revealed gallbladder polyp, sludge and dilated CBD concerning for choledocholithiasis.  Awaiting lab results. Urgent MRCP.

## 2022-07-24 NOTE — Patient Instructions (Addendum)
I sent in the [place].  Labs today.  Hold the diclofenac.  We will call with results.  Follow up in 3-6 months (pending results)

## 2022-07-24 NOTE — Progress Notes (Signed)
Subjective:  Patient ID: Paul Barnes, male    DOB: Jun 22, 1965  Age: 57 y.o. MRN: [id number]  CC: Chief Complaint  Patient presents with   Diabetes    3 month follow up , Not received ozempic yet     HPI:  57 year old male with DM-2, HLD presents for follow up.  Diabetes has been well controlled on Ozempic. However, patient has been out of this for ~ 1 month. His pharmacy has been out of it and have not been able to fill it for him.  We need to try other pharmacies. Needs labs today. Need lipid panel to assess control of lipids. No issues with Lipitor.  Patient reports that on Monday evening he experienced epigastric pain and subsequently developed nausea and vomiting. Denies chest pain. It lasted into early Tuesday morning and then improved.  He is currently feeling well but is still concerned about recent abdominal pain, nausea, and vomiting.  Patient also reports hair loss. He states that he has had a bald spot at his crown for quite some time but has recently noticed other focal areas of hair loss. Also reports that he has lost his mustache as well.    Patient Active Problem List   Diagnosis Date Noted   RUQ pain 07/24/2022   Alopecia 07/24/2022   Insomnia 10/16/2021   Hyperlipidemia 07/19/2021   Erectile dysfunction 07/19/2021   Osteoarthritis of both knees 0000000   Umbilical hernia 0000000   S/P left knee arthroscopy 01/15/18  01/22/2018   Type 2 diabetes mellitus without complications ([place]) 99991111    Social Hx   Social History   Socioeconomic History   Marital status: Married    Spouse name: Not on file   Number of children: Not on file   Years of education: Not on file   Highest education level: Not on file  Occupational History   Occupation: Dealer  Tobacco Use   Smoking status: Never   Smokeless tobacco: Former    Types: Chew    Quit date: 01/08/2017  Vaping Use   Vaping Use: Never used  Substance and Sexual Activity   Alcohol use: Yes     Comment: occassional   Drug use: No   Sexual activity: Yes    Birth control/protection: None  Other Topics Concern   Not on file  Social History Narrative   Not on file   Social Determinants of Health   Financial Resource Strain: Not on file  Food Insecurity: Not on file  Transportation Needs: Not on file  Physical Activity: Not on file  Stress: Not on file  Social Connections: Not on file    Review of Systems Per HPI  Objective:  BP 116/82   Pulse 82   Temp (!) 97.2 F (36.2 C)   Ht 6' 2"$  (1.88 m)   Wt 232 lb (105.2 kg)   SpO2 97%   BMI 29.79 kg/m      07/24/2022    8:50 AM 04/25/2022   11:21 AM 04/25/2022    9:00 AM  BP/Weight  Systolic BP 99991111 [id number]  Diastolic BP 82 80 85  Wt. (Lbs) 232    BMI 29.79 kg/m2      Physical Exam Vitals and nursing note reviewed.  Constitutional:      General: He is not in acute distress.    Appearance: Normal appearance.  HENT:     Head: Normocephalic and atraumatic.     Comments: Focal areas of hair loss  noted. Cardiovascular:     Rate and Rhythm: Normal rate and regular rhythm.  Pulmonary:     Effort: Pulmonary effort is normal.     Breath sounds: Normal breath sounds. No wheezing or rales.  Abdominal:     General: There is no distension.     Palpations: Abdomen is soft.     Comments: Exquisite RUQ tenderness to palpation.  Neurological:     Mental Status: He is alert.  Psychiatric:        Mood and Affect: Mood normal.        Behavior: Behavior normal.     Lab Results  Component Value Date   WBC 5.8 07/19/2021   HGB 15.7 07/19/2021   HCT 46.1 07/19/2021   PLT 255 07/19/2021   GLUCOSE 288 (H) 07/19/2021   CHOL 209 (H) 02/15/2022   TRIG 354 (H) 02/15/2022   HDL 45 02/15/2022   LDLCALC 104 (H) 02/15/2022   ALT 14 07/19/2021   AST 11 07/19/2021   NA 136 07/19/2021   K 4.4 07/19/2021   CL 100 07/19/2021   CREATININE 1.06 07/19/2021   BUN 17 07/19/2021   CO2 22 07/19/2021   HGBA1C 6.7 (H)  02/15/2022   MICROALBUR 30 09/10/2021     Assessment & Plan:   Problem List Items Addressed This Visit       Endocrine   Type 2 diabetes mellitus without complications ([place])    [id number] today. Ozempic sent to [facility].      Relevant Medications   Semaglutide, 1 MG/DOSE, (OZEMPIC, 1 MG/DOSE,) 4 MG/3ML SOPN   Other Relevant Orders   CMP14+EGFR   Hemoglobin A1c   Microalbumin / creatinine urine ratio     Musculoskeletal and Integument   Alopecia    Referring to dermatology.      Relevant Orders   Ambulatory referral to Dermatology     Other   RUQ pain    EKG unremarkable. Labs today. RUQ US obtained and revealed gallbladder polyp, sludge and dilated CBD concerning for choledocholithiasis.  Awaiting lab results. Urgent MRCP.      Relevant Orders   EKG 12-Lead (Completed)   US Abdomen Limited RUQ (LIVER/GB) (Completed)   MR ABDOMEN MRCP WO CONTRAST   Hyperlipidemia    Lipid panel to assess control. Continue Lipitor.      Relevant Orders   Lipid panel   Other Visit Diagnoses     Screening for deficiency anemia       Relevant Orders   CBC   Screening for prostate cancer       Relevant Orders   PSA   Dilation of common bile duct       Relevant Orders   MR ABDOMEN MRCP WO CONTRAST       Meds ordered this encounter  Medications   Semaglutide, 1 MG/DOSE, (OZEMPIC, 1 MG/DOSE,) 4 MG/3ML SOPN    Sig: Inject 1 mg into the skin once a week.    Dispense:  6 mL    Refill:  3    Follow-up:  Pending labs  [place]

## 2022-07-24 NOTE — Assessment & Plan Note (Signed)
A1C today. Ozempic sent to [facility] long.

## 2022-07-25 ENCOUNTER — Other Ambulatory Visit: Payer: Self-pay | Admitting: Family Medicine

## 2022-07-25 ENCOUNTER — Ambulatory Visit (HOSPITAL_BASED_OUTPATIENT_CLINIC_OR_DEPARTMENT_OTHER)
Admission: RE | Admit: 2022-07-25 | Discharge: 2022-07-25 | Disposition: A | Discharge status: Home / Self Care | Payer: BC Managed Care – PPO | Source: Ambulatory Visit | Attending: Family Medicine | Admitting: Family Medicine

## 2022-07-25 DIAGNOSIS — R1011 Right upper quadrant pain: Secondary | ICD-10-CM | POA: Insufficient documentation

## 2022-07-25 DIAGNOSIS — K8020 Calculus of gallbladder without cholecystitis without obstruction: Secondary | ICD-10-CM | POA: Insufficient documentation

## 2022-07-25 LAB — CBC
Hematocrit: 48.7 % (ref 37.5–51.0)
Hemoglobin: 16.2 g/dL (ref 13.0–17.7)
MCH: 30.5 pg (ref 26.6–33.0)
MCHC: 33.3 g/dL (ref 31.5–35.7)
MCV: 92 fL (ref 79–97)
Platelets: 322 10*3/uL (ref 150–450)
RBC: 5.32 x10E6/uL (ref 4.14–5.80)
RDW: 12.3 % (ref 11.6–15.4)
WBC: 6.6 10*3/uL (ref 3.4–10.8)

## 2022-07-25 LAB — CMP14+EGFR
ALT: 62 IU/L — ABNORMAL HIGH (ref 0–44)
AST: 20 IU/L (ref 0–40)
Albumin/Globulin Ratio: 2 (ref 1.2–2.2)
Albumin: 4.6 g/dL (ref 3.8–4.9)
Alkaline Phosphatase: 139 IU/L — ABNORMAL HIGH (ref 44–121)
BUN/Creatinine Ratio: 15 (ref 9–20)
BUN: 17 mg/dL (ref 6–24)
Bilirubin Total: 0.8 mg/dL (ref 0.0–1.2)
CO2: 22 mmol/L (ref 20–29)
Calcium: 10.1 mg/dL (ref 8.7–10.2)
Chloride: 101 mmol/L (ref 96–106)
Creatinine, Ser: 1.17 mg/dL (ref 0.76–1.27)
Globulin, Total: 2.3 g/dL (ref 1.5–4.5)
Glucose: 251 mg/dL — ABNORMAL HIGH (ref 70–99)
Potassium: 4.8 mmol/L (ref 3.5–5.2)
Sodium: 139 mmol/L (ref 134–144)
Total Protein: 6.9 g/dL (ref 6.0–8.5)
eGFR: 72 mL/min/{1.73_m2} (ref >59)

## 2022-07-25 LAB — LIPID PANEL
Chol/HDL Ratio: 3.8 ratio (ref 0.0–5.0)
Cholesterol, Total: 213 mg/dL — ABNORMAL HIGH (ref 100–199)
HDL: 56 mg/dL (ref >39)
LDL Chol Calc (NIH): 137 mg/dL — ABNORMAL HIGH (ref 0–99)
Triglycerides: 115 mg/dL (ref 0–149)
VLDL Cholesterol Cal: 20 mg/dL (ref 5–40)

## 2022-07-25 LAB — MICROALBUMIN / CREATININE URINE RATIO
Creatinine, Urine: 320.7 mg/dL
Microalb/Creat Ratio: 3 mg/g creat (ref 0–29)
Microalbumin, Urine: 9 ug/mL

## 2022-07-25 LAB — HEMOGLOBIN A1C
Est. average glucose Bld gHb Est-mCnc: 214 mg/dL
Hgb A1c MFr Bld: 9.1 % — ABNORMAL HIGH (ref 4.8–5.6)

## 2022-07-25 LAB — PSA: Prostate Specific Ag, Serum: 0.7 ng/mL (ref 0.0–4.0)

## 2022-07-25 MED ORDER — GADOBUTROL 1 MMOL/ML IV SOLN
10.0000 mL | Freq: Once | INTRAVENOUS | Status: AC | PRN
  Administered 2022-07-25: 10 mL via INTRAVENOUS
  Filled 2022-07-25: qty 10

## 2022-08-01 ENCOUNTER — Encounter: Payer: Self-pay | Admitting: Radiology

## 2022-08-10 ENCOUNTER — Other Ambulatory Visit (HOSPITAL_COMMUNITY): Payer: Self-pay

## 2022-08-13 ENCOUNTER — Ambulatory Visit: Payer: BC Managed Care – PPO | Admitting: Nurse Practitioner

## 2022-09-01 ENCOUNTER — Other Ambulatory Visit (HOSPITAL_COMMUNITY): Payer: Self-pay

## 2022-09-17 ENCOUNTER — Telehealth: Payer: Self-pay

## 2022-09-17 NOTE — Telephone Encounter (Signed)
PA request received via CMM for Ozempic (1 MG/DOSE) /3ML pen-injectors  PA has been submitted to [facility] and has been APPROVED from 09/17/2022-09/17/2023  Key: [id number]

## 2022-09-19 ENCOUNTER — Other Ambulatory Visit (HOSPITAL_COMMUNITY): Payer: Self-pay

## 2022-09-19 ENCOUNTER — Encounter: Payer: Self-pay | Admitting: Nurse Practitioner

## 2022-09-19 ENCOUNTER — Ambulatory Visit (INDEPENDENT_AMBULATORY_CARE_PROVIDER_SITE_OTHER): Payer: BC Managed Care – PPO | Admitting: Nurse Practitioner

## 2022-09-19 VITALS — BP 126/86 | HR 69 | Ht 74.0 in | Wt 234.4 lb

## 2022-09-19 DIAGNOSIS — E1165 Type 2 diabetes mellitus with hyperglycemia: Secondary | ICD-10-CM

## 2022-09-19 MED ORDER — OZEMPIC (0.25 OR 0.5 MG/DOSE) 2 MG/1.5ML ~~LOC~~ SOPN: 0.5000 mg | PEN_INJECTOR | SUBCUTANEOUS | 3 refills | Status: DC

## 2022-09-19 NOTE — Progress Notes (Signed)
Endocrinology Follow Up Note       09/19/2022, 10:18 AM   Subjective:    Patient ID: Paul Barnes, male    DOB: [birth year].  Paul Barnes is being seen in follow up after being seen in consultation for management of currently uncontrolled symptomatic diabetes requested by  Tommie Sams, DO.   Past Medical History:  Diagnosis Date   Allergic rhinitis    Allergy    Arthritis    Diabetes mellitus without complication    History of kidney stones    Hyperlipidemia    not on meds    Past Surgical History:  Procedure Laterality Date   KNEE ARTHROSCOPY WITH MEDIAL MENISECTOMY Right 02/11/2014   Procedure: KNEE ARTHROSCOPY WITH MEDIAL MENISECTOMY AND CHONDROPLASTY;  Surgeon: Vickki Hearing, MD;  Location: [facility];  Service: Orthopedics;  Laterality: Right;   KNEE ARTHROSCOPY WITH MEDIAL MENISECTOMY Left 01/15/2018   Procedure: Chondroplasty medial  condyle  and trochlea left knee;  Surgeon: Vickki Hearing, MD;  Location: [facility];  Service: Orthopedics;  Laterality: Left;   PLANTAR FASCIA RELEASE Right 11/09/2014   Procedure: ENDOSCOPIC PLANTAR FASCIOTOMY;  Surgeon: Ferman Hamming, DPM;  Location: [facility];  Service: Podiatry;  Laterality: Right;   SHOULDER ARTHROSCOPY Right     Social History   Socioeconomic History   Marital status: Married    Spouse name: Not on file   Number of children: Not on file   Years of education: Not on file   Highest education level: Not on file  Occupational History   Occupation: Curator  Tobacco Use   Smoking status: Never   Smokeless tobacco: Former    Types: Chew    Quit date: 01/08/2017  Vaping Use   Vaping Use: Never used  Substance and Sexual Activity   Alcohol use: Yes    Comment: occassional   Drug use: No   Sexual activity: Yes    Birth control/protection: None  Other Topics Concern   Not on file  Social History Narrative   Not on file    Social Determinants of Health   Financial Resource Strain: Not on file  Food Insecurity: Not on file  Transportation Needs: Not on file  Physical Activity: Not on file  Stress: Not on file  Social Connections: Not on file    Family History  Problem Relation Age of Onset   Heart disease Unknown    Cancer Unknown    Diabetes Unknown    Cancer Mother    Heart disease Father    Colon cancer Neg Hx    Colon polyps Neg Hx    Esophageal cancer Neg Hx    Stomach cancer Neg Hx    Rectal cancer Neg Hx     Outpatient Encounter Medications as of 09/19/2022  Medication Sig   Accu-Chek Softclix Lancets lancets Use to check sugars every day   albuterol (PROVENTIL HFA;VENTOLIN HFA) 108 (90 Base) MCG/ACT inhaler INHALE 2 PUFFS INTO THE LUNGS EVERY 6 HOURS AS NEEDED FOR WHEEZING OR SHORTNESS OF BREATH   atorvastatin (LIPITOR) 80 MG tablet Take 1 tablet (80 mg total) by mouth daily.   Cholecalciferol (VITAMIN D3) 25 MCG (1000  UT) CAPS Take 1,000 Units by mouth daily.   diclofenac (VOLTAREN) 75 MG EC tablet TAKE 1 TABLET(75 MG) BY MOUTH TWICE DAILY WITH A MEAL   fluticasone (FLONASE) 50 MCG/ACT nasal spray Place into both nostrils daily.   glucose blood (ACCU-CHEK GUIDE) test strip USE TO TEST BLOOD SUGAR twice daily   Melatonin 5 MG TABS Take 5 mg by mouth at bedtime as needed (sleep).   Multiple Vitamin (MULTIVITAMIN WITH MINERALS) TABS tablet Take 1 tablet by mouth daily.   Semaglutide,0.25 or 0.5MG /DOS, (OZEMPIC, 0.25 OR 0.5 MG/DOSE,) 2 MG/1.5ML SOPN Inject 0.5 mg into the skin once a week.   tadalafil (CIALIS) 20 MG tablet Take 1 tablet (20 mg total) by mouth daily as needed for erectile dysfunction.   [DISCONTINUED] Semaglutide, 1 MG/DOSE, (OZEMPIC, 1 MG/DOSE,) 4 MG/3ML SOPN Inject 1 mg into the skin once a week. (Patient not taking: Reported on 09/19/2022)   No facility-administered encounter medications on file as of 09/19/2022.    ALLERGIES: Allergies  Allergen Reactions   Ozempic  (0.25 Or 0.5 Mg-Dose) [Semaglutide(0.25 Or 0.5mg -Dos)]     Patient started having GI issues, Gallbladder attack    VACCINATION STATUS:  There is no immunization history on file for this patient.  Diabetes He presents for his follow-up diabetic visit. He has type 2 diabetes mellitus. Onset time: Diagnosed at approx age of 78. His disease course has been worsening. There are no hypoglycemic associated symptoms. Associated symptoms include fatigue and foot paresthesias. Pertinent negatives for diabetes include no blurred vision, no polydipsia and no polyuria. There are no hypoglycemic complications. Symptoms are stable. Diabetic complications include nephropathy. Risk factors for coronary artery disease include diabetes mellitus, dyslipidemia, hypertension and male sex. When asked about current treatments, none were reported. Compliance with diabetes treatment: had trouble getting Ozempic due to supply issues. His weight is increasing steadily. He is following a generally healthy diet. When asked about meal planning, he reported none. He has not had a previous visit with a dietitian. He participates in exercise intermittently. His home blood glucose trend is increasing steadily. His overall blood glucose range is >200 mg/dl. (He presents today with no meter or logs to review.  His most recent A1c on 2/21 at his PCP was 9.1% increasing from last visit.  He reports he had infected cyst on leg since last visit and gall bladder issues which he thought was stemming from Ozempic, thus he stopped (also noted he was having trouble getting it due to supply issues).  He did not call to inform office of this trouble.) An ACE inhibitor/angiotensin II receptor blocker is not being taken. He does not see a podiatrist.Eye exam is current.  Hyperlipidemia This is a chronic problem. The current episode started more than 1 year ago. The problem is uncontrolled. Recent lipid tests were reviewed and are high. Exacerbating  diseases include chronic renal disease and diabetes. Factors aggravating his hyperlipidemia include fatty foods. Current antihyperlipidemic treatment includes statins. Compliance problems include adherence to diet, adherence to exercise and medication side effects (has myalgias with statin).  Risk factors for coronary artery disease include diabetes mellitus, dyslipidemia, family history, male sex and hypertension.  Hypertension This is a chronic problem. The current episode started more than 1 year ago. The problem has been resolved since onset. The problem is controlled. Pertinent negatives include no blurred vision. There are no associated agents to hypertension. Risk factors for coronary artery disease include diabetes mellitus, dyslipidemia, family history and male gender. Past treatments include  nothing. Compliance problems include diet and exercise.  Hypertensive end-organ damage includes kidney disease. Identifiable causes of hypertension include chronic renal disease.     Review of systems  Constitutional: + increasing body weight-currently off Ozempic,  current Body mass index is 30.1 kg/m. , no fatigue, no subjective hyperthermia, no subjective hypothermia Eyes: no blurry vision, no xerophthalmia ENT: no sore throat, no nodules palpated in throat, no dysphagia/odynophagia, no hoarseness Cardiovascular: no chest pain, no shortness of breath, no palpitations, no leg swelling Respiratory: no cough, no shortness of breath Gastrointestinal: no nausea/vomiting/diarrhea Musculoskeletal: no muscle/joint aches Skin: no rashes, no hyperemia Neurological: no tremors, no numbness, no tingling, no dizziness, + burning pain to bilateral feet-improved Psychiatric: no depression, no anxiety  Objective:     BP 126/86 (BP Location: Left Arm, Patient Position: Sitting, Cuff Size: Large)   Pulse 69   Ht 6\' 2"  (1.88 m)   Wt 234 lb 6.4 oz (106.3 kg)   BMI 30.10 kg/m   Wt Readings from Last 3  Encounters:  09/19/22 234 lb 6.4 oz (106.3 kg)  07/24/22 232 lb (105.2 kg)  04/25/22 229 lb 4.5 oz (104 kg)     BP Readings from Last 3 Encounters:  09/19/22 126/86  07/24/22 116/82  04/25/22 122/80      Physical Exam- Limited  Constitutional:  Body mass index is 30.1 kg/m. , not in acute distress, normal state of mind Eyes:  EOMI, no exophthalmos Musculoskeletal: no gross deformities, strength intact in all four extremities, no gross restriction of joint movements Skin:  no rashes, no hyperemia Neurological: no tremor with outstretched hands  Diabetic Foot Exam - Simple   No data filed     CMP ( most recent) CMP     Component Value Date/Time   NA 139 07/24/2022 0940   K 4.8 07/24/2022 0940   CL 101 07/24/2022 0940   CO2 22 07/24/2022 0940   GLUCOSE 251 (H) 07/24/2022 0940   GLUCOSE 154 (H) 01/08/2018 1357   BUN 17 07/24/2022 0940   CREATININE 1.17 07/24/2022 0940   CALCIUM 10.1 07/24/2022 0940   PROT 6.9 07/24/2022 0940   ALBUMIN 4.6 07/24/2022 0940   AST 20 07/24/2022 0940   ALT 62 (H) 07/24/2022 0940   ALKPHOS 139 (H) 07/24/2022 0940   BILITOT 0.8 07/24/2022 0940   GFRNONAA 79 09/27/2019 0858   GFRAA 91 09/27/2019 0858     Diabetic Labs (most recent): Lab Results  Component Value Date   HGBA1C 9.1 (H) 07/24/2022   HGBA1C 6.7 (H) 02/15/2022   HGBA1C 8.9 (H) 10/16/2021   MICROALBUR 30 09/10/2021     Lipid Panel ( most recent) Lipid Panel     Component Value Date/Time   CHOL 213 (H) 07/24/2022 0940   TRIG 115 07/24/2022 0940   HDL 56 07/24/2022 0940   CHOLHDL 3.8 07/24/2022 0940   LDLCALC 137 (H) 07/24/2022 0940   LABVLDL 20 07/24/2022 0940      No results found for: "TSH", "FREET4"         Assessment & Plan:   1) Type 2 diabetes mellitus with hyperglycemia, without long-term current use of insulin (HCC)  He presents today with no meter or logs to review.  His most recent A1c on 2/21 at his PCP was 9.1% increasing from last visit.  He  reports he had infected cyst on leg since last visit and gall bladder issues which he thought was stemming from Ozempic, thus he stopped (also noted he was having  trouble getting it due to supply issues).  He did not call to inform office of this trouble.  Jackqulyn Livings has currently uncontrolled symptomatic type 2 DM since 57 years of age.   -Recent labs reviewed.  - I had a long discussion with him about the progressive nature of diabetes and the pathology behind its complications. -his diabetes is complicated by mild CKD and neuropathy and he remains at a high risk for more acute and chronic complications which include CAD, CVA, CKD, retinopathy, and neuropathy. These are all discussed in detail with him.  - Nutritional counseling repeated at each appointment due to patients tendency to fall back in to old habits.  - The patient admits there is a room for improvement in their diet and drink choices. -  Suggestion is made for the patient to avoid simple carbohydrates from their diet including Cakes, Sweet Desserts / Pastries, Ice Cream, Soda (diet and regular), Sweet Tea, Candies, Chips, Cookies, Sweet Pastries, Store Bought Juices, Alcohol in Excess of 1-2 drinks a day, Artificial Sweeteners, Coffee Creamer, and "Sugar-free" Products. This will help patient to have stable blood glucose profile and potentially avoid unintended weight gain.   - I encouraged the patient to switch to unprocessed or minimally processed complex starch and increased protein intake (animal or plant source), fruits, and vegetables.   - Patient is advised to stick to a routine mealtimes to eat 3 meals a day and avoid unnecessary snacks (to snack only to correct hypoglycemia).  - he will be scheduled with Norm Salt, RDN, CDE for diabetes education.  - I have approached him with the following individualized plan to manage his diabetes and patient agrees:   -After discussing the options (restarting Meformin vs  Glipizide vs retrying Ozempic in lower dose) we decided to retry Ozempic 0.25 mg SQ weekly x 2 doses then increase to 0.5 mg SQ weekly.  He will be kept at this lower dose for a longer period of time if he is able to tolerate it.  I am unsure as to whether this may have caused his gall bladder issues, so dose changes will be made with caution.  He is aware to call me if the abdominal pain restarts.   -he is advised to monitor glucose once daily, before breakfast and any other time if experiencing symptoms and to call the clinic if he has readings less than 70 or above 300 for 3 tests in a row.  - Adjustment parameters are given to him for hypo and hyperglycemia in writing.  - Specific targets for  A1c; LDL, HDL, and Triglycerides were discussed with the patient.  2) Blood Pressure /Hypertension:  his blood pressure is controlled to target without the use of antihypertensive medications.  He will be considered for low dose ACE/ARB on subsequent visits if BP is above 140/90 on 3 separate occasions.  3) Lipids/Hyperlipidemia:    Review of his recent lipid panel from 07/24/22 showed uncontrolled LDL at 137 (worsening) but greatly improved triglycerides. He does report significant myalgias with his statin, therefore he hopes to be taken off it in the future with lifestyle changes.  4)  Weight/Diet:  his Body mass index is 30.1 kg/m.  -  clearly complicating his diabetes care.   he is a candidate for weight loss. I discussed with him the fact that loss of 5 - 10% of his  current body weight will have the most impact on his diabetes management.  Exercise, and detailed carbohydrates  information provided  -  detailed on discharge instructions.  5) Chronic Care/Health Maintenance: -he is not on ACEI/ARB or Statin medications and is encouraged to initiate and continue to follow up with Ophthalmology, Dentist, Podiatrist at least yearly or according to recommendations, and advised to stay away from smoking. I  have recommended yearly flu vaccine and pneumonia vaccine at least every 5 years; moderate intensity exercise for up to 150 minutes weekly; and sleep for at least 7 hours a day.  - he is advised to maintain close follow up with Tommie Sams, DO for primary care needs, as well as his other providers for optimal and coordinated care.      I spent  48  minutes in the care of the patient today including review of labs from CMP, Lipids, Thyroid Function, Hematology (current and previous including abstractions from other facilities); face-to-face time discussing  his blood glucose readings/logs, discussing hypoglycemia and hyperglycemia episodes and symptoms, medications doses, his options of short and long term treatment based on the latest standards of care / guidelines;  discussion about incorporating lifestyle medicine;  and documenting the encounter. Risk reduction counseling performed per USPSTF guidelines to reduce obesity and cardiovascular risk factors.     Please refer to Patient Instructions for Blood Glucose Monitoring and Insulin/Medications Dosing Guide"  in media tab for additional information. Please  also refer to " Patient Self Inventory" in the Media  tab for reviewed elements of pertinent patient history.  Jackqulyn Livings participated in the discussions, expressed understanding, and voiced agreement with the above plans.  All questions were answered to his satisfaction. he is encouraged to contact clinic should he have any questions or concerns prior to his return visit.     Follow up plan: - Return in about 3 months (around 12/19/2022) for Diabetes F/U with A1c in office, No previsit labs, Bring meter and logs.   Ronny Bacon, [facility] [address] [place], [place] [address] Phone: [phone] Fax: [phone]  09/19/2022, 10:18 AM

## 2022-11-08 ENCOUNTER — Other Ambulatory Visit: Payer: Self-pay | Admitting: *Deleted

## 2022-11-08 DIAGNOSIS — E1165 Type 2 diabetes mellitus with hyperglycemia: Secondary | ICD-10-CM

## 2022-11-08 MED ORDER — OZEMPIC (0.25 OR 0.5 MG/DOSE) 2 MG/1.5ML ~~LOC~~ SOPN: 0.5000 mg | PEN_INJECTOR | SUBCUTANEOUS | 3 refills | Status: DC

## 2022-11-08 NOTE — Telephone Encounter (Signed)
Patient left a message that he was in need of a refill on his Ozempic, a refill request was sent to the [facility] on [address] in [place], [place].

## 2022-12-26 ENCOUNTER — Ambulatory Visit (INDEPENDENT_AMBULATORY_CARE_PROVIDER_SITE_OTHER): Payer: Federal, State, Local not specified - PPO | Admitting: Nurse Practitioner

## 2022-12-26 ENCOUNTER — Encounter: Payer: Self-pay | Admitting: Nurse Practitioner

## 2022-12-26 VITALS — BP 121/82 | HR 72 | Ht 74.0 in | Wt 235.6 lb

## 2022-12-26 DIAGNOSIS — I10 Essential (primary) hypertension: Secondary | ICD-10-CM | POA: Diagnosis not present

## 2022-12-26 DIAGNOSIS — Z7985 Long-term (current) use of injectable non-insulin antidiabetic drugs: Secondary | ICD-10-CM | POA: Diagnosis not present

## 2022-12-26 DIAGNOSIS — E782 Mixed hyperlipidemia: Secondary | ICD-10-CM

## 2022-12-26 DIAGNOSIS — L659 Nonscarring hair loss, unspecified: Secondary | ICD-10-CM

## 2022-12-26 DIAGNOSIS — R5382 Chronic fatigue, unspecified: Secondary | ICD-10-CM

## 2022-12-26 DIAGNOSIS — E1165 Type 2 diabetes mellitus with hyperglycemia: Secondary | ICD-10-CM | POA: Diagnosis not present

## 2022-12-26 LAB — POCT GLYCOSYLATED HEMOGLOBIN (HGB A1C): Hemoglobin A1C: 9.3 % — AB (ref 4.0–5.6)

## 2022-12-26 MED ORDER — SEMAGLUTIDE (1 MG/DOSE) 4 MG/3ML ~~LOC~~ SOPN: 1.0000 mg | PEN_INJECTOR | SUBCUTANEOUS | 1 refills | Status: DC

## 2022-12-26 NOTE — Progress Notes (Signed)
Endocrinology Follow Up Note       12/26/2022, 10:56 AM   Subjective:    Patient ID: Paul Barnes, male    DOB: Jan 02, 1966.  Paul Barnes is being seen in follow up after being seen in consultation for management of currently uncontrolled symptomatic diabetes requested by  Tommie Sams, DO.   Past Medical History:  Diagnosis Date   Allergic rhinitis    Allergy    Arthritis    Diabetes mellitus without complication (HCC)    History of kidney stones    Hyperlipidemia    not on meds    Past Surgical History:  Procedure Laterality Date   KNEE ARTHROSCOPY WITH MEDIAL MENISECTOMY Right 02/11/2014   Procedure: KNEE ARTHROSCOPY WITH MEDIAL MENISECTOMY AND CHONDROPLASTY;  Surgeon: Vickki Hearing, MD;  Location: [facility];  Service: Orthopedics;  Laterality: Right;   KNEE ARTHROSCOPY WITH MEDIAL MENISECTOMY Left 01/15/2018   Procedure: Chondroplasty medial  condyle  and trochlea left knee;  Surgeon: Vickki Hearing, MD;  Location: [facility];  Service: Orthopedics;  Laterality: Left;   PLANTAR FASCIA RELEASE Right 11/09/2014   Procedure: ENDOSCOPIC PLANTAR FASCIOTOMY;  Surgeon: Ferman Hamming, DPM;  Location: [facility];  Service: Podiatry;  Laterality: Right;   SHOULDER ARTHROSCOPY Right     Social History   Socioeconomic History   Marital status: Married    Spouse name: Not on file   Number of children: Not on file   Years of education: Not on file   Highest education level: Not on file  Occupational History   Occupation: Curator  Tobacco Use   Smoking status: Never   Smokeless tobacco: Former    Types: Chew    Quit date: 01/08/2017  Vaping Use   Vaping status: Never Used  Substance and Sexual Activity   Alcohol use: Yes    Comment: occassional   Drug use: No   Sexual activity: Yes    Birth control/protection: None  Other Topics Concern   Not on file  Social History Narrative   Not on file    Social Determinants of Health   Financial Resource Strain: Not on file  Food Insecurity: Not on file  Transportation Needs: Not on file  Physical Activity: Not on file  Stress: Not on file  Social Connections: Not on file    Family History  Problem Relation Age of Onset   Heart disease Unknown    Cancer Unknown    Diabetes Unknown    Cancer Mother    Heart disease Father    Colon cancer Neg Hx    Colon polyps Neg Hx    Esophageal cancer Neg Hx    Stomach cancer Neg Hx    Rectal cancer Neg Hx     Outpatient Encounter Medications as of 12/26/2022  Medication Sig   Accu-Chek Softclix Lancets lancets Use to check sugars every day   albuterol (PROVENTIL HFA;VENTOLIN HFA) 108 (90 Base) MCG/ACT inhaler INHALE 2 PUFFS INTO THE LUNGS EVERY 6 HOURS AS NEEDED FOR WHEEZING OR SHORTNESS OF BREATH   atorvastatin (LIPITOR) 80 MG tablet Take 1 tablet (80 mg total) by mouth daily.   Cholecalciferol (VITAMIN D3) 25 MCG (  1000 UT) CAPS Take 1,000 Units by mouth daily.   diclofenac (VOLTAREN) 75 MG EC tablet TAKE 1 TABLET(75 MG) BY MOUTH TWICE DAILY WITH A MEAL   fluticasone (FLONASE) 50 MCG/ACT nasal spray Place into both nostrils daily.   glucose blood (ACCU-CHEK GUIDE) test strip USE TO TEST BLOOD SUGAR twice daily   Melatonin 5 MG TABS Take 5 mg by mouth at bedtime as needed (sleep).   Multiple Vitamin (MULTIVITAMIN WITH MINERALS) TABS tablet Take 1 tablet by mouth daily.   Semaglutide, 1 MG/DOSE, 4 MG/3ML SOPN Inject 1 mg as directed once a week.   tadalafil (CIALIS) 20 MG tablet Take 1 tablet (20 mg total) by mouth daily as needed for erectile dysfunction.   [DISCONTINUED] Semaglutide,0.25 or 0.5MG /DOS, (OZEMPIC, 0.25 OR 0.5 MG/DOSE,) 2 MG/1.5ML SOPN Inject 0.5 mg into the skin once a week.   No facility-administered encounter medications on file as of 12/26/2022.    ALLERGIES: Allergies  Allergen Reactions   Ozempic (0.25 Or 0.5 Mg-Dose) [Semaglutide(0.25 Or 0.5mg -Dos)]     Patient  started having GI issues, Gallbladder attack    VACCINATION STATUS:  There is no immunization history on file for this patient.  Diabetes He presents for his follow-up diabetic visit. He has type 2 diabetes mellitus. Onset time: Diagnosed at approx age of 21. His disease course has been stable. There are no hypoglycemic associated symptoms. Associated symptoms include fatigue and foot paresthesias. Pertinent negatives for diabetes include no blurred vision, no polydipsia and no polyuria. There are no hypoglycemic complications. Symptoms are stable. Diabetic complications include nephropathy. Risk factors for coronary artery disease include diabetes mellitus, dyslipidemia, hypertension and male sex. When asked about current treatments, none were reported. Compliance with diabetes treatment: had trouble getting Ozempic due to supply issues. His weight is fluctuating minimally. He is following a generally healthy diet. When asked about meal planning, he reported none. He has not had a previous visit with a dietitian. He participates in exercise intermittently. His home blood glucose trend is fluctuating minimally. His breakfast blood glucose range is generally 140-180 mg/dl. (He presents today with no meter or logs to review.  He notes his fasting glucose ranges between 130-180 most days.  His POCT A1c today is 9.3%, up slightly from last visit of 9.1%.  He notes he has not had any issues with restarting his Ozempic.  ) An ACE inhibitor/angiotensin II receptor blocker is not being taken. He does not see a podiatrist.Eye exam is current.  Hyperlipidemia This is a chronic problem. The current episode started more than 1 year ago. The problem is uncontrolled. Recent lipid tests were reviewed and are high. Exacerbating diseases include chronic renal disease and diabetes. Factors aggravating his hyperlipidemia include fatty foods. Current antihyperlipidemic treatment includes statins. Compliance problems include  adherence to diet, adherence to exercise and medication side effects (has myalgias with statin).  Risk factors for coronary artery disease include diabetes mellitus, dyslipidemia, family history, male sex and hypertension.  Hypertension This is a chronic problem. The current episode started more than 1 year ago. The problem has been resolved since onset. The problem is controlled. Pertinent negatives include no blurred vision. There are no associated agents to hypertension. Risk factors for coronary artery disease include diabetes mellitus, dyslipidemia, family history and male gender. Past treatments include nothing. Compliance problems include diet and exercise.  Hypertensive end-organ damage includes kidney disease. Identifiable causes of hypertension include chronic renal disease.     Review of systems  Constitutional: + minimally  fluctuating body weight,  current Body mass index is 30.25 kg/m. , no fatigue, no subjective hyperthermia, no subjective hypothermia Eyes: no blurry vision, no xerophthalmia ENT: no sore throat, no nodules palpated in throat, no dysphagia/odynophagia, no hoarseness Cardiovascular: no chest pain, no shortness of breath, no palpitations, no leg swelling Respiratory: no cough, no shortness of breath Gastrointestinal: no nausea/vomiting/diarrhea Musculoskeletal: no muscle/joint aches Skin: no rashes, no hyperemia, + hair loss Neurological: no tremors, no numbness, no tingling, no dizziness Psychiatric: no depression, no anxiety  Objective:     BP 121/82 (BP Location: Right Arm, Patient Position: Sitting, Cuff Size: Large)   Pulse 72   Ht 6\' 2"  (1.88 m)   Wt 235 lb 9.6 oz (106.9 kg)   BMI 30.25 kg/m   Wt Readings from Last 3 Encounters:  12/26/22 235 lb 9.6 oz (106.9 kg)  09/19/22 234 lb 6.4 oz (106.3 kg)  07/24/22 232 lb (105.2 kg)     BP Readings from Last 3 Encounters:  12/26/22 121/82  09/19/22 126/86  07/24/22 116/82      Physical Exam-  Limited  Constitutional:  Body mass index is 30.25 kg/m. , not in acute distress, normal state of mind Eyes:  EOMI, no exophthalmos Musculoskeletal: no gross deformities, strength intact in all four extremities, no gross restriction of joint movements Skin:  no rashes, no hyperemia, + patchy hair loss to head and face (missing his right eyebrow) Neurological: no tremor with outstretched hands  Diabetic Foot Exam - Simple   No data filed     CMP ( most recent) CMP     Component Value Date/Time   NA 139 07/24/2022 0940   K 4.8 07/24/2022 0940   CL 101 07/24/2022 0940   CO2 22 07/24/2022 0940   GLUCOSE 251 (H) 07/24/2022 0940   GLUCOSE 154 (H) 01/08/2018 1357   BUN 17 07/24/2022 0940   CREATININE 1.17 07/24/2022 0940   CALCIUM 10.1 07/24/2022 0940   PROT 6.9 07/24/2022 0940   ALBUMIN 4.6 07/24/2022 0940   AST 20 07/24/2022 0940   ALT 62 (H) 07/24/2022 0940   ALKPHOS 139 (H) 07/24/2022 0940   BILITOT 0.8 07/24/2022 0940   GFRNONAA 79 09/27/2019 0858   GFRAA 91 09/27/2019 0858     Diabetic Labs (most recent): Lab Results  Component Value Date   HGBA1C 9.3 (A) 12/26/2022   HGBA1C 9.1 (H) 07/24/2022   HGBA1C 6.7 (H) 02/15/2022   MICROALBUR 30 09/10/2021     Lipid Panel ( most recent) Lipid Panel     Component Value Date/Time   CHOL 213 (H) 07/24/2022 0940   TRIG 115 07/24/2022 0940   HDL 56 07/24/2022 0940   CHOLHDL 3.8 07/24/2022 0940   LDLCALC 137 (H) 07/24/2022 0940   LABVLDL 20 07/24/2022 0940      No results found for: "TSH", "FREET4"         Assessment & Plan:   1) Type 2 diabetes mellitus with hyperglycemia, without long-term current use of insulin (HCC)  He presents today with no meter or logs to review.  He notes his fasting glucose ranges between 130-180 most days.  His POCT A1c today is 9.3%, up slightly from last visit of 9.1%.  He notes he has not had any issues with restarting his Ozempic.    - Paul Barnes has currently uncontrolled  symptomatic type 2 DM since 57 years of age.   -Recent labs reviewed.  - I had a long discussion with him about  the progressive nature of diabetes and the pathology behind its complications. -his diabetes is complicated by mild CKD and neuropathy and he remains at a high risk for more acute and chronic complications which include CAD, CVA, CKD, retinopathy, and neuropathy. These are all discussed in detail with him.  - Nutritional counseling repeated at each appointment due to patients tendency to fall back in to old habits.  - The patient admits there is a room for improvement in their diet and drink choices. -  Suggestion is made for the patient to avoid simple carbohydrates from their diet including Cakes, Sweet Desserts / Pastries, Ice Cream, Soda (diet and regular), Sweet Tea, Candies, Chips, Cookies, Sweet Pastries, Store Bought Juices, Alcohol in Excess of 1-2 drinks a day, Artificial Sweeteners, Coffee Creamer, and "Sugar-free" Products. This will help patient to have stable blood glucose profile and potentially avoid unintended weight gain.   - I encouraged the patient to switch to unprocessed or minimally processed complex starch and increased protein intake (animal or plant source), fruits, and vegetables.   - Patient is advised to stick to a routine mealtimes to eat 3 meals a day and avoid unnecessary snacks (to snack only to correct hypoglycemia).  - he will be scheduled with Norm Salt, RDN, CDE for diabetes education.  - I have approached him with the following individualized plan to manage his diabetes and patient agrees:   -He has done well with reinitiating Ozempic, will increase to 1 mg SQ weekly.  He is aware to stop the medication and call me if he has any unpleasant side effects.  We also discussed the potential need to add back Metformin at next visit if A1c has not improved.    -he is advised to monitor glucose once daily, before breakfast and any other time if  experiencing symptoms and to call the clinic if he has readings less than 70 or above 300 for 3 tests in a row.  - Adjustment parameters are given to him for hypo and hyperglycemia in writing.  - Specific targets for  A1c; LDL, HDL, and Triglycerides were discussed with the patient.  2) Blood Pressure /Hypertension:  his blood pressure is controlled to target without the use of antihypertensive medications.  He will be considered for low dose ACE/ARB on subsequent visits if BP is above 140/90 on 3 separate occasions.  3) Lipids/Hyperlipidemia:    Review of his recent lipid panel from 07/24/22 showed uncontrolled LDL at 137 (worsening) but greatly improved triglycerides. He does report significant myalgias with his statin, therefore he hopes to be taken off it in the future with lifestyle changes.  4)  Weight/Diet:  his Body mass index is 30.25 kg/m.  -  clearly complicating his diabetes care.   he is a candidate for weight loss. I discussed with him the fact that loss of 5 - 10% of his  current body weight will have the most impact on his diabetes management.  Exercise, and detailed carbohydrates information provided  -  detailed on discharge instructions.  5) Chronic Care/Health Maintenance: -he is not on ACEI/ARB or Statin medications and is encouraged to initiate and continue to follow up with Ophthalmology, Dentist, Podiatrist at least yearly or according to recommendations, and advised to stay away from smoking. I have recommended yearly flu vaccine and pneumonia vaccine at least every 5 years; moderate intensity exercise for up to 150 minutes weekly; and sleep for at least 7 hours a day.  - he is advised to  maintain close follow up with Tommie Sams, DO for primary care needs, as well as his other providers for optimal and coordinated care.  -Given the patchy hair loss and fatigue, will check full thyroid panel to assess for any dysfunction.     I spent  41  minutes in the care of the  patient today including review of labs from CMP, Lipids, Thyroid Function, Hematology (current and previous including abstractions from other facilities); face-to-face time discussing  his blood glucose readings/logs, discussing hypoglycemia and hyperglycemia episodes and symptoms, medications doses, his options of short and long term treatment based on the latest standards of care / guidelines;  discussion about incorporating lifestyle medicine;  and documenting the encounter. Risk reduction counseling performed per USPSTF guidelines to reduce obesity and cardiovascular risk factors.     Please refer to Patient Instructions for Blood Glucose Monitoring and Insulin/Medications Dosing Guide"  in media tab for additional information. Please  also refer to " Patient Self Inventory" in the Media  tab for reviewed elements of pertinent patient history.  Jackqulyn Livings participated in the discussions, expressed understanding, and voiced agreement with the above plans.  All questions were answered to his satisfaction. he is encouraged to contact clinic should he have any questions or concerns prior to his return visit.     Follow up plan: - Return in about 3 months (around 03/28/2023) for Diabetes F/U with A1c in office, No previsit labs.   Ronny Bacon, [facility] [address] [place], [place] [address] Phone: [phone] Fax: [phone]  12/26/2022, 10:56 AM

## 2023-01-21 ENCOUNTER — Other Ambulatory Visit: Payer: Self-pay | Admitting: *Deleted

## 2023-01-21 DIAGNOSIS — E1165 Type 2 diabetes mellitus with hyperglycemia: Secondary | ICD-10-CM

## 2023-01-21 MED ORDER — SEMAGLUTIDE (1 MG/DOSE) 4 MG/3ML ~~LOC~~ SOPN
1.0000 mg | PEN_INJECTOR | SUBCUTANEOUS | 1 refills | Status: DC
Start: 2023-01-21 — End: 2023-05-15

## 2023-01-21 NOTE — Telephone Encounter (Signed)
Patient called for a refill on his Ozempic. This will be sent to the [facility] on [address].

## 2023-01-22 ENCOUNTER — Ambulatory Visit: Payer: BC Managed Care – PPO | Admitting: Family Medicine

## 2023-02-17 ENCOUNTER — Ambulatory Visit: Payer: BC Managed Care – PPO | Admitting: Family Medicine

## 2023-02-24 ENCOUNTER — Ambulatory Visit (INDEPENDENT_AMBULATORY_CARE_PROVIDER_SITE_OTHER): Payer: BC Managed Care – PPO | Admitting: Family Medicine

## 2023-02-24 VITALS — BP 124/83 | HR 72 | Temp 98.0°F | Ht 74.0 in | Wt 234.6 lb

## 2023-02-24 DIAGNOSIS — L659 Nonscarring hair loss, unspecified: Secondary | ICD-10-CM

## 2023-02-24 DIAGNOSIS — E119 Type 2 diabetes mellitus without complications: Secondary | ICD-10-CM | POA: Diagnosis not present

## 2023-02-24 DIAGNOSIS — E782 Mixed hyperlipidemia: Secondary | ICD-10-CM

## 2023-02-24 DIAGNOSIS — Z1211 Encounter for screening for malignant neoplasm of colon: Secondary | ICD-10-CM

## 2023-02-24 DIAGNOSIS — Z7985 Long-term (current) use of injectable non-insulin antidiabetic drugs: Secondary | ICD-10-CM | POA: Diagnosis not present

## 2023-02-24 NOTE — Patient Instructions (Signed)
Labs ordered.  I have put in another referral to dermatology.  I will refer to GI for colonoscopy as well.  Take care  Dr. Adriana Simas

## 2023-02-25 NOTE — Assessment & Plan Note (Signed)
Referring to dermatology at [facility].

## 2023-02-25 NOTE — Assessment & Plan Note (Signed)
Assessing today with lipid panel.

## 2023-02-25 NOTE — Progress Notes (Signed)
Subjective:  Patient ID: Paul Barnes, male    DOB: [birth year]  Age: 57 y.o. MRN: [id number]  CC: Follow up   HPI:  57 year old male presents for follow up.  Patient is having ongoing issues with alopecia.  I previously referred him to dermatology but he has not heard back.  Will discuss this today.  Planning on referring to [facility] as I think he will be seen in a timely manner and they seem to do a good job.  Type 2 diabetes remains uncontrolled.  Last A1c 9.3.  Follows with Endo.  He is on semaglutide.  Dose was recently increased to 1 mg.  Patient previously had some issues with abdominal pain and gallstones.  This has resolved.  Concerned that this may have come from semaglutide.  Currently he is tolerating.  Patient's last lipid panel was uncontrolled.  He did not tolerate Lipitor due to joint aches.  Needs reevaluation of lipids.  Patient is overdue for colonoscopy.  Amendable to referral back.  Patient Active Problem List   Diagnosis Date Noted   Alopecia 07/24/2022   Insomnia 10/16/2021   Hyperlipidemia 07/19/2021   Erectile dysfunction 07/19/2021   Osteoarthritis of both knees 07/19/2021   Umbilical hernia 07/19/2021   S/P left knee arthroscopy 01/15/18  01/22/2018   Type 2 diabetes mellitus without complications (HCC) 09/18/2013    Social Hx   Social History   Socioeconomic History   Marital status: Married    Spouse name: Not on file   Number of children: Not on file   Years of education: Not on file   Highest education level: Not on file  Occupational History   Occupation: Curator  Tobacco Use   Smoking status: Never   Smokeless tobacco: Former    Types: Chew    Quit date: 01/08/2017  Vaping Use   Vaping status: Never Used  Substance and Sexual Activity   Alcohol use: Yes    Comment: occassional   Drug use: No   Sexual activity: Yes    Birth control/protection: None  Other Topics Concern   Not on file  Social History Narrative   Not on file    Social Determinants of Health   Financial Resource Strain: Not on file  Food Insecurity: Not on file  Transportation Needs: Not on file  Physical Activity: Not on file  Stress: Not on file  Social Connections: Not on file    Review of Systems Per HPI  Objective:  BP 124/83   Pulse 72   Temp 98 F (36.7 C) (Oral)   Ht 6\' 2"  (1.88 m)   Wt 234 lb 9.6 oz (106.4 kg)   SpO2 98%   BMI 30.12 kg/m      02/24/2023   10:33 AM 12/26/2022    9:26 AM 09/19/2022    9:33 AM  BP/Weight  Systolic BP 124 121 126  Diastolic BP 83 82 86  Wt. (Lbs) 234.6 235.6 234.4  BMI 30.12 kg/m2 30.25 kg/m2 30.1 kg/m2    Physical Exam Constitutional:      General: He is not in acute distress.    Appearance: Normal appearance.  HENT:     Head:     Comments: Areas of circumferential hair loss consistent with alopecia. Eyes:     General:        Right eye: No discharge.        Left eye: No discharge.     Conjunctiva/sclera: Conjunctivae normal.  Cardiovascular:  Rate and Rhythm: Normal rate and regular rhythm.  Pulmonary:     Effort: Pulmonary effort is normal.     Breath sounds: Normal breath sounds. No wheezing, rhonchi or rales.  Abdominal:     General: There is no distension.     Palpations: Abdomen is soft.     Tenderness: There is no abdominal tenderness.  Neurological:     Mental Status: He is alert.  Psychiatric:        Mood and Affect: Mood normal.        Behavior: Behavior normal.     Lab Results  Component Value Date   WBC 6.6 07/24/2022   HGB 16.2 07/24/2022   HCT 48.7 07/24/2022   PLT 322 07/24/2022   GLUCOSE 251 (H) 07/24/2022   CHOL 213 (H) 07/24/2022   TRIG 115 07/24/2022   HDL 56 07/24/2022   LDLCALC 137 (H) 07/24/2022   ALT 62 (H) 07/24/2022   AST 20 07/24/2022   NA 139 07/24/2022   K 4.8 07/24/2022   CL 101 07/24/2022   CREATININE 1.17 07/24/2022   BUN 17 07/24/2022   CO2 22 07/24/2022   HGBA1C 9.3 (A) 12/26/2022   MICROALBUR 30 09/10/2021      Assessment & Plan:   Problem List Items Addressed This Visit       Endocrine   Type 2 diabetes mellitus without complications (HCC)    Uncontrolled.  Following with Endo. Continue semaglutide.      Relevant Orders   CMP14+EGFR     Musculoskeletal and Integument   Alopecia - Primary    Referring to dermatology at [facility].      Relevant Orders   TSH + free T4   T3, Free   Ambulatory referral to Dermatology     Other   Hyperlipidemia    Assessing today with lipid panel.      Relevant Orders   Lipid panel   Other Visit Diagnoses     Encounter for screening colonoscopy       Relevant Orders   Ambulatory referral to Gastroenterology       Follow-up:  Return in about 6 months (around 08/24/2023).  Everlene Other DO [facility]

## 2023-02-25 NOTE — Assessment & Plan Note (Addendum)
Uncontrolled.  Following with Endo. Continue semaglutide.

## 2023-04-01 ENCOUNTER — Ambulatory Visit: Payer: BC Managed Care – PPO | Admitting: Nurse Practitioner

## 2023-04-01 DIAGNOSIS — E1165 Type 2 diabetes mellitus with hyperglycemia: Secondary | ICD-10-CM

## 2023-04-01 DIAGNOSIS — I10 Essential (primary) hypertension: Secondary | ICD-10-CM

## 2023-04-01 DIAGNOSIS — E782 Mixed hyperlipidemia: Secondary | ICD-10-CM

## 2023-04-01 DIAGNOSIS — Z7985 Long-term (current) use of injectable non-insulin antidiabetic drugs: Secondary | ICD-10-CM

## 2023-05-08 ENCOUNTER — Other Ambulatory Visit: Payer: Self-pay | Admitting: Family Medicine

## 2023-05-08 DIAGNOSIS — E782 Mixed hyperlipidemia: Secondary | ICD-10-CM | POA: Diagnosis not present

## 2023-05-08 DIAGNOSIS — L659 Nonscarring hair loss, unspecified: Secondary | ICD-10-CM | POA: Diagnosis not present

## 2023-05-08 DIAGNOSIS — E119 Type 2 diabetes mellitus without complications: Secondary | ICD-10-CM | POA: Diagnosis not present

## 2023-05-09 ENCOUNTER — Other Ambulatory Visit (HOSPITAL_COMMUNITY): Payer: Self-pay

## 2023-05-09 ENCOUNTER — Other Ambulatory Visit: Payer: Self-pay

## 2023-05-09 LAB — CMP14+EGFR
ALT: 17 [IU]/L (ref 0–44)
AST: 12 [IU]/L (ref 0–40)
Albumin: 4.6 g/dL (ref 3.8–4.9)
Alkaline Phosphatase: 98 [IU]/L (ref 44–121)
BUN/Creatinine Ratio: 14 (ref 9–20)
BUN: 15 mg/dL (ref 6–24)
Bilirubin Total: 1 mg/dL (ref 0.0–1.2)
CO2: 22 mmol/L (ref 20–29)
Calcium: 9.7 mg/dL (ref 8.7–10.2)
Chloride: 98 mmol/L (ref 96–106)
Creatinine, Ser: 1.04 mg/dL (ref 0.76–1.27)
Globulin, Total: 2.3 g/dL (ref 1.5–4.5)
Glucose: 289 mg/dL — ABNORMAL HIGH (ref 70–99)
Potassium: 4.4 mmol/L (ref 3.5–5.2)
Sodium: 137 mmol/L (ref 134–144)
Total Protein: 6.9 g/dL (ref 6.0–8.5)
eGFR: 83 mL/min/{1.73_m2} (ref >59)

## 2023-05-09 LAB — LIPID PANEL
Chol/HDL Ratio: 5.7 {ratio} — ABNORMAL HIGH (ref 0.0–5.0)
Cholesterol, Total: 272 mg/dL — ABNORMAL HIGH (ref 100–199)
HDL: 48 mg/dL (ref >39)
LDL Chol Calc (NIH): 171 mg/dL — ABNORMAL HIGH (ref 0–99)
Triglycerides: 280 mg/dL — ABNORMAL HIGH (ref 0–149)
VLDL Cholesterol Cal: 53 mg/dL — ABNORMAL HIGH (ref 5–40)

## 2023-05-09 LAB — TSH+FREE T4
Free T4: 1.19 ng/dL (ref 0.82–1.77)
TSH: 2.24 u[IU]/mL (ref 0.450–4.500)

## 2023-05-09 LAB — T3, FREE: T3, Free: 3.5 pg/mL (ref 2.0–4.4)

## 2023-05-09 MED ORDER — FLUTICASONE PROPIONATE 50 MCG/ACT NA SUSP
2.0000 | Freq: Every day | NASAL | 0 refills | Status: DC
  Filled 2023-05-09: qty 16, 30d supply, fill #0

## 2023-05-13 ENCOUNTER — Encounter: Payer: Self-pay | Admitting: *Deleted

## 2023-05-14 ENCOUNTER — Other Ambulatory Visit: Payer: Self-pay

## 2023-05-15 ENCOUNTER — Ambulatory Visit (INDEPENDENT_AMBULATORY_CARE_PROVIDER_SITE_OTHER): Payer: BC Managed Care – PPO | Admitting: Nurse Practitioner

## 2023-05-15 ENCOUNTER — Encounter: Payer: Self-pay | Admitting: Nurse Practitioner

## 2023-05-15 VITALS — BP 138/75 | HR 78 | Ht 74.0 in | Wt 235.0 lb

## 2023-05-15 DIAGNOSIS — Z7985 Long-term (current) use of injectable non-insulin antidiabetic drugs: Secondary | ICD-10-CM

## 2023-05-15 DIAGNOSIS — I10 Essential (primary) hypertension: Secondary | ICD-10-CM | POA: Diagnosis not present

## 2023-05-15 DIAGNOSIS — E782 Mixed hyperlipidemia: Secondary | ICD-10-CM | POA: Diagnosis not present

## 2023-05-15 DIAGNOSIS — E1165 Type 2 diabetes mellitus with hyperglycemia: Secondary | ICD-10-CM | POA: Diagnosis not present

## 2023-05-15 DIAGNOSIS — R5382 Chronic fatigue, unspecified: Secondary | ICD-10-CM

## 2023-05-15 LAB — POCT GLYCOSYLATED HEMOGLOBIN (HGB A1C): Hemoglobin A1C: 9.9 % — AB (ref 4.0–5.6)

## 2023-05-15 MED ORDER — SITAGLIPTIN PHOSPHATE 50 MG PO TABS: 50.0000 mg | ORAL_TABLET | Freq: Every day | ORAL | 1 refills | Status: DC

## 2023-05-15 NOTE — Progress Notes (Signed)
Endocrinology Follow Up Note       05/15/2023, 8:29 AM   Subjective:    Patient ID: Paul Barnes, male    DOB: Mar 03, 1966.  Paul Barnes is being seen in follow up after being seen in consultation for management of currently uncontrolled symptomatic diabetes requested by  Tommie Sams, DO.   Past Medical History:  Diagnosis Date   Allergic rhinitis    Allergy    Arthritis    Diabetes mellitus without complication (HCC)    History of kidney stones    Hyperlipidemia    not on meds    Past Surgical History:  Procedure Laterality Date   KNEE ARTHROSCOPY WITH MEDIAL MENISECTOMY Right 02/11/2014   Procedure: KNEE ARTHROSCOPY WITH MEDIAL MENISECTOMY AND CHONDROPLASTY;  Surgeon: Vickki Hearing, MD;  Location: [facility];  Service: Orthopedics;  Laterality: Right;   KNEE ARTHROSCOPY WITH MEDIAL MENISECTOMY Left 01/15/2018   Procedure: Chondroplasty medial  condyle  and trochlea left knee;  Surgeon: Vickki Hearing, MD;  Location: [facility];  Service: Orthopedics;  Laterality: Left;   PLANTAR FASCIA RELEASE Right 11/09/2014   Procedure: ENDOSCOPIC PLANTAR FASCIOTOMY;  Surgeon: Ferman Hamming, DPM;  Location: [facility];  Service: Podiatry;  Laterality: Right;   SHOULDER ARTHROSCOPY Right     Social History   Socioeconomic History   Marital status: Married    Spouse name: Not on file   Number of children: Not on file   Years of education: Not on file   Highest education level: Not on file  Occupational History   Occupation: Curator  Tobacco Use   Smoking status: Never   Smokeless tobacco: Former    Types: Chew    Quit date: 01/08/2017  Vaping Use   Vaping status: Never Used  Substance and Sexual Activity   Alcohol use: Yes    Comment: occassional   Drug use: No   Sexual activity: Yes    Birth control/protection: None  Other Topics Concern   Not on file  Social History Narrative   Not on file    Social Drivers of Health   Financial Resource Strain: Not on file  Food Insecurity: Not on file  Transportation Needs: Not on file  Physical Activity: Not on file  Stress: Not on file  Social Connections: Not on file    Family History  Problem Relation Age of Onset   Heart disease Unknown    Cancer Unknown    Diabetes Unknown    Cancer Mother    Heart disease Father    Colon cancer Neg Hx    Colon polyps Neg Hx    Esophageal cancer Neg Hx    Stomach cancer Neg Hx    Rectal cancer Neg Hx     Outpatient Encounter Medications as of 05/15/2023  Medication Sig   Accu-Chek Softclix Lancets lancets Use to check sugars every day   albuterol (PROVENTIL HFA;VENTOLIN HFA) 108 (90 Base) MCG/ACT inhaler INHALE 2 PUFFS INTO THE LUNGS EVERY 6 HOURS AS NEEDED FOR WHEEZING OR SHORTNESS OF BREATH   Cholecalciferol (VITAMIN D3) 25 MCG (1000 UT) CAPS Take 1,000 Units by mouth daily.   fluticasone (FLONASE) 50 MCG/ACT nasal  spray Place 2 sprays into both nostrils daily.   glucose blood (ACCU-CHEK GUIDE) test strip USE TO TEST BLOOD SUGAR twice daily   Melatonin 5 MG TABS Take 5 mg by mouth at bedtime as needed (sleep).   Multiple Vitamin (MULTIVITAMIN WITH MINERALS) TABS tablet Take 1 tablet by mouth daily.   sitaGLIPtin (JANUVIA) 50 MG tablet Take 1 tablet (50 mg total) by mouth daily.   tadalafil (CIALIS) 20 MG tablet Take 1 tablet (20 mg total) by mouth daily as needed for erectile dysfunction.   [DISCONTINUED] Semaglutide, 1 MG/DOSE, 4 MG/3ML SOPN Inject 1 mg as directed once a week. (Patient not taking: Reported on 05/15/2023)   No facility-administered encounter medications on file as of 05/15/2023.    ALLERGIES: Allergies  Allergen Reactions   Ozempic (0.25 Or 0.5 Mg-Dose) [Semaglutide(0.25 Or 0.5mg -Dos)]     Patient started having GI issues, Gallbladder attack    VACCINATION STATUS:  There is no immunization history on file for this patient.  Diabetes He presents for his  follow-up diabetic visit. He has type 2 diabetes mellitus. Onset time: Diagnosed at approx age of 89. His disease course has been stable. There are no hypoglycemic associated symptoms. Associated symptoms include fatigue and foot paresthesias. Pertinent negatives for diabetes include no blurred vision, no polydipsia and no polyuria. There are no hypoglycemic complications. Symptoms are stable. Diabetic complications include nephropathy. Risk factors for coronary artery disease include diabetes mellitus, dyslipidemia, hypertension and male sex. When asked about current treatments, none (stopped his Ozempic 3-4 weeks ago due to GI SE) were reported. His weight is fluctuating minimally. He is following a generally healthy diet. When asked about meal planning, he reported none. He has not had a previous visit with a dietitian. He participates in exercise intermittently. (He presents today with no meter or logs to review.  He notes his fasting glucose has been averaging over 200 most days but this morning was 187.  His POCT A1c today is 9.9%, up slightly from last visit of 9.3%.  He notes he stopped his Ozempic 3-4 weeks ago due to GI side effects.  He notes he recently got a new bed and is sleeping better which he sees improvement in his glucose as a result.  Previously he would stay up and snack and that has improved some now.) An ACE inhibitor/angiotensin II receptor blocker is not being taken. He does not see a podiatrist.Eye exam is current.  Hyperlipidemia This is a chronic problem. The current episode started more than 1 year ago. The problem is uncontrolled. Recent lipid tests were reviewed and are high. Exacerbating diseases include chronic renal disease and diabetes. Factors aggravating his hyperlipidemia include fatty foods. Current antihyperlipidemic treatment includes statins. Compliance problems include adherence to diet, adherence to exercise and medication side effects (has myalgias with statin).  Risk  factors for coronary artery disease include diabetes mellitus, dyslipidemia, family history, male sex and hypertension.  Hypertension This is a chronic problem. The current episode started more than 1 year ago. The problem has been resolved since onset. The problem is controlled. Pertinent negatives include no blurred vision. There are no associated agents to hypertension. Risk factors for coronary artery disease include diabetes mellitus, dyslipidemia, family history and male gender. Past treatments include nothing. Compliance problems include diet and exercise.  Hypertensive end-organ damage includes kidney disease. Identifiable causes of hypertension include chronic renal disease.     Review of systems  Constitutional: + minimally fluctuating body weight,  current Body mass index  is 30.17 kg/m. , no fatigue, no subjective hyperthermia, no subjective hypothermia Eyes: no blurry vision, no xerophthalmia ENT: no sore throat, no nodules palpated in throat, no dysphagia/odynophagia, no hoarseness Cardiovascular: no chest pain, no shortness of breath, no palpitations, no leg swelling Respiratory: no cough, no shortness of breath Gastrointestinal: no nausea/vomiting/diarrhea Musculoskeletal: no muscle/joint aches Skin: no rashes, no hyperemia, + hair loss Neurological: no tremors, no numbness, no tingling, no dizziness Psychiatric: no depression, no anxiety, + increased stress  Objective:     BP 138/75 (BP Location: Left Arm, Patient Position: Sitting, Cuff Size: Large)   Pulse 78   Ht 6\' 2"  (1.88 m)   Wt 235 lb (106.6 kg)   BMI 30.17 kg/m   Wt Readings from Last 3 Encounters:  05/15/23 235 lb (106.6 kg)  02/24/23 234 lb 9.6 oz (106.4 kg)  12/26/22 235 lb 9.6 oz (106.9 kg)     BP Readings from Last 3 Encounters:  05/15/23 138/75  02/24/23 124/83  12/26/22 121/82      Physical Exam- Limited  Constitutional:  Body mass index is 30.17 kg/m. , not in acute distress, normal state  of mind Eyes:  EOMI, no exophthalmos Musculoskeletal: no gross deformities, strength intact in all four extremities, no gross restriction of joint movements Skin:  no rashes, no hyperemia, + patchy hair loss to head and face (missing his right eyebrow) has appt with Derm in March Neurological: no tremor with outstretched hands  Diabetic Foot Exam - Simple   No data filed     CMP ( most recent) CMP     Component Value Date/Time   NA 137 05/08/2023 1234   K 4.4 05/08/2023 1234   CL 98 05/08/2023 1234   CO2 22 05/08/2023 1234   GLUCOSE 289 (H) 05/08/2023 1234   GLUCOSE 154 (H) 01/08/2018 1357   BUN 15 05/08/2023 1234   CREATININE 1.04 05/08/2023 1234   CALCIUM 9.7 05/08/2023 1234   PROT 6.9 05/08/2023 1234   ALBUMIN 4.6 05/08/2023 1234   AST 12 05/08/2023 1234   ALT 17 05/08/2023 1234   ALKPHOS 98 05/08/2023 1234   BILITOT 1.0 05/08/2023 1234   GFRNONAA 79 09/27/2019 0858   GFRAA 91 09/27/2019 0858     Diabetic Labs (most recent): Lab Results  Component Value Date   HGBA1C 9.9 (A) 05/15/2023   HGBA1C 9.3 (A) 12/26/2022   HGBA1C 9.1 (H) 07/24/2022   MICROALBUR 30 09/10/2021     Lipid Panel ( most recent) Lipid Panel     Component Value Date/Time   CHOL 272 (H) 05/08/2023 1234   TRIG 280 (H) 05/08/2023 1234   HDL 48 05/08/2023 1234   CHOLHDL 5.7 (H) 05/08/2023 1234   LDLCALC 171 (H) 05/08/2023 1234   LABVLDL 53 (H) 05/08/2023 1234      Lab Results  Component Value Date   TSH 2.240 05/08/2023   FREET4 1.19 05/08/2023           Assessment & Plan:   1) Type 2 diabetes mellitus with hyperglycemia, without long-term current use of insulin (HCC)  He presents today with no meter or logs to review.  He notes his fasting glucose has been averaging over 200 most days but this morning was 187.  His POCT A1c today is 9.9%, up slightly from last visit of 9.3%.  He notes he stopped his Ozempic 3-4 weeks ago due to GI side effects.  He notes he recently got a new  bed and is sleeping better  which he sees improvement in his glucose as a result.  Previously he would stay up and snack and that has improved some now.   Paul Barnes has currently uncontrolled symptomatic type 2 DM since 57 years of age.   -Recent labs reviewed.  - I had a long discussion with him about the progressive nature of diabetes and the pathology behind its complications. -his diabetes is complicated by mild CKD and neuropathy and he remains at a high risk for more acute and chronic complications which include CAD, CVA, CKD, retinopathy, and neuropathy. These are all discussed in detail with him.  - Nutritional counseling repeated at each appointment due to patients tendency to fall back in to old habits.  - The patient admits there is a room for improvement in their diet and drink choices. -  Suggestion is made for the patient to avoid simple carbohydrates from their diet including Cakes, Sweet Desserts / Pastries, Ice Cream, Soda (diet and regular), Sweet Tea, Candies, Chips, Cookies, Sweet Pastries, Store Bought Juices, Alcohol in Excess of 1-2 drinks a day, Artificial Sweeteners, Coffee Creamer, and "Sugar-free" Products. This will help patient to have stable blood glucose profile and potentially avoid unintended weight gain.   - I encouraged the patient to switch to unprocessed or minimally processed complex starch and increased protein intake (animal or plant source), fruits, and vegetables.   - Patient is advised to stick to a routine mealtimes to eat 3 meals a day and avoid unnecessary snacks (to snack only to correct hypoglycemia).  - he will be scheduled with Norm Salt, RDN, CDE for diabetes education.  - I have approached him with the following individualized plan to manage his diabetes and patient agrees:   -Given his GI SE with Ozempic, will stop this medication officially today and trial him on Januvia 50 mg po daily.  We discussed potential of restarting  Metformin (which he was hesitant to do as he felt better after we took him off previously) or basal insulin.    -he is advised to monitor glucose once daily, before breakfast and any other time if experiencing symptoms and to call the clinic if he has readings less than 70 or above 300 for 3 tests in a row.  - Adjustment parameters are given to him for hypo and hyperglycemia in writing.  - Specific targets for  A1c; LDL, HDL, and Triglycerides were discussed with the patient.  2) Blood Pressure /Hypertension:  his blood pressure is controlled to target without the use of antihypertensive medications.  He will be considered for low dose ACE/ARB on subsequent visits if BP is above 140/90 on 3 separate occasions.  3) Lipids/Hyperlipidemia:    Review of his recent lipid panel from 07/24/22 showed uncontrolled LDL at 137 (worsening) but greatly improved triglycerides. He does report significant myalgias with his statin, therefore he hopes to be taken off it in the future with lifestyle changes.  4)  Weight/Diet:  his Body mass index is 30.17 kg/m.  -  clearly complicating his diabetes care.   he is a candidate for weight loss. I discussed with him the fact that loss of 5 - 10% of his  current body weight will have the most impact on his diabetes management.  Exercise, and detailed carbohydrates information provided  -  detailed on discharge instructions.  5) Chronic Care/Health Maintenance: -he is not on ACEI/ARB or Statin medications and is encouraged to initiate and continue to follow up with Ophthalmology, Dentist,  Podiatrist at least yearly or according to recommendations, and advised to stay away from smoking. I have recommended yearly flu vaccine and pneumonia vaccine at least every 5 years; moderate intensity exercise for up to 150 minutes weekly; and sleep for at least 7 hours a day.  - he is advised to maintain close follow up with Tommie Sams, DO for primary care needs, as well as his  other providers for optimal and coordinated care.  -Given the patchy hair loss and fatigue, thyroid panel was checked to assess for any dysfunction.  Thyroid labs were normal (thyroid antibodies were not checked).     I spent  43  minutes in the care of the patient today including review of labs from CMP, Lipids, Thyroid Function, Hematology (current and previous including abstractions from other facilities); face-to-face time discussing  his blood glucose readings/logs, discussing hypoglycemia and hyperglycemia episodes and symptoms, medications doses, his options of short and long term treatment based on the latest standards of care / guidelines;  discussion about incorporating lifestyle medicine;  and documenting the encounter. Risk reduction counseling performed per USPSTF guidelines to reduce obesity and cardiovascular risk factors.     Please refer to Patient Instructions for Blood Glucose Monitoring and Insulin/Medications Dosing Guide"  in media tab for additional information. Please  also refer to " Patient Self Inventory" in the Media  tab for reviewed elements of pertinent patient history.  Paul Barnes participated in the discussions, expressed understanding, and voiced agreement with the above plans.  All questions were answered to his satisfaction. he is encouraged to contact clinic should he have any questions or concerns prior to his return visit.     Follow up plan: - Return in about 3 months (around 08/13/2023) for Diabetes F/U with A1c in office, No previsit labs, Bring meter and logs.   Ronny Bacon, [facility] [address] [place], [place] [address] Phone: [phone] Fax: [phone]  05/15/2023, 8:29 AM

## 2023-07-22 ENCOUNTER — Telehealth: Payer: Self-pay | Admitting: *Deleted

## 2023-07-22 NOTE — Telephone Encounter (Signed)
Patient was identified as falling into the [place] Measure - Diabetes.   Patient was: Appointment scheduled for lab or office visit for A1c. Patient has upcoming appointment scheduled with endocrinology 08/18/2023 for office visit and HgbA1c

## 2023-07-24 ENCOUNTER — Ambulatory Visit: Payer: Medicare Other | Admitting: Family Medicine

## 2023-07-24 ENCOUNTER — Encounter: Payer: Self-pay | Admitting: Family Medicine

## 2023-07-24 VITALS — BP 119/76 | HR 78 | Temp 98.3°F | Ht 74.0 in | Wt 237.0 lb

## 2023-07-24 DIAGNOSIS — J988 Other specified respiratory disorders: Secondary | ICD-10-CM | POA: Diagnosis not present

## 2023-07-24 MED ORDER — AMOXICILLIN-POT CLAVULANATE 875-125 MG PO TABS: 1.0000 | ORAL_TABLET | Freq: Two times a day (BID) | ORAL | 0 refills | Status: DC

## 2023-07-24 MED ORDER — PROMETHAZINE-DM 6.25-15 MG/5ML PO SYRP: 5.0000 mL | ORAL_SOLUTION | Freq: Four times a day (QID) | ORAL | 0 refills | Status: DC | PRN

## 2023-07-24 NOTE — Patient Instructions (Signed)
Rest  Medication as prescribed.  Take care  Dr. Oretta Berkland  

## 2023-07-24 NOTE — Progress Notes (Signed)
Subjective:  Patient ID: Paul Barnes, male    DOB: Jun 21, 1965  Age: 58 y.o. MRN: [id number]  CC:   Chief Complaint  Patient presents with   Cough    Non productive cough, chills, x's 6 days     HPI:  58 year old male presents with respiratory symptoms.  Patient has been sick for 1 week.  Son and wife also sick.  He has been sick longest.  He reports nonproductive cough, chills.  Had sore throat but sore throat has improved.  No documented fever.  His son and wife have had recent fever.  I am concerned that he may have had influenza.  He is most bothered by cough.  He states that is interfering with sleep.  Patient Active Problem List   Diagnosis Date Noted   Respiratory infection 07/24/2023   Alopecia 07/24/2022   Insomnia 10/16/2021   Hyperlipidemia 07/19/2021   Erectile dysfunction 07/19/2021   Osteoarthritis of both knees 07/19/2021   Umbilical hernia 07/19/2021   S/P left knee arthroscopy 01/15/18  01/22/2018   Type 2 diabetes mellitus without complications (HCC) 09/18/2013    Social Hx   Social History   Socioeconomic History   Marital status: Married    Spouse name: Not on file   Number of children: Not on file   Years of education: Not on file   Highest education level: Not on file  Occupational History   Occupation: Curator  Tobacco Use   Smoking status: Never   Smokeless tobacco: Former    Types: Chew    Quit date: 01/08/2017  Vaping Use   Vaping status: Never Used  Substance and Sexual Activity   Alcohol use: Yes    Comment: occassional   Drug use: No   Sexual activity: Yes    Birth control/protection: None  Other Topics Concern   Not on file  Social History Narrative   Not on file   Social Drivers of Health   Financial Resource Strain: Not on file  Food Insecurity: Not on file  Transportation Needs: Not on file  Physical Activity: Not on file  Stress: Not on file  Social Connections: Not on file    Review of Systems Per  HPI  Objective:  BP 119/76   Pulse 78   Temp 98.3 F (36.8 C)   Ht 6\' 2"  (1.88 m)   Wt 237 lb (107.5 kg)   SpO2 95%   BMI 30.43 kg/m      07/24/2023   11:07 AM 05/15/2023    7:59 AM 02/24/2023   10:33 AM  BP/Weight  Systolic BP 119 138 124  Diastolic BP 76 75 83  Wt. (Lbs) 237 235 234.6  BMI 30.43 kg/m2 30.17 kg/m2 30.12 kg/m2    Physical Exam Vitals and nursing note reviewed.  Constitutional:      Appearance: Normal appearance.  HENT:     Head: Normocephalic and atraumatic.     Right Ear: Tympanic membrane normal.     Left Ear: Tympanic membrane normal.     Mouth/Throat:     Pharynx: Posterior oropharyngeal erythema present.  Cardiovascular:     Rate and Rhythm: Normal rate and regular rhythm.  Pulmonary:     Effort: Pulmonary effort is normal.     Breath sounds: Normal breath sounds. No wheezing, rhonchi or rales.  Neurological:     Mental Status: He is alert.     Lab Results  Component Value Date   WBC 6.6 07/24/2022   HGB  16.2 07/24/2022   HCT 48.7 07/24/2022   PLT 322 07/24/2022   GLUCOSE 289 (H) 05/08/2023   CHOL 272 (H) 05/08/2023   TRIG 280 (H) 05/08/2023   HDL 48 05/08/2023   LDLCALC 171 (H) 05/08/2023   ALT 17 05/08/2023   AST 12 05/08/2023   NA 137 05/08/2023   K 4.4 05/08/2023   CL 98 05/08/2023   CREATININE 1.04 05/08/2023   BUN 15 05/08/2023   CO2 22 05/08/2023   TSH 2.240 05/08/2023   HGBA1C 9.9 (A) 05/15/2023   MICROALBUR 30 09/10/2021     Assessment & Plan:  Respiratory infection Assessment & Plan: Discern for bacterial infection following recent suspected influenza.  Treating with Augmentin and Promethazine DM.   Other orders -     Amoxicillin-Pot Clavulanate; Take 1 tablet by mouth 2 (two) times daily.  Dispense: 14 tablet; Refill: 0 -     Promethazine-DM; Take 5 mLs by mouth 4 (four) times daily as needed.  Dispense: 118 mL; Refill: 0   Follow-up:  Return if symptoms worsen or fail to improve.  Everlene Other  DO [facility]

## 2023-07-24 NOTE — Assessment & Plan Note (Signed)
Discern for bacterial infection following recent suspected influenza.  Treating with Augmentin and Promethazine DM.

## 2023-07-25 ENCOUNTER — Telehealth: Payer: Self-pay

## 2023-07-25 NOTE — Telephone Encounter (Signed)
Communication  Reason for CRM: Patient was in the office yesterday and the medication they got for the coughing is not working. They are wanting something else if possible that is a bit stronger. Also wanting know if he can get a prescription for magic mouthwash.

## 2023-07-29 ENCOUNTER — Ambulatory Visit: Payer: Self-pay | Admitting: Family Medicine

## 2023-07-29 NOTE — Telephone Encounter (Signed)
  Unable to reach patient after multiple attempts will route to practice.  Copied from CRM [id number]. Topic: Clinical - Medical Advice >> Jul 29, 2023 11:49 AM Patsy Lager T wrote: Reason for CRM: patient called stated he is still coughing and unable to sleep at night. Requesting another medication to calm the cough. Please f/u with patient    Reason for Disposition . Third attempt to contact caller AND no contact made. Phone number verified.  Protocols used: No Contact or Duplicate Contact Call-A-AH

## 2023-07-29 NOTE — Telephone Encounter (Signed)
 Copied from CRM [id number]. Topic: Clinical - Medical Advice >> Jul 29, 2023 11:49 AM Patsy Lager T wrote: Reason for CRM: patient called stated he is still coughing and unable to sleep at night. Requesting another medication to calm the cough. Please f/u with patient  Attempted to call patient and his wife's phone.  Call cannot be completed--unable to leave a message. 1st attempt

## 2023-08-18 ENCOUNTER — Ambulatory Visit: Payer: Self-pay | Admitting: Family Medicine

## 2023-08-18 ENCOUNTER — Ambulatory Visit: Payer: BC Managed Care – PPO | Admitting: Nurse Practitioner

## 2023-08-18 ENCOUNTER — Telehealth: Payer: Self-pay | Admitting: Nurse Practitioner

## 2023-08-18 ENCOUNTER — Other Ambulatory Visit: Payer: Self-pay | Admitting: Nurse Practitioner

## 2023-08-18 DIAGNOSIS — Z7985 Long-term (current) use of injectable non-insulin antidiabetic drugs: Secondary | ICD-10-CM

## 2023-08-18 DIAGNOSIS — E782 Mixed hyperlipidemia: Secondary | ICD-10-CM

## 2023-08-18 DIAGNOSIS — E1165 Type 2 diabetes mellitus with hyperglycemia: Secondary | ICD-10-CM

## 2023-08-18 DIAGNOSIS — I10 Essential (primary) hypertension: Secondary | ICD-10-CM

## 2023-08-18 MED ORDER — GLIPIZIDE ER 5 MG PO TB24: 5.0000 mg | ORAL_TABLET | Freq: Every day | ORAL | 1 refills | Status: DC

## 2023-08-18 NOTE — Telephone Encounter (Signed)
 2nd attempt, "call cannot be completed as dialed error message".  Copied from CRM [id number]. Topic: Clinical - Medication Question >> Aug 18, 2023  8:46 AM Shon Hale wrote: Reason for CRM: Patient believes he may have a possible sinus infection. Patient requesting an antibiotic. Patient states his cough is getting better and he is taking medicine for that, however he is having drainage. Patient states he is also tired.    Please assist patient further.

## 2023-08-18 NOTE — Telephone Encounter (Signed)
 3rd attempt, tried calling patient and wife's phone numbers listed and received:  "call cannot be completed as dialed error message". Routing message to clinic.   Copied from CRM [id number]. Topic: Clinical - Medication Question >> Aug 18, 2023  8:46 AM Shon Hale wrote: Reason for CRM: Patient believes he may have a possible sinus infection. Patient requesting an antibiotic. Patient states his cough is getting better and he is taking medicine for that, however he is having drainage. Patient states he is also tired.

## 2023-08-18 NOTE — Telephone Encounter (Signed)
 I will send in Glipizide for him.

## 2023-08-18 NOTE — Telephone Encounter (Signed)
 Pt states his numbers are running between 250-300 for awhile. He had found some glipizide last week at home and it helped so he is asking for you to call that in. The Januvia is not working. The glipizide is knocking it down 100 points. He uses [facility] on [address]

## 2023-08-18 NOTE — Telephone Encounter (Signed)
 1st attempt, "call cannot be completed as dialed error message".  Copied from CRM [id number]. Topic: Clinical - Medication Question >> Aug 18, 2023  8:46 AM Paul Barnes wrote: Reason for CRM: Patient believes he may have a possible sinus infection. Patient requesting an antibiotic. Patient states his cough is getting better and he is taking medicine for that, however he is having drainage. Patient states he is also tired.    Please assist patient further.

## 2023-08-19 ENCOUNTER — Encounter: Payer: Self-pay | Admitting: Physician Assistant

## 2023-08-19 ENCOUNTER — Ambulatory Visit: Admitting: Physician Assistant

## 2023-08-19 VITALS — BP 111/77 | HR 79 | Temp 98.3°F | Ht 74.0 in | Wt 236.0 lb

## 2023-08-19 DIAGNOSIS — J069 Acute upper respiratory infection, unspecified: Secondary | ICD-10-CM

## 2023-08-19 DIAGNOSIS — B9689 Other specified bacterial agents as the cause of diseases classified elsewhere: Secondary | ICD-10-CM

## 2023-08-19 MED ORDER — DOXYCYCLINE HYCLATE 100 MG PO TABS: 100.0000 mg | ORAL_TABLET | Freq: Two times a day (BID) | ORAL | 0 refills | Status: DC

## 2023-08-19 NOTE — Progress Notes (Signed)
   Acute Office Visit  Subjective:     Patient ID: Paul Barnes, male    DOB: 10/16/1965, 58 y.o.   MRN: [id number]   Patient presents today with complaints of cough and congestion.  He reports flulike illness 3 to 4 weeks ago, cough lingering since then.  He reports previously prescribed cough syrup is effective for symptom relief.  He reports congestion and fatigue began on Friday.  He reports over the weekend increased fatigue and low energy levels.  He reports eating and drinking as usual.  He denies fever or headaches.  He reports increased mucus production.  He denies sick contacts at home.     Review of Systems  Constitutional:  Positive for malaise/fatigue. Negative for chills, diaphoresis and fever.  HENT:  Positive for congestion. Negative for ear pain, sinus pain and sore throat.   Respiratory:  Positive for cough and sputum production. Negative for shortness of breath and wheezing.   Cardiovascular:  Negative for chest pain and palpitations.  Musculoskeletal:  Negative for myalgias.  Neurological:  Negative for headaches.        Objective:     BP 111/77   Pulse 79   Temp 98.3 F (36.8 C)   Ht 6\' 2"  (1.88 m)   Wt 236 lb (107 kg)   SpO2 98%   BMI 30.30 kg/m   Physical Exam  No results found for any visits on 08/19/23.      Assessment & Plan:  Bacterial URI -     Doxycycline Hyclate; Take 1 tablet (100 mg total) by mouth 2 (two) times daily for 7 days.  Dispense: 14 tablet; Refill: 0   Patient appears stable today. Benign exam, lungs clear to auscultation bilaterally, no indication for chest XR at this time. Discussed that this fits the picture of viral vs bacterial URI and that due to type and duration of symptoms and exam findings, we will treat as bacterial URI. No evidence of other bacterial infections including pneumonia, pharyngitis, sinusitis, or otitis media. Supportive care reviewed with patient. Tylenol or ibuprofen for pain as needed. May  continue with OTC cold medications. Patient instructed to return to clinic if worsening shortness of breath, chest pain, hypoxia, or other concerns. Patient agreeable to plan.    Return if symptoms worsen or fail to improve.  Toni Amend Lenny Fiumara, PA-C

## 2023-08-26 ENCOUNTER — Ambulatory Visit: Payer: BC Managed Care – PPO | Admitting: Family Medicine

## 2023-08-26 ENCOUNTER — Encounter: Payer: Self-pay | Admitting: Family Medicine

## 2023-08-26 VITALS — BP 121/80 | HR 74 | Temp 98.1°F | Ht 74.0 in | Wt 232.6 lb

## 2023-08-26 DIAGNOSIS — Z7984 Long term (current) use of oral hypoglycemic drugs: Secondary | ICD-10-CM

## 2023-08-26 DIAGNOSIS — E1169 Type 2 diabetes mellitus with other specified complication: Secondary | ICD-10-CM | POA: Diagnosis not present

## 2023-08-26 DIAGNOSIS — E782 Mixed hyperlipidemia: Secondary | ICD-10-CM | POA: Diagnosis not present

## 2023-08-26 DIAGNOSIS — E119 Type 2 diabetes mellitus without complications: Secondary | ICD-10-CM

## 2023-08-26 MED ORDER — FLUTICASONE PROPIONATE 50 MCG/ACT NA SUSP: 2.0000 | Freq: Every day | NASAL | 0 refills | Status: DC

## 2023-08-26 MED ORDER — ALBUTEROL SULFATE HFA 108 (90 BASE) MCG/ACT IN AERS: 2.0000 | INHALATION_SPRAY | Freq: Four times a day (QID) | RESPIRATORY_TRACT | 2 refills | Status: AC | PRN

## 2023-08-26 MED ORDER — SITAGLIPTIN PHOSPHATE 100 MG PO TABS: 100.0000 mg | ORAL_TABLET | Freq: Every day | ORAL | 3 refills | Status: DC

## 2023-08-26 NOTE — Progress Notes (Signed)
 Subjective:  Patient ID: Paul Barnes, male    DOB: 1966-03-15  Age: 58 y.o. MRN: [id number]  CC: Follow-up   HPI:  58 year old male with uncontrolled type 2 diabetes, osteoarthritis, alopecia, hyperlipidemia presents for follow-up.  Patient still having some respiratory symptoms.  Needs refill on albuterol and Flonase.  Patient's blood pressure is well-controlled.  Patient's diabetes continues to be uncontrolled.  Patient states that he does not feel like Januvia is helping much.  He states that he was recently prescribed glipizide.  He is not taking this on a regular basis.  Advised patient that he should take this on a regular basis as his A1c is uncontrolled.  Patient Active Problem List   Diagnosis Date Noted   Alopecia 07/24/2022   Insomnia 10/16/2021   Hyperlipidemia 07/19/2021   Erectile dysfunction 07/19/2021   Osteoarthritis of both knees 07/19/2021   Umbilical hernia 07/19/2021   S/P left knee arthroscopy 01/15/18  01/22/2018   Type 2 diabetes mellitus without complications (HCC) 09/18/2013    Social Hx   Social History   Socioeconomic History   Marital status: Married    Spouse name: Not on file   Number of children: Not on file   Years of education: Not on file   Highest education level: Not on file  Occupational History   Occupation: Curator  Tobacco Use   Smoking status: Never   Smokeless tobacco: Former    Types: Chew    Quit date: 01/08/2017  Vaping Use   Vaping status: Never Used  Substance and Sexual Activity   Alcohol use: Yes    Comment: occassional   Drug use: No   Sexual activity: Yes    Birth control/protection: None  Other Topics Concern   Not on file  Social History Narrative   Not on file   Social Drivers of Health   Financial Resource Strain: Not on file  Food Insecurity: Not on file  Transportation Needs: Not on file  Physical Activity: Not on file  Stress: Not on file  Social Connections: Not on file    Review of  Systems Per HPI  Objective:  BP 121/80   Pulse 74   Temp 98.1 F (36.7 C)   Ht 6\' 2"  (1.88 m)   Wt 232 lb 9.6 oz (105.5 kg)   SpO2 98%   BMI 29.86 kg/m      08/26/2023   10:05 AM 08/19/2023   10:06 AM 07/24/2023   11:07 AM  BP/Weight  Systolic BP 121 111 119  Diastolic BP 80 77 76  Wt. (Lbs) 232.6 236 237  BMI 29.86 kg/m2 30.3 kg/m2 30.43 kg/m2    Physical Exam Vitals and nursing note reviewed.  Constitutional:      General: He is not in acute distress.    Appearance: Normal appearance.  HENT:     Head: Normocephalic and atraumatic.  Eyes:     General:        Right eye: No discharge.        Left eye: No discharge.     Conjunctiva/sclera: Conjunctivae normal.  Cardiovascular:     Rate and Rhythm: Normal rate and regular rhythm.  Pulmonary:     Effort: Pulmonary effort is normal.     Breath sounds: Normal breath sounds. No wheezing, rhonchi or rales.  Neurological:     Mental Status: He is alert.  Psychiatric:        Mood and Affect: Mood normal.  Behavior: Behavior normal.     Lab Results  Component Value Date   WBC 6.6 07/24/2022   HGB 16.2 07/24/2022   HCT 48.7 07/24/2022   PLT 322 07/24/2022   GLUCOSE 289 (H) 05/08/2023   CHOL 272 (H) 05/08/2023   TRIG 280 (H) 05/08/2023   HDL 48 05/08/2023   LDLCALC 171 (H) 05/08/2023   ALT 17 05/08/2023   AST 12 05/08/2023   NA 137 05/08/2023   K 4.4 05/08/2023   CL 98 05/08/2023   CREATININE 1.04 05/08/2023   BUN 15 05/08/2023   CO2 22 05/08/2023   TSH 2.240 05/08/2023   HGBA1C 9.9 (A) 05/15/2023   MICROALBUR 30 09/10/2021     Assessment & Plan:  Type 2 diabetes mellitus without complication, without long-term current use of insulin (HCC) Assessment & Plan: Uncontrolled and worsening. increasing Januvia.  Advised compliance with glipizide.   Mixed hyperlipidemia Assessment & Plan: Uncontrolled.  Needs statin therapy.   Other orders -     Albuterol Sulfate HFA; Inhale 2 puffs into the  lungs every 6 (six) hours as needed for wheezing or shortness of breath.  Dispense: 18 g; Refill: 2 -     Fluticasone Propionate; Place 2 sprays into both nostrils daily.  Dispense: 16 g; Refill: 0 -     SITagliptin Phosphate; Take 1 tablet (100 mg total) by mouth daily.  Dispense: 90 tablet; Refill: 3    Follow-up:  6 months  Bell Carbo Adriana Simas DO [facility]

## 2023-08-26 NOTE — Assessment & Plan Note (Addendum)
 Uncontrolled and worsening. increasing Januvia.  Advised compliance with glipizide.

## 2023-08-26 NOTE — Patient Instructions (Signed)
 Medications sent in.  Follow up in 6 months.  Take care  Dr. Adriana Simas

## 2023-08-26 NOTE — Assessment & Plan Note (Signed)
Uncontrolled. Needs statin therapy. 

## 2023-08-27 NOTE — Progress Notes (Signed)
   Acute Office Visit  Subjective:     Patient ID: Paul Barnes, male    DOB: [birth year], 58 y.o.   MRN: [id number]   Patient presents today with complaints of cough and congestion.  He reports flulike illness 3 to 4 weeks ago, cough lingering since then.  He reports previously prescribed cough syrup is effective for symptom relief.  He reports congestion and fatigue began on Friday.  He reports over the weekend increased fatigue and low energy levels.  He reports eating and drinking as usual.  He denies fever or headaches.  He reports increased mucus production.  He denies sick contacts at home.  Cough Pertinent negatives include no chest pain, chills, ear pain, fever, headaches, myalgias, sore throat, shortness of breath or wheezing.     Review of Systems  Constitutional:  Positive for malaise/fatigue. Negative for chills, diaphoresis and fever.  HENT:  Positive for congestion. Negative for ear pain, sinus pain and sore throat.   Respiratory:  Positive for cough and sputum production. Negative for shortness of breath and wheezing.   Cardiovascular:  Negative for chest pain and palpitations.  Musculoskeletal:  Negative for myalgias.  Neurological:  Negative for headaches.        Objective:     BP 111/77   Pulse 79   Temp 98.3 F (36.8 C)   Ht 6\' 2"  (1.88 m)   Wt 236 lb (107 kg)   SpO2 98%   BMI 30.30 kg/m   Physical Exam Constitutional:      Appearance: Normal appearance. He is normal weight.  HENT:     Head: Normocephalic.     Right Ear: Tympanic membrane normal.     Left Ear: Tympanic membrane normal.     Nose: Congestion and rhinorrhea present.     Mouth/Throat:     Mouth: Mucous membranes are moist.     Pharynx: Oropharynx is clear. Posterior oropharyngeal erythema present.  Eyes:     Extraocular Movements: Extraocular movements intact.     Conjunctiva/sclera: Conjunctivae normal.  Cardiovascular:     Rate and Rhythm: Normal rate and regular rhythm.      Heart sounds: Normal heart sounds. No murmur heard. Pulmonary:     Effort: Pulmonary effort is normal.     Breath sounds: Normal breath sounds. No wheezing or rales.  Musculoskeletal:     Cervical back: Normal range of motion.  Neurological:     Mental Status: He is alert.     No results found for any visits on 08/19/23.      Assessment & Plan:  Bacterial URI   Patient appears stable today. Benign exam, lungs clear to auscultation bilaterally, no indication for chest XR at this time. Discussed that this fits the picture of viral vs bacterial URI and that due to type and duration of symptoms and exam findings, we will treat as bacterial URI. No evidence of other bacterial infections including pneumonia, pharyngitis, sinusitis, or otitis media. Supportive care reviewed with patient. Tylenol or ibuprofen for pain as needed. May continue with OTC cold medications. Patient instructed to return to clinic if worsening shortness of breath, chest pain, hypoxia, or other concerns. Patient agreeable to plan.    Return if symptoms worsen or fail to improve.  Toni Amend Lianah Peed, PA-C

## 2023-09-04 ENCOUNTER — Encounter: Payer: Self-pay | Admitting: Gastroenterology

## 2023-09-26 ENCOUNTER — Other Ambulatory Visit (HOSPITAL_COMMUNITY): Payer: Self-pay

## 2023-09-26 ENCOUNTER — Telehealth: Payer: Self-pay

## 2023-09-26 NOTE — Telephone Encounter (Signed)
 Pharmacy Patient Advocate Encounter   Received notification from CoverMyMeds that prior authorization for Ozempic  is required/requested.   Insurance verification completed.   The patient is insured through [facility] .   Per test claim: PA required; PA submitted to above mentioned insurance via CoverMyMeds Key/confirmation #/EOC [id number] Status is pending

## 2023-10-03 ENCOUNTER — Ambulatory Visit: Admitting: Nurse Practitioner

## 2023-10-03 ENCOUNTER — Encounter: Payer: Self-pay | Admitting: Nurse Practitioner

## 2023-10-03 VITALS — BP 122/82 | HR 62 | Ht 74.0 in | Wt 240.4 lb

## 2023-10-03 DIAGNOSIS — E782 Mixed hyperlipidemia: Secondary | ICD-10-CM | POA: Diagnosis not present

## 2023-10-03 DIAGNOSIS — R5382 Chronic fatigue, unspecified: Secondary | ICD-10-CM

## 2023-10-03 DIAGNOSIS — E1165 Type 2 diabetes mellitus with hyperglycemia: Secondary | ICD-10-CM | POA: Diagnosis not present

## 2023-10-03 DIAGNOSIS — Z7985 Long-term (current) use of injectable non-insulin antidiabetic drugs: Secondary | ICD-10-CM | POA: Diagnosis not present

## 2023-10-03 DIAGNOSIS — I10 Essential (primary) hypertension: Secondary | ICD-10-CM

## 2023-10-03 LAB — POCT GLYCOSYLATED HEMOGLOBIN (HGB A1C): Hemoglobin A1C: 10.5 % — AB (ref 4.0–5.6)

## 2023-10-03 MED ORDER — GLIPIZIDE ER 5 MG PO TB24: 10.0000 mg | ORAL_TABLET | Freq: Every day | ORAL | 3 refills | Status: AC

## 2023-10-03 MED ORDER — ACCU-CHEK GUIDE TEST VI STRP: ORAL_STRIP | 12 refills | Status: DC

## 2023-10-03 MED ORDER — SITAGLIPTIN PHOSPHATE 100 MG PO TABS: 100.0000 mg | ORAL_TABLET | Freq: Every day | ORAL | 3 refills | Status: DC

## 2023-10-03 NOTE — Progress Notes (Signed)
 Endocrinology Follow Up Note       10/03/2023, 8:41 AM   Subjective:    Patient ID: Paul Barnes, male    DOB: 03/27/1966.  Paul Barnes is being seen in follow up after being seen in consultation for management of currently uncontrolled symptomatic diabetes requested by  Cook, Jayce G, DO.   Past Medical History:  Diagnosis Date   Allergic rhinitis    Allergy    Arthritis    Diabetes mellitus without complication (HCC)    History of kidney stones    Hyperlipidemia    not on meds    Past Surgical History:  Procedure Laterality Date   KNEE ARTHROSCOPY WITH MEDIAL MENISECTOMY Right 02/11/2014   Procedure: KNEE ARTHROSCOPY WITH MEDIAL MENISECTOMY AND CHONDROPLASTY;  Surgeon: Darrin Emerald, MD;  Location: [facility];  Service: Orthopedics;  Laterality: Right;   KNEE ARTHROSCOPY WITH MEDIAL MENISECTOMY Left 01/15/2018   Procedure: Chondroplasty medial  condyle  and trochlea left knee;  Surgeon: Darrin Emerald, MD;  Location: [facility];  Service: Orthopedics;  Laterality: Left;   PLANTAR FASCIA RELEASE Right 11/09/2014   Procedure: ENDOSCOPIC PLANTAR FASCIOTOMY;  Surgeon: Iverson Market, DPM;  Location: [facility];  Service: Podiatry;  Laterality: Right;   SHOULDER ARTHROSCOPY Right     Social History   Socioeconomic History   Marital status: Married    Spouse name: Not on file   Number of children: Not on file   Years of education: Not on file   Highest education level: Not on file  Occupational History   Occupation: Curator  Tobacco Use   Smoking status: Never   Smokeless tobacco: Former    Types: Chew    Quit date: 01/08/2017  Vaping Use   Vaping status: Never Used  Substance and Sexual Activity   Alcohol use: Yes    Comment: occassional   Drug use: No   Sexual activity: Yes    Birth control/protection: None  Other Topics Concern   Not on file  Social History Narrative   Not on file    Social Drivers of Health   Financial Resource Strain: Not on file  Food Insecurity: Not on file  Transportation Needs: Not on file  Physical Activity: Not on file  Stress: Not on file  Social Connections: Not on file    Family History  Problem Relation Age of Onset   Heart disease Unknown    Cancer Unknown    Diabetes Unknown    Cancer Mother    Heart disease Father    Colon cancer Neg Hx    Colon polyps Neg Hx    Esophageal cancer Neg Hx    Stomach cancer Neg Hx    Rectal cancer Neg Hx     Outpatient Encounter Medications as of 10/03/2023  Medication Sig   Accu-Chek Softclix Lancets lancets Use to check sugars every day   albuterol  (VENTOLIN  HFA) 108 (90 Base) MCG/ACT inhaler Inhale 2 puffs into the lungs every 6 (six) hours as needed for wheezing or shortness of breath.   Cholecalciferol (VITAMIN D3) 25 MCG (1000 UT) CAPS Take 1,000 Units by mouth daily.   fluticasone  (FLONASE ) 50 MCG/ACT nasal  spray Place 2 sprays into both nostrils daily.   glucose blood (ACCU-CHEK GUIDE TEST) test strip Use as instructed to monitor glucose twice daily   Multiple Vitamin (MULTIVITAMIN WITH MINERALS) TABS tablet Take 1 tablet by mouth daily.   promethazine -dextromethorphan (PROMETHAZINE -DM) 6.25-15 MG/5ML syrup Take 5 mLs by mouth 4 (four) times daily as needed.   [DISCONTINUED] glipiZIDE  (GLUCOTROL  XL) 5 MG 24 hr tablet Take 1 tablet (5 mg total) by mouth daily with breakfast.   [DISCONTINUED] glucose blood (ACCU-CHEK GUIDE) test strip USE TO TEST BLOOD SUGAR twice daily   [DISCONTINUED] sitaGLIPtin  (JANUVIA ) 100 MG tablet Take 1 tablet (100 mg total) by mouth daily.   glipiZIDE  (GLUCOTROL  XL) 5 MG 24 hr tablet Take 2 tablets (10 mg total) by mouth daily with breakfast.   sitaGLIPtin  (JANUVIA ) 100 MG tablet Take 1 tablet (100 mg total) by mouth daily.   No facility-administered encounter medications on file as of 10/03/2023.    ALLERGIES: Allergies  Allergen Reactions   Ozempic  (0.25  Or 0.5 Mg-Dose) [Semaglutide (0.25 Or 0.5mg -Dos)]     Patient started having GI issues, Gallbladder attack    VACCINATION STATUS:  There is no immunization history on file for this patient.  Diabetes He presents for his follow-up diabetic visit. He has type 2 diabetes mellitus. Onset time: Diagnosed at approx age of 41. His disease course has been worsening. There are no hypoglycemic associated symptoms. Associated symptoms include fatigue and foot paresthesias. Pertinent negatives for diabetes include no blurred vision, no polydipsia and no polyuria. There are no hypoglycemic complications. Symptoms are stable. Diabetic complications include nephropathy. Risk factors for coronary artery disease include diabetes mellitus, dyslipidemia, hypertension and male sex. Current diabetic treatment includes oral agent (dual therapy). His weight is increasing steadily. He is following a generally healthy diet. When asked about meal planning, he reported none. He has not had a previous visit with a dietitian. He participates in exercise intermittently. His home blood glucose trend is increasing steadily. His breakfast blood glucose range is generally 180-200 mg/dl. (He presents today with his logs showing above target fasting glycemic profile.  His POCT A1c today is 10.5%, increasing from last visit of 9.9%.  He notes he was sick back in March and then he saw a specialist who started Prednisone  for his hair loss which has run his glucose up as well.  His PCP did increase his Januvia  to 100 mg to help combat this.  He has continued to work hard on diet and has even increased his activity some.) An ACE inhibitor/angiotensin II receptor blocker is not being taken. He does not see a podiatrist.Eye exam is current.  Hyperlipidemia This is a chronic problem. The current episode started more than 1 year ago. The problem is uncontrolled. Recent lipid tests were reviewed and are high. Exacerbating diseases include chronic  renal disease and diabetes. Factors aggravating his hyperlipidemia include fatty foods. Current antihyperlipidemic treatment includes statins. Compliance problems include adherence to diet, adherence to exercise and medication side effects (has myalgias with statin).  Risk factors for coronary artery disease include diabetes mellitus, dyslipidemia, family history, male sex and hypertension.  Hypertension This is a chronic problem. The current episode started more than 1 year ago. The problem has been resolved since onset. The problem is controlled. Pertinent negatives include no blurred vision. There are no associated agents to hypertension. Risk factors for coronary artery disease include diabetes mellitus, dyslipidemia, family history and male gender. Past treatments include nothing. Compliance problems include diet and exercise.  Hypertensive end-organ damage includes kidney disease. Identifiable causes of hypertension include chronic renal disease.     Review of systems  Constitutional: + increasing body weight,  current Body mass index is 30.87 kg/m. , no fatigue, no subjective hyperthermia, no subjective hypothermia Eyes: no blurry vision, no xerophthalmia ENT: no sore throat, no nodules palpated in throat, no dysphagia/odynophagia, no hoarseness Cardiovascular: no chest pain, no shortness of breath, no palpitations, no leg swelling Respiratory: no cough, no shortness of breath Gastrointestinal: no nausea/vomiting/diarrhea Musculoskeletal: no muscle/joint aches Skin: no rashes, no hyperemia, + hair loss Neurological: no tremors, no numbness, no tingling, no dizziness Psychiatric: no depression, no anxiety, + increased stress  Objective:     BP 122/82 (BP Location: Right Arm, Patient Position: Sitting, Cuff Size: Large)   Pulse 62   Ht 6\' 2"  (1.88 m)   Wt 240 lb 6.4 oz (109 kg)   BMI 30.87 kg/m   Wt Readings from Last 3 Encounters:  10/03/23 240 lb 6.4 oz (109 kg)  08/26/23 232 lb  9.6 oz (105.5 kg)  08/19/23 236 lb (107 kg)     BP Readings from Last 3 Encounters:  10/03/23 122/82  08/26/23 121/80  08/19/23 111/77      Physical Exam- Limited  Constitutional:  Body mass index is 30.87 kg/m. , not in acute distress, normal state of mind Eyes:  EOMI, no exophthalmos Musculoskeletal: no gross deformities, strength intact in all four extremities, no gross restriction of joint movements Skin:  no rashes, no hyperemia, + patchy hair loss to head and face (missing his right eyebrow)  Neurological: no tremor with outstretched hands  Diabetic Foot Exam - Simple   No data filed     CMP ( most recent) CMP     Component Value Date/Time   NA 137 05/08/2023 1234   K 4.4 05/08/2023 1234   CL 98 05/08/2023 1234   CO2 22 05/08/2023 1234   GLUCOSE 289 (H) 05/08/2023 1234   GLUCOSE 154 (H) 01/08/2018 1357   BUN 15 05/08/2023 1234   CREATININE 1.04 05/08/2023 1234   CALCIUM  9.7 05/08/2023 1234   PROT 6.9 05/08/2023 1234   ALBUMIN 4.6 05/08/2023 1234   AST 12 05/08/2023 1234   ALT 17 05/08/2023 1234   ALKPHOS 98 05/08/2023 1234   BILITOT 1.0 05/08/2023 1234   GFRNONAA 79 09/27/2019 0858   GFRAA 91 09/27/2019 0858     Diabetic Labs (most recent): Lab Results  Component Value Date   HGBA1C 10.5 (A) 10/03/2023   HGBA1C 9.9 (A) 05/15/2023   HGBA1C 9.3 (A) 12/26/2022   MICROALBUR 30 09/10/2021     Lipid Panel ( most recent) Lipid Panel     Component Value Date/Time   CHOL 272 (H) 05/08/2023 1234   TRIG 280 (H) 05/08/2023 1234   HDL 48 05/08/2023 1234   CHOLHDL 5.7 (H) 05/08/2023 1234   LDLCALC 171 (H) 05/08/2023 1234   LABVLDL 53 (H) 05/08/2023 1234      Lab Results  Component Value Date   TSH 2.240 05/08/2023   FREET4 1.19 05/08/2023           Assessment & Plan:   1) Type 2 diabetes mellitus with hyperglycemia, without long-term current use of insulin (HCC)  He presents today with his logs showing above target fasting glycemic profile.   His POCT A1c today is 10.5%, increasing from last visit of 9.9%.  He notes he was sick back in March and then he saw a specialist who  started Prednisone  for his hair loss which has run his glucose up as well.  His PCP did increase his Januvia  to 100 mg to help combat this.  He has continued to work hard on diet and has even increased his activity some.  Paul Barnes has currently uncontrolled symptomatic type 2 DM since 58 years of age.   -Recent labs reviewed.  - I had a long discussion with him about the progressive nature of diabetes and the pathology behind its complications. -his diabetes is complicated by mild CKD and neuropathy and he remains at a high risk for more acute and chronic complications which include CAD, CVA, CKD, retinopathy, and neuropathy. These are all discussed in detail with him.  - Nutritional counseling repeated at each appointment due to patients tendency to fall back in to old habits.  - The patient admits there is a room for improvement in their diet and drink choices. -  Suggestion is made for the patient to avoid simple carbohydrates from their diet including Cakes, Sweet Desserts / Pastries, Ice Cream, Soda (diet and regular), Sweet Tea, Candies, Chips, Cookies, Sweet Pastries, Store Bought Juices, Alcohol in Excess of 1-2 drinks a day, Artificial Sweeteners, Coffee Creamer, and "Sugar-free" Products. This will help patient to have stable blood glucose profile and potentially avoid unintended weight gain.   - I encouraged the patient to switch to unprocessed or minimally processed complex starch and increased protein intake (animal or plant source), fruits, and vegetables.   - Patient is advised to stick to a routine mealtimes to eat 3 meals a day and avoid unnecessary snacks (to snack only to correct hypoglycemia).  - he will be scheduled with Penny Crumpton, RDN, CDE for diabetes education.  - I have approached him with the following individualized plan  to manage his diabetes and patient agrees:   -He is advised to continue Januvia  100 mg po daily and increase Glipizide  to 10 mg XL daily at breakfast (especially on days he is taking his prednisone ).  We may need to revamp our plan if steroids are needed for a longer term.  -he is advised to monitor glucose once daily, before breakfast and any other time if experiencing symptoms and to call the clinic if he has readings less than 70 or above 300 for 3 tests in a row.  - Adjustment parameters are given to him for hypo and hyperglycemia in writing.  - Specific targets for  A1c; LDL, HDL, and Triglycerides were discussed with the patient.  2) Blood Pressure /Hypertension:  his blood pressure is controlled to target without the use of antihypertensive medications.  He will be considered for low dose ACE/ARB on subsequent visits if BP is above 140/90 on 3 separate occasions.  3) Lipids/Hyperlipidemia:    Review of his recent lipid panel from 05/08/23 showed uncontrolled LDL at 171 (worsening) but greatly improved triglycerides of 280. He does report significant myalgias with his statin, therefore he hopes to be taken off it in the future with lifestyle changes.  4)  Weight/Diet:  his Body mass index is 30.87 kg/m.  -  clearly complicating his diabetes care.   he is a candidate for weight loss. I discussed with him the fact that loss of 5 - 10% of his  current body weight will have the most impact on his diabetes management.  Exercise, and detailed carbohydrates information provided  -  detailed on discharge instructions.  5) Chronic Care/Health Maintenance: -he is not on ACEI/ARB  or Statin medications and is encouraged to initiate and continue to follow up with Ophthalmology, Dentist, Podiatrist at least yearly or according to recommendations, and advised to stay away from smoking. I have recommended yearly flu vaccine and pneumonia vaccine at least every 5 years; moderate intensity exercise for up to  150 minutes weekly; and sleep for at least 7 hours a day.  - he is advised to maintain close follow up with Cook, Jayce G, DO for primary care needs, as well as his other providers for optimal and coordinated care.      I spent  37  minutes in the care of the patient today including review of labs from CMP, Lipids, Thyroid  Function, Hematology (current and previous including abstractions from other facilities); face-to-face time discussing  his blood glucose readings/logs, discussing hypoglycemia and hyperglycemia episodes and symptoms, medications doses, his options of short and long term treatment based on the latest standards of care / guidelines;  discussion about incorporating lifestyle medicine;  and documenting the encounter. Risk reduction counseling performed per USPSTF guidelines to reduce obesity and cardiovascular risk factors.     Please refer to Patient Instructions for Blood Glucose Monitoring and Insulin/Medications Dosing Guide"  in media tab for additional information. Please  also refer to " Patient Self Inventory" in the Media  tab for reviewed elements of pertinent patient history.  Paul Barnes participated in the discussions, expressed understanding, and voiced agreement with the above plans.  All questions were answered to his satisfaction. he is encouraged to contact clinic should he have any questions or concerns prior to his return visit.     Follow up plan: - Return in about 3 months (around 01/03/2024) for Diabetes F/U with A1c in office, No previsit labs, Bring meter and logs.   Hulon Magic, [facility] [address] [place], [place] [address] Phone: [phone] Fax: [phone]  10/03/2023, 8:41 AM

## 2023-10-07 NOTE — Telephone Encounter (Signed)
 Pharmacy Patient Advocate Encounter  Received notification from [employer] that Prior Authorization for Ozempic  has been APPROVED through 06/02/2024   PA #/Case ID/Reference #: [id number]

## 2023-10-09 NOTE — Telephone Encounter (Signed)
 Patient was called and made aware. He questioned of we had the right person as he has not been on this medication in a long time. He said that he went to get the Januvia  ant was going to be several hundred dollars. Patient advised that I would share this with Whitney.

## 2023-10-09 NOTE — Telephone Encounter (Signed)
 Yea, I am not sure why it was initiated as he has not been on this for quite some time.  That's odd.   As far as the Januvia  goes, we may need to print a patient assistance program to give him.  Medicare does not allow patients to use copay cards.  OR we can roll with another plan that we talked about at the last visit..... basal insulin which has a cost cap on it.

## 2023-10-10 NOTE — Telephone Encounter (Signed)
 It is possible that it is a deductible that needs to be met first.  He can always call his insurance to find out.  They may have another option to split payments too?

## 2023-10-10 NOTE — Telephone Encounter (Signed)
 Talked with Paul Barnes. He shares that Dr. Debrah Fan has given him Januvia  100 mg and he has enough to last another month or longer.  He is wondering if his insurance is waiting on him to meet a deductible before paying for the medication. Patient ask that we wait to see, and if that is not the issue, he would like to come back in and discuss another route.

## 2023-10-23 NOTE — Telephone Encounter (Signed)
 Patient has ask to wait.

## 2023-10-30 ENCOUNTER — Encounter: Payer: Self-pay | Admitting: Gastroenterology

## 2023-10-30 ENCOUNTER — Ambulatory Visit (AMBULATORY_SURGERY_CENTER)

## 2023-10-30 VITALS — Ht 74.0 in | Wt 230.0 lb

## 2023-10-30 DIAGNOSIS — Z860100 Personal history of colon polyps, unspecified: Secondary | ICD-10-CM

## 2023-10-30 MED ORDER — NA SULFATE-K SULFATE-MG SULF 17.5-3.13-1.6 GM/177ML PO SOLN: 1.0000 | Freq: Once | ORAL | 0 refills | Status: AC

## 2023-10-30 NOTE — Progress Notes (Signed)
 Pt's name and DOB verified at the beginning of the pre-visit wit 2 identifiers  Pt denies any difficulty with ambulating,sitting, laying down or rolling side to side  Pt has no issues moving head neck or swallowing  No egg or soy allergy known to patient   No issues known to pt with past sedation with any surgeries or procedures  No FH of Malignant Hyperthermia  Pt is not on home 02   Pt is not on blood thinners   Pt denies issues with constipation   Pt is not on dialysis  Pt denise any abnormal heart rhythms   Pt denies any upcoming cardiac testing  Patient's chart reviewed by Rogena Class CNRA prior to pre-visit and patient appropriate for the [facility].  Pre-visit completed and red dot placed by patient's name on their procedure day (on provider's schedule).    Visit by phone  Pt states weight is 230 lb  IInstructions reviewed. Pt given  both [facility] main # and MD on call # prior to instructions.  Pt states understanding of instructions. Instructed pt to review instructions again prior to procedure and call main # given if has questions.. Pt states they will.   Instructed pt on where to find instructions on My Chart.  If you use chewing tobacco or snuff, please DO NOT use after midnight the day of your procedure. If you do, your procedure will be cancelled !!

## 2023-11-13 ENCOUNTER — Encounter: Payer: Self-pay | Admitting: Gastroenterology

## 2023-11-13 ENCOUNTER — Ambulatory Visit (AMBULATORY_SURGERY_CENTER): Admitting: Gastroenterology

## 2023-11-13 VITALS — BP 115/74 | HR 61 | Temp 98.0°F | Resp 12 | Ht 74.0 in | Wt 230.0 lb

## 2023-11-13 DIAGNOSIS — Z1211 Encounter for screening for malignant neoplasm of colon: Secondary | ICD-10-CM

## 2023-11-13 DIAGNOSIS — K5730 Diverticulosis of large intestine without perforation or abscess without bleeding: Secondary | ICD-10-CM

## 2023-11-13 DIAGNOSIS — D123 Benign neoplasm of transverse colon: Secondary | ICD-10-CM

## 2023-11-13 DIAGNOSIS — Z860100 Personal history of colon polyps, unspecified: Secondary | ICD-10-CM

## 2023-11-13 DIAGNOSIS — D120 Benign neoplasm of cecum: Secondary | ICD-10-CM | POA: Diagnosis not present

## 2023-11-13 DIAGNOSIS — K648 Other hemorrhoids: Secondary | ICD-10-CM

## 2023-11-13 MED ORDER — SODIUM CHLORIDE 0.9 % IV SOLN: 500.0000 mL | Freq: Once | INTRAVENOUS | Status: DC

## 2023-11-13 NOTE — Progress Notes (Signed)
 Case delayed due to pt removed chewing tobacco until 0300, plan to perform case at 1100.

## 2023-11-13 NOTE — Progress Notes (Signed)
 Pt's states no medical or surgical changes since previsit or office visit.

## 2023-11-13 NOTE — Progress Notes (Signed)
 [name]   Primary Care Physician:  Cook, Jayce G, DO   Reason for Procedure:   History of colon polyps  Plan:    colonoscopy     HPI: Paul Barnes is a 58 y.o. male  here for colonoscopy surveillance. Last exam 09/2017 - 7 polyps, 3 adenomas and 1 SSP.   Patient denies any bowel symptoms at this time. No family history of colon cancer known. Otherwise feels well without any cardiopulmonary symptoms.   I have discussed risks / benefits of anesthesia and endoscopic procedure with Paul Barnes and they wish to proceed with the exams as outlined today.    Past Medical History:  Diagnosis Date   Allergic rhinitis    Allergy    Arthritis    Diabetes mellitus without complication (HCC)    History of kidney stones    Hyperlipidemia    not on meds    Past Surgical History:  Procedure Laterality Date   COLONOSCOPY     KNEE ARTHROSCOPY WITH MEDIAL MENISECTOMY Right 02/11/2014   Procedure: KNEE ARTHROSCOPY WITH MEDIAL MENISECTOMY AND CHONDROPLASTY;  Surgeon: Darrin Emerald, MD;  Location: [facility];  Service: Orthopedics;  Laterality: Right;   KNEE ARTHROSCOPY WITH MEDIAL MENISECTOMY Left 01/15/2018   Procedure: Chondroplasty medial  condyle  and trochlea left knee;  Surgeon: Darrin Emerald, MD;  Location: [facility];  Service: Orthopedics;  Laterality: Left;   PLANTAR FASCIA RELEASE Right 11/09/2014   Procedure: ENDOSCOPIC PLANTAR FASCIOTOMY;  Surgeon: Iverson Market, DPM;  Location: [facility];  Service: Podiatry;  Laterality: Right;   SHOULDER ARTHROSCOPY Right     Prior to Admission medications   Medication Sig Start Date End Date Taking? Authorizing Provider  Accu-Chek Softclix Lancets lancets Use to check sugars every day 07/19/21  Yes Cook, Jayce G, DO  Cholecalciferol (VITAMIN D3) 25 MCG (1000 UT) CAPS Take 1,000 Units by mouth daily.   Yes [provider]  clobetasol (TEMOVATE) 0.05 % external solution Apply 1  Application topically. 08/29/23  Yes [provider]  diclofenac  (VOLTAREN ) 75 MG EC tablet Take 75 mg by mouth. 08/13/21  Yes [provider]  fluticasone  (FLONASE ) 50 MCG/ACT nasal spray Place 2 sprays into both nostrils daily. 08/26/23  Yes Cook, Jayce G, DO  glipiZIDE  (GLUCOTROL  XL) 5 MG 24 hr tablet Take 2 tablets (10 mg total) by mouth daily with breakfast. 10/03/23  Yes Wendel Hals, NP  glucose blood (ACCU-CHEK GUIDE TEST) test strip Use as instructed to monitor glucose twice daily 10/03/23  Yes Reardon, Whitney J, NP  Multiple Vitamin (MULTIVITAMIN WITH MINERALS) TABS tablet Take 1 tablet by mouth daily.   Yes [provider]  sitaGLIPtin  (JANUVIA ) 100 MG tablet Take 1 tablet (100 mg total) by mouth daily. 10/03/23  Yes Wendel Hals, NP  Turmeric 400 MG CAPS Take by mouth.   Yes [provider]  albuterol  (VENTOLIN  HFA) 108 (90 Base) MCG/ACT inhaler Inhale 2 puffs into the lungs every 6 (six) hours as needed for wheezing or shortness of breath. 08/26/23   Cook, Jayce G, DO  ascorbic acid (VITAMIN C) 100 MG tablet Take 1 tablet by mouth daily.    [provider]  desonide (DESOWEN) 0.05 % cream Apply 1 Application topically.    [provider]  promethazine -dextromethorphan (PROMETHAZINE -DM) 6.25-15 MG/5ML syrup Take 5 mLs by mouth 4 (four) times daily as needed. Patient taking differently: Take 5 mLs by mouth 4 (four) times daily as  needed. First 3 days of the month 07/24/23   Cook, Jayce G, DO    Current Outpatient Medications  Medication Sig Dispense Refill   Accu-Chek Softclix Lancets lancets Use to check sugars every day 100 each 5   Cholecalciferol (VITAMIN D3) 25 MCG (1000 UT) CAPS Take 1,000 Units by mouth daily.     clobetasol (TEMOVATE) 0.05 % external solution Apply 1 Application topically.     diclofenac  (VOLTAREN ) 75 MG EC tablet Take 75 mg by mouth.     fluticasone  (FLONASE ) 50 MCG/ACT nasal spray Place 2 sprays into  both nostrils daily. 16 g 0   glipiZIDE  (GLUCOTROL  XL) 5 MG 24 hr tablet Take 2 tablets (10 mg total) by mouth daily with breakfast. 180 tablet 3   glucose blood (ACCU-CHEK GUIDE TEST) test strip Use as instructed to monitor glucose twice daily 100 each 12   Multiple Vitamin (MULTIVITAMIN WITH MINERALS) TABS tablet Take 1 tablet by mouth daily.     sitaGLIPtin  (JANUVIA ) 100 MG tablet Take 1 tablet (100 mg total) by mouth daily. 90 tablet 3   Turmeric 400 MG CAPS Take by mouth.     albuterol  (VENTOLIN  HFA) 108 (90 Base) MCG/ACT inhaler Inhale 2 puffs into the lungs every 6 (six) hours as needed for wheezing or shortness of breath. 18 g 2   ascorbic acid (VITAMIN C) 100 MG tablet Take 1 tablet by mouth daily.     desonide (DESOWEN) 0.05 % cream Apply 1 Application topically.     promethazine -dextromethorphan (PROMETHAZINE -DM) 6.25-15 MG/5ML syrup Take 5 mLs by mouth 4 (four) times daily as needed. (Patient taking differently: Take 5 mLs by mouth 4 (four) times daily as needed. First 3 days of the month) 118 mL 0   Current Facility-Administered Medications  Medication Dose Route Frequency Provider Last Rate Last Admin   0.9 %  sodium chloride  infusion  500 mL Intravenous Once Stancil Deisher, Lendon Queen, MD        Allergies as of 11/13/2023 - Review Complete 11/13/2023  Allergen Reaction Noted   Ozempic  (0.25 or 0.5 mg-dose) [semaglutide (0.25 or 0.5mg -dos)] Other (See Comments) 09/19/2022   Semaglutide  Other (See Comments) 09/19/2022    Family History  Problem Relation Age of Onset   Heart disease Unknown    Cancer Unknown    Diabetes Unknown    Cancer Mother    Heart disease Father    Colon cancer Neg Hx    Colon polyps Neg Hx    Esophageal cancer Neg Hx    Stomach cancer Neg Hx    Rectal cancer Neg Hx     Social History   Socioeconomic History   Marital status: Married    Spouse name: Not on file   Number of children: Not on file   Years of education: Not on file   Highest  education level: Not on file  Occupational History   Occupation: Curator  Tobacco Use   Smoking status: Never   Smokeless tobacco: Current    Types: Chew, Snuff    Last attempt to quit: 01/08/2017  Vaping Use   Vaping status: Never Used  Substance and Sexual Activity   Alcohol use: Yes    Comment: occassional   Drug use: No   Sexual activity: Yes    Birth control/protection: None  Other Topics Concern   Not on file  Social History Narrative   Not on file   Social Drivers of Health   Financial Resource Strain: Not on file  Food Insecurity:  Not on file  Transportation Needs: Not on file  Physical Activity: Not on file  Stress: Not on file  Social Connections: Not on file  Intimate Partner Violence: Not on file    Review of Systems: All other review of systems negative except as mentioned in the HPI.  Physical Exam: Vital signs BP 120/74   Pulse 71   Temp 98 F (36.7 C) (Temporal)   Ht 6' 2 (1.88 m)   Wt 230 lb (104.3 kg)   SpO2 96%   BMI 29.53 kg/m   General:   Alert,  Well-developed, pleasant and cooperative in NAD Lungs:  Clear throughout to auscultation.   Heart:  Regular rate and rhythm Abdomen:  Soft, nontender and nondistended.   Neuro/Psych:  Alert and cooperative. Normal mood and affect. A and O x 3  Christi Coward, MD [facility]

## 2023-11-13 NOTE — Progress Notes (Signed)
 Report given to PACU, vss

## 2023-11-13 NOTE — Patient Instructions (Signed)

## 2023-11-13 NOTE — Op Note (Signed)
 [facility] Patient Name: Paul Barnes Procedure Date: 11/13/2023 10:27 AM MRN: [id number] Endoscopist: Landon Pinion P. General Kenner , MD, [id number] Age: 58 Referring MD:  Date of Birth: Aug 24, 1965 Gender: Male Account #: [id number] Procedure:                Colonoscopy Indications:              High risk colon cancer surveillance: Personal                            history of colonic polyps - last exam 09/2017 - 3                            adenomas / 1 sessile serrated Medicines:                Monitored Anesthesia Care Procedure:                Pre-Anesthesia Assessment:                           - Prior to the procedure, a History and Physical                            was performed, and patient medications and                            allergies were reviewed. The patient's tolerance of                            previous anesthesia was also reviewed. The risks                            and benefits of the procedure and the sedation                            options and risks were discussed with the patient.                            All questions were answered, and informed consent                            was obtained. Prior Anticoagulants: The patient has                            taken no anticoagulant or antiplatelet agents. ASA                            Grade Assessment: II - A patient with mild systemic                            disease. After reviewing the risks and benefits,                            the patient was deemed in satisfactory condition to  undergo the procedure.                           After obtaining informed consent, the colonoscope                            was passed under direct vision. Throughout the                            procedure, the patient's blood pressure, pulse, and                            oxygen saturations were monitored continuously. The                            CF [id number] #[id number] was  introduced through the anus                            and advanced to the the cecum, identified by                            appendiceal orifice and ileocecal valve. The                            colonoscopy was performed without difficulty. The                            patient tolerated the procedure well. The quality                            of the bowel preparation was good. The ileocecal                            valve, appendiceal orifice, and rectum were                            photographed. Scope In: 11:17:15 AM Scope Out: 11:34:10 AM Scope Withdrawal Time: 0 hours 11 minutes 24 seconds  Total Procedure Duration: 0 hours 16 minutes 55 seconds  Findings:                 The perianal and digital rectal examinations were                            normal.                           Two flat and sessile polyps were found in the                            cecum. The polyps were 2 to 3 mm in size. These                            polyps were removed with a cold snare. Resection  and retrieval were complete.                           Two flat and sessile polyps were found in the                            transverse colon. The polyps were 3 mm in size.                            These polyps were removed with a cold snare.                            Resection and retrieval were complete.                           Diverticula were found in the sigmoid colon.                           Internal hemorrhoids were found during retroflexion.                           The exam was otherwise without abnormality. Complications:            No immediate complications. Estimated blood loss:                            Minimal. Estimated Blood Loss:     Estimated blood loss was minimal. Impression:               - Two 2 to 3 mm polyps in the cecum, removed with a                            cold snare. Resected and retrieved.                           - Two 3 mm  polyps in the transverse colon, removed                            with a cold snare. Resected and retrieved.                           - Diverticulosis in the sigmoid colon.                           - Internal hemorrhoids.                           - The examination was otherwise normal. Recommendation:           - Patient has a contact number available for                            emergencies. The signs and symptoms of potential  delayed complications were discussed with the                            patient. Return to normal activities tomorrow.                            Written discharge instructions were provided to the                            patient.                           - Resume previous diet.                           - Continue present medications.                           - Await pathology results. Landon Pinion P. Clio Gerhart, MD 11/13/2023 11:43:14 AM This report has been signed electronically.

## 2023-11-14 ENCOUNTER — Telehealth: Payer: Self-pay | Admitting: *Deleted

## 2023-11-14 NOTE — Telephone Encounter (Signed)
  Follow up Call-     11/13/2023   10:17 AM  Call back number  Post procedure Call Back phone  # [phone]  Permission to leave phone message Yes     Patient questions:  Do you have a fever, pain , or abdominal swelling? No. Pain Score  0 *  Have you tolerated food without any problems? Yes.    Have you been able to return to your normal activities? Yes.    Do you have any questions about your discharge instructions: Diet   No. Medications  No. Follow up visit  No.  Do you have questions or concerns about your Care? No.  Actions: * If pain score is 4 or above: No action needed, pain <4.

## 2023-11-17 LAB — SURGICAL PATHOLOGY

## 2023-11-18 ENCOUNTER — Ambulatory Visit: Payer: Self-pay | Admitting: Gastroenterology

## 2023-12-30 ENCOUNTER — Other Ambulatory Visit: Payer: Self-pay | Admitting: Family Medicine

## 2024-01-14 ENCOUNTER — Telehealth: Payer: Self-pay | Admitting: Family Medicine

## 2024-01-14 NOTE — Telephone Encounter (Signed)
 Copied from CRM #[id number]. Topic: Clinical - Medication Refill >> Jan 14, 2024  4:34 PM Marissa P wrote: Medication: Trazodone  50mg   Has the patient contacted their pharmacy? Yes (Agent: If no, request that the patient contact the pharmacy for the refill. If patient does not wish to contact the pharmacy document the reason why and proceed with request.) (Agent: If yes, when and what did the pharmacy advise?)  This is the patient's preferred pharmacy:  [facility] [phone] - [place], [place] - [address] AT [facility] OF [address] [place] [address] Phone: [phone] Fax: [phone]   Is this the correct pharmacy for this prescription? Yes If no, delete pharmacy and type the correct one.   Has the prescription been filled recently? Yes  Is the patient out of the medication? Yes  Has the patient been seen for an appointment in the last year OR does the patient have an upcoming appointment? Yes  Can we respond through MyChart? Yes  Agent: Please be advised that Rx refills may take up to 3 business days. We ask that you follow-up with your pharmacy.

## 2024-01-14 NOTE — Telephone Encounter (Signed)
 Patient requesting Trazodone  50 mg not in med profile to be sent to   [facility] [phone] - [place], [place] - [address] AT [facility] OF [address]  [place] [address] Phone: [phone] Fax: [phone]

## 2024-01-15 ENCOUNTER — Other Ambulatory Visit: Payer: Self-pay | Admitting: Family Medicine

## 2024-01-15 MED ORDER — TRAZODONE HCL 50 MG PO TABS: 50.0000 mg | ORAL_TABLET | Freq: Every evening | ORAL | 1 refills | Status: DC | PRN

## 2024-01-16 NOTE — Telephone Encounter (Signed)
 Cook, Jayce G, [place]     01/15/24 10:02 PM Rx sent.

## 2024-01-21 ENCOUNTER — Encounter: Payer: Self-pay | Admitting: Nurse Practitioner

## 2024-01-21 ENCOUNTER — Ambulatory Visit: Admitting: Nurse Practitioner

## 2024-01-21 VITALS — BP 130/80 | HR 74 | Ht 74.0 in | Wt 246.8 lb

## 2024-01-21 DIAGNOSIS — E1165 Type 2 diabetes mellitus with hyperglycemia: Secondary | ICD-10-CM

## 2024-01-21 DIAGNOSIS — E782 Mixed hyperlipidemia: Secondary | ICD-10-CM | POA: Diagnosis not present

## 2024-01-21 DIAGNOSIS — Z7985 Long-term (current) use of injectable non-insulin antidiabetic drugs: Secondary | ICD-10-CM

## 2024-01-21 DIAGNOSIS — I10 Essential (primary) hypertension: Secondary | ICD-10-CM

## 2024-01-21 LAB — POCT GLYCOSYLATED HEMOGLOBIN (HGB A1C): Hemoglobin A1C: 9.2 % — AB (ref 4.0–5.6)

## 2024-01-21 NOTE — Progress Notes (Signed)
 Endocrinology Follow Up Note       01/21/2024, 11:39 AM   Subjective:    Patient ID: Paul Barnes, male    DOB: 1965-10-21.  Paul Barnes is being seen in follow up after being seen in consultation for management of currently uncontrolled symptomatic diabetes requested by  Cook, Jayce G, DO.   Past Medical History:  Diagnosis Date   Allergic rhinitis    Allergy    Arthritis    Diabetes mellitus without complication (HCC)    History of kidney stones    Hyperlipidemia    not on meds    Past Surgical History:  Procedure Laterality Date   COLONOSCOPY     KNEE ARTHROSCOPY WITH MEDIAL MENISECTOMY Right 02/11/2014   Procedure: KNEE ARTHROSCOPY WITH MEDIAL MENISECTOMY AND CHONDROPLASTY;  Surgeon: Taft FORBES Minerva, MD;  Location: [facility];  Service: Orthopedics;  Laterality: Right;   KNEE ARTHROSCOPY WITH MEDIAL MENISECTOMY Left 01/15/2018   Procedure: Chondroplasty medial  condyle  and trochlea left knee;  Surgeon: Minerva Taft FORBES, MD;  Location: [facility];  Service: Orthopedics;  Laterality: Left;   PLANTAR FASCIA RELEASE Right 11/09/2014   Procedure: ENDOSCOPIC PLANTAR FASCIOTOMY;  Surgeon: Morene Anon, DPM;  Location: [facility];  Service: Podiatry;  Laterality: Right;   SHOULDER ARTHROSCOPY Right     Social History   Socioeconomic History   Marital status: Married    Spouse name: Not on file   Number of children: Not on file   Years of education: Not on file   Highest education level: Not on file  Occupational History   Occupation: Curator  Tobacco Use   Smoking status: Never   Smokeless tobacco: Current    Types: Chew, Snuff    Last attempt to quit: 01/08/2017  Vaping Use   Vaping status: Never Used  Substance and Sexual Activity   Alcohol use: Yes    Comment: occassional   Drug use: No   Sexual activity: Yes    Birth control/protection: None  Other Topics Concern   Not on file   Social History Narrative   Not on file   Social Drivers of Health   Financial Resource Strain: Not on file  Food Insecurity: Not on file  Transportation Needs: Not on file  Physical Activity: Not on file  Stress: Not on file  Social Connections: Not on file    Family History  Problem Relation Age of Onset   Heart disease Unknown    Cancer Unknown    Diabetes Unknown    Cancer Mother    Heart disease Father    Colon cancer Neg Hx    Colon polyps Neg Hx    Esophageal cancer Neg Hx    Stomach cancer Neg Hx    Rectal cancer Neg Hx     Outpatient Encounter Medications as of 01/21/2024  Medication Sig   Accu-Chek Softclix Lancets lancets Use to check sugars every day   albuterol  (VENTOLIN  HFA) 108 (90 Base) MCG/ACT inhaler Inhale 2 puffs into the lungs every 6 (six) hours as needed for wheezing or shortness of breath.   ascorbic acid (VITAMIN C) 100 MG tablet Take 1 tablet by mouth daily.  Cholecalciferol (VITAMIN D3) 25 MCG (1000 UT) CAPS Take 1,000 Units by mouth daily.   clobetasol (TEMOVATE) 0.05 % external solution Apply 1 Application topically.   desonide (DESOWEN) 0.05 % cream Apply 1 Application topically.   diclofenac  (VOLTAREN ) 75 MG EC tablet Take 75 mg by mouth.   fluticasone  (FLONASE ) 50 MCG/ACT nasal spray SHAKE LIQUID AND USE 2 SPRAYS IN EACH NOSTRIL DAILY   glipiZIDE  (GLUCOTROL  XL) 5 MG 24 hr tablet Take 2 tablets (10 mg total) by mouth daily with breakfast.   glucose blood (ACCU-CHEK GUIDE TEST) test strip Use as instructed to monitor glucose twice daily   Multiple Vitamin (MULTIVITAMIN WITH MINERALS) TABS tablet Take 1 tablet by mouth daily.   OVER THE COUNTER MEDICATION Take 1 capsule by mouth daily. Patient shares that this is Beet Root   promethazine -dextromethorphan (PROMETHAZINE -DM) 6.25-15 MG/5ML syrup Take 5 mLs by mouth 4 (four) times daily as needed. (Patient taking differently: Take 5 mLs by mouth 4 (four) times daily as needed. First 3 days of the  month)   sitaGLIPtin  (JANUVIA ) 100 MG tablet Take 1 tablet (100 mg total) by mouth daily.   traZODone  (DESYREL ) 50 MG tablet Take 1 tablet (50 mg total) by mouth at bedtime as needed for sleep.   Turmeric 400 MG CAPS Take by mouth.   No facility-administered encounter medications on file as of 01/21/2024.    ALLERGIES: Allergies  Allergen Reactions   Ozempic  (0.25 Or 0.5 Mg-Dose) [Semaglutide (0.25 Or 0.5mg -Dos)] Other (See Comments)    Patient started having GI issues, Gallbladder attack   Semaglutide  Other (See Comments)    Patient started having GI issues, Gallbladder attack    VACCINATION STATUS:  There is no immunization history on file for this patient.  Diabetes He presents for his follow-up diabetic visit. He has type 2 diabetes mellitus. Onset time: Diagnosed at approx age of 58. His disease course has been improving. There are no hypoglycemic associated symptoms. Associated symptoms include fatigue and foot paresthesias. Pertinent negatives for diabetes include no polydipsia and no polyuria. There are no hypoglycemic complications. Symptoms are stable. Diabetic complications include nephropathy. Risk factors for coronary artery disease include diabetes mellitus, dyslipidemia, hypertension and male sex. Current diabetic treatment includes oral agent (dual therapy). His weight is increasing steadily. He is following a generally healthy diet. When asked about meal planning, he reported none. He has not had a previous visit with a dietitian. He participates in exercise intermittently. His home blood glucose trend is decreasing steadily. His breakfast blood glucose range is generally 140-180 mg/dl. (He presents today with his logs showing above target fasting glycemic profile.  His POCT A1c today is 9.2%, improving from last visit of 10.5%.  He continues to take Prednisone  for his alopecia at recommendation of his dermatologist, but only takes it 3 days per month now and has plans to stop  this in the future.  He also notes he has done better with diet and exercise recently.  He admits stress still plays a role in his glucose as well.) An ACE inhibitor/angiotensin II receptor blocker is not being taken. He does not see a podiatrist.Eye exam is current.     Review of systems  Constitutional: + increasing body weight,  current Body mass index is 31.69 kg/m. , no fatigue, no subjective hyperthermia, no subjective hypothermia Eyes: no blurry vision, no xerophthalmia ENT: no sore throat, no nodules palpated in throat, no dysphagia/odynophagia, no hoarseness Cardiovascular: no chest pain, no shortness of breath, no palpitations, no  leg swelling Respiratory: no cough, no shortness of breath Gastrointestinal: no nausea/vomiting/diarrhea Musculoskeletal: no muscle/joint aches Skin: no rashes, no hyperemia, + hair loss-improving somewhat Neurological: no tremors, no numbness, no tingling, no dizziness Psychiatric: no depression, no anxiety, + increased stress  Objective:     BP 130/80 (BP Location: Left Arm, Patient Position: Sitting, Cuff Size: Large)   Pulse 74   Ht 6' 2 (1.88 m)   Wt 246 lb 12.8 oz (111.9 kg)   BMI 31.69 kg/m   Wt Readings from Last 3 Encounters:  01/21/24 246 lb 12.8 oz (111.9 kg)  11/13/23 230 lb (104.3 kg)  10/30/23 230 lb (104.3 kg)     BP Readings from Last 3 Encounters:  01/21/24 130/80  11/13/23 115/74  10/03/23 122/82      Physical Exam- Limited  Constitutional:  Body mass index is 31.69 kg/m. , not in acute distress, normal state of mind Eyes:  EOMI, no exophthalmos Musculoskeletal: no gross deformities, strength intact in all four extremities, no gross restriction of joint movements Skin:  no rashes, no hyperemia, + patchy hair loss to head and face (missing his right eyebrow- now growing back somewhat)  Neurological: no tremor with outstretched hands  Diabetic Foot Exam - Simple   No data filed     CMP ( most recent) CMP      Component Value Date/Time   NA 137 05/08/2023 1234   K 4.4 05/08/2023 1234   CL 98 05/08/2023 1234   CO2 22 05/08/2023 1234   GLUCOSE 289 (H) 05/08/2023 1234   GLUCOSE 154 (H) 01/08/2018 1357   BUN 15 05/08/2023 1234   CREATININE 1.04 05/08/2023 1234   CALCIUM  9.7 05/08/2023 1234   PROT 6.9 05/08/2023 1234   ALBUMIN 4.6 05/08/2023 1234   AST 12 05/08/2023 1234   ALT 17 05/08/2023 1234   ALKPHOS 98 05/08/2023 1234   BILITOT 1.0 05/08/2023 1234   GFRNONAA 79 09/27/2019 0858   GFRAA 91 09/27/2019 0858     Diabetic Labs (most recent): Lab Results  Component Value Date   HGBA1C 9.2 (A) 01/21/2024   HGBA1C 10.5 (A) 10/03/2023   HGBA1C 9.9 (A) 05/15/2023   MICROALBUR 30 09/10/2021     Lipid Panel ( most recent) Lipid Panel     Component Value Date/Time   CHOL 272 (H) 05/08/2023 1234   TRIG 280 (H) 05/08/2023 1234   HDL 48 05/08/2023 1234   CHOLHDL 5.7 (H) 05/08/2023 1234   LDLCALC 171 (H) 05/08/2023 1234   LABVLDL 53 (H) 05/08/2023 1234      Lab Results  Component Value Date   TSH 2.240 05/08/2023   FREET4 1.19 05/08/2023           Assessment & Plan:   1) Type 2 diabetes mellitus with hyperglycemia, without long-term current use of insulin (HCC)  He presents today with his logs showing above target fasting glycemic profile.  His POCT A1c today is 9.2%, improving from last visit of 10.5%.  He continues to take Prednisone  for his alopecia at recommendation of his dermatologist, but only takes it 3 days per month now and has plans to stop this in the future.  He also notes he has done better with diet and exercise recently.  He admits stress still plays a role in his glucose as well.  Paul Barnes has currently uncontrolled symptomatic type 2 DM since 58 years of age.   -Recent labs reviewed.  - I had a long discussion with  him about the progressive nature of diabetes and the pathology behind its complications. -his diabetes is complicated by mild CKD  and neuropathy and he remains at a high risk for more acute and chronic complications which include CAD, CVA, CKD, retinopathy, and neuropathy. These are all discussed in detail with him.  - Nutritional counseling repeated at each appointment due to patients tendency to fall back in to old habits.  - The patient admits there is a room for improvement in their diet and drink choices. -  Suggestion is made for the patient to avoid simple carbohydrates from their diet including Cakes, Sweet Desserts / Pastries, Ice Cream, Soda (diet and regular), Sweet Tea, Candies, Chips, Cookies, Sweet Pastries, Store Bought Juices, Alcohol in Excess of 1-2 drinks a day, Artificial Sweeteners, Coffee Creamer, and Sugar-free Products. This will help patient to have stable blood glucose profile and potentially avoid unintended weight gain.   - I encouraged the patient to switch to unprocessed or minimally processed complex starch and increased protein intake (animal or plant source), fruits, and vegetables.   - Patient is advised to stick to a routine mealtimes to eat 3 meals a day and avoid unnecessary snacks (to snack only to correct hypoglycemia).  - he will be scheduled with Penny Crumpton, RDN, CDE for diabetes education.  - I have approached him with the following individualized plan to manage his diabetes and patient agrees:   -He is advised to continue Januvia  100 mg po daily and continue Glipizide  10 mg XL daily at breakfast (especially on days he is taking his prednisone ).  We may need to revamp our plan if steroids are needed for a longer term.  He is aware that basal insulin may be our next step to help regain control of diabetes.  Will give him a bit longer as the steroid treatments should be winding down soon.  -he is advised to monitor glucose once daily, before breakfast and any other time if experiencing symptoms and to call the clinic if he has readings less than 70 or above 300 for 3 tests in a  row.  He promises to reach out if glucose is not better within the next several weeks so we can start insulin.  - Adjustment parameters are given to him for hypo and hyperglycemia in writing.  - Specific targets for  A1c; LDL, HDL, and Triglycerides were discussed with the patient.  2) Blood Pressure /Hypertension:  his blood pressure is controlled to target without the use of antihypertensive medications.  He will be considered for low dose ACE/ARB on subsequent visits if BP is above 140/90 on 3 separate occasions.  3) Lipids/Hyperlipidemia:    Review of his recent lipid panel from 05/08/23 showed uncontrolled LDL at 171 (worsening) but greatly improved triglycerides of 280. He does report significant myalgias with his statin, therefore he hopes to be taken off it in the future with lifestyle changes.  4)  Weight/Diet:  his Body mass index is 31.69 kg/m.  -  clearly complicating his diabetes care.   he is a candidate for weight loss. I discussed with him the fact that loss of 5 - 10% of his  current body weight will have the most impact on his diabetes management.  Exercise, and detailed carbohydrates information provided  -  detailed on discharge instructions.  5) Chronic Care/Health Maintenance: -he is not on ACEI/ARB or Statin medications and is encouraged to initiate and continue to follow up with Ophthalmology, Dentist, Podiatrist at least  yearly or according to recommendations, and advised to stay away from smoking. I have recommended yearly flu vaccine and pneumonia vaccine at least every 5 years; moderate intensity exercise for up to 150 minutes weekly; and sleep for at least 7 hours a day.  - he is advised to maintain close follow up with Cook, Jayce G, DO for primary care needs, as well as his other providers for optimal and coordinated care.      I spent  41  minutes in the care of the patient today including review of labs from CMP, Lipids, Thyroid  Function, Hematology (current  and previous including abstractions from other facilities); face-to-face time discussing  his blood glucose readings/logs, discussing hypoglycemia and hyperglycemia episodes and symptoms, medications doses, his options of short and long term treatment based on the latest standards of care / guidelines;  discussion about incorporating lifestyle medicine;  and documenting the encounter. Risk reduction counseling performed per USPSTF guidelines to reduce obesity and cardiovascular risk factors.     Please refer to Patient Instructions for Blood Glucose Monitoring and Insulin/Medications Dosing Guide  in media tab for additional information. Please  also refer to  Patient Self Inventory in the Media  tab for reviewed elements of pertinent patient history.  Vinie LITTIE Barnes participated in the discussions, expressed understanding, and voiced agreement with the above plans.  All questions were answered to his satisfaction. he is encouraged to contact clinic should he have any questions or concerns prior to his return visit.     Follow up plan: - Return in about 3 months (around 04/22/2024) for Diabetes F/U with A1c in office, No previsit labs, Bring meter and logs.  Benton Rio, [facility] [address] [place], [place] [address] Phone: [phone] Fax: [phone]  01/21/2024, 11:39 AM

## 2024-01-22 ENCOUNTER — Other Ambulatory Visit: Payer: Self-pay | Admitting: *Deleted

## 2024-01-22 ENCOUNTER — Telehealth: Payer: Self-pay | Admitting: *Deleted

## 2024-01-22 DIAGNOSIS — E1165 Type 2 diabetes mellitus with hyperglycemia: Secondary | ICD-10-CM

## 2024-01-22 MED ORDER — ACCU-CHEK AVIVA PLUS W/DEVICE KIT: PACK | 0 refills | Status: DC

## 2024-01-22 NOTE — Telephone Encounter (Signed)
 Patient left a message that he needed an [employer] called in , then he shared that he needed Accu Avia Plus 50's test strips. He provided this number for these test strips [phone].  I called the patient and left a detailed message to let us  know which he was needing, we would be happy to take care of it.

## 2024-02-03 ENCOUNTER — Other Ambulatory Visit: Payer: Self-pay | Admitting: Family Medicine

## 2024-02-08 ENCOUNTER — Other Ambulatory Visit: Payer: Self-pay | Admitting: Family Medicine

## 2024-03-01 ENCOUNTER — Ambulatory Visit: Admitting: Family Medicine

## 2024-03-01 ENCOUNTER — Encounter: Payer: Self-pay | Admitting: Family Medicine

## 2024-03-01 DIAGNOSIS — E782 Mixed hyperlipidemia: Secondary | ICD-10-CM

## 2024-03-01 DIAGNOSIS — M170 Bilateral primary osteoarthritis of knee: Secondary | ICD-10-CM | POA: Diagnosis not present

## 2024-03-01 DIAGNOSIS — E119 Type 2 diabetes mellitus without complications: Secondary | ICD-10-CM | POA: Diagnosis not present

## 2024-03-01 DIAGNOSIS — F5101 Primary insomnia: Secondary | ICD-10-CM

## 2024-03-01 DIAGNOSIS — Z130 Encounter for screening for diseases of the blood and blood-forming organs and certain disorders involving the immune mechanism: Secondary | ICD-10-CM | POA: Diagnosis not present

## 2024-03-01 MED ORDER — DICLOFENAC SODIUM 75 MG PO TBEC: 75.0000 mg | DELAYED_RELEASE_TABLET | Freq: Two times a day (BID) | ORAL | 1 refills | Status: DC | PRN

## 2024-03-01 NOTE — Patient Instructions (Signed)
 Labs today.  Medication sent in.  Follow up in 6 months   Take care  Dr. Bluford

## 2024-03-02 NOTE — Assessment & Plan Note (Signed)
 Currently having pain. Rx sent in for Diclofenac .

## 2024-03-02 NOTE — Assessment & Plan Note (Signed)
 Lipid panel ordered.

## 2024-03-02 NOTE — Assessment & Plan Note (Signed)
 Stable on Trazodone . Continue.

## 2024-03-02 NOTE — Assessment & Plan Note (Signed)
 Uncontrolled.  Close follow-up with endocrinology.

## 2024-03-02 NOTE — Progress Notes (Signed)
 Subjective:  Patient ID: Paul Barnes, male    DOB: June 17, 1965  Age: 58 y.o. MRN: [id number]  CC:   Chief Complaint  Patient presents with   Diabetes    Six month follow up    HPI:  58 year old male presents for follow-up.  Overall doing well.  Does report knee pain.  Has underlying osteoarthritis.  Requesting refill on diclofenac .  Type 2 diabetes uncontrolled.  Follows with endocrinology.  Currently on glipizide  and Januvia .  Needs foot exam today.  Insomnia stable on trazodone .  Needs labs.  Patient Active Problem List   Diagnosis Date Noted   Alopecia 07/24/2022   Insomnia 10/16/2021   Hyperlipidemia 07/19/2021   Erectile dysfunction 07/19/2021   Osteoarthritis of both knees 07/19/2021   Umbilical hernia 07/19/2021   S/P left knee arthroscopy 01/15/18  01/22/2018   Type 2 diabetes mellitus without complications (HCC) 09/18/2013    Social Hx   Social History   Socioeconomic History   Marital status: Married    Spouse name: Not on file   Number of children: Not on file   Years of education: Not on file   Highest education level: Not on file  Occupational History   Occupation: Curator  Tobacco Use   Smoking status: Never   Smokeless tobacco: Current    Types: Chew, Snuff    Last attempt to quit: 01/08/2017  Vaping Use   Vaping status: Never Used  Substance and Sexual Activity   Alcohol use: Yes    Comment: occassional   Drug use: No   Sexual activity: Yes    Birth control/protection: None  Other Topics Concern   Not on file  Social History Narrative   Not on file   Social Drivers of Health   Financial Resource Strain: Not on file  Food Insecurity: Not on file  Transportation Needs: Not on file  Physical Activity: Not on file  Stress: Not on file  Social Connections: Not on file    Review of Systems Per HPI  Objective:  BP (!) 149/84   Pulse 73   Ht 6' 2 (1.88 m)   Wt 250 lb (113.4 kg)   SpO2 98%   BMI 32.10 kg/m       03/01/2024   11:15 AM 01/21/2024   10:21 AM 11/13/2023   11:57 AM  BP/Weight  Systolic BP 149 130 115  Diastolic BP 84 80 74  Wt. (Lbs) 250 246.8   BMI 32.1 kg/m2 31.69 kg/m2     Physical Exam Vitals and nursing note reviewed.  Constitutional:      General: He is not in acute distress.    Appearance: Normal appearance.  HENT:     Head: Normocephalic and atraumatic.  Eyes:     General:        Right eye: No discharge.        Left eye: No discharge.     Conjunctiva/sclera: Conjunctivae normal.  Cardiovascular:     Rate and Rhythm: Normal rate and regular rhythm.  Pulmonary:     Effort: Pulmonary effort is normal.     Breath sounds: Normal breath sounds. No wheezing, rhonchi or rales.  Feet:     Comments: Diabetic foot exam performed today. See Quality metrics section. Neurological:     Mental Status: He is alert.  Psychiatric:        Mood and Affect: Mood normal.        Behavior: Behavior normal.     Lab  Results  Component Value Date   WBC 6.6 07/24/2022   HGB 16.2 07/24/2022   HCT 48.7 07/24/2022   PLT 322 07/24/2022   GLUCOSE 289 (H) 05/08/2023   CHOL 272 (H) 05/08/2023   TRIG 280 (H) 05/08/2023   HDL 48 05/08/2023   LDLCALC 171 (H) 05/08/2023   ALT 17 05/08/2023   AST 12 05/08/2023   NA 137 05/08/2023   K 4.4 05/08/2023   CL 98 05/08/2023   CREATININE 1.04 05/08/2023   BUN 15 05/08/2023   CO2 22 05/08/2023   TSH 2.240 05/08/2023   HGBA1C 9.2 (A) 01/21/2024   MICROALBUR 30 09/10/2021     Assessment & Plan:  Type 2 diabetes mellitus without complication, without long-term current use of insulin (HCC) Assessment & Plan: Uncontrolled.  Close follow-up with endocrinology.  Orders: -     CMP14+EGFR -     Microalbumin / creatinine urine ratio  Mixed hyperlipidemia Assessment & Plan: Lipid panel ordered.  Orders: -     Lipid panel  Screening for deficiency anemia -     CBC  Primary osteoarthritis of both knees Assessment & Plan: Currently  having pain. Rx sent in for Diclofenac .  Orders: -     Diclofenac  Sodium; Take 1 tablet (75 mg total) by mouth 2 (two) times daily as needed for moderate pain (pain score 4-6).  Dispense: 60 tablet; Refill: 1  Primary insomnia Assessment & Plan: Stable on Trazodone . Continue.     Follow-up:  6 months  Kaizlee Carlino Bluford DO [facility]

## 2024-04-01 ENCOUNTER — Ambulatory Visit

## 2024-04-02 ENCOUNTER — Ambulatory Visit

## 2024-04-18 ENCOUNTER — Other Ambulatory Visit: Payer: Self-pay | Admitting: Family Medicine

## 2024-04-18 DIAGNOSIS — M170 Bilateral primary osteoarthritis of knee: Secondary | ICD-10-CM

## 2024-04-27 ENCOUNTER — Telehealth: Payer: Self-pay | Admitting: *Deleted

## 2024-04-27 ENCOUNTER — Ambulatory Visit: Admitting: Nurse Practitioner

## 2024-04-27 DIAGNOSIS — Z7985 Long-term (current) use of injectable non-insulin antidiabetic drugs: Secondary | ICD-10-CM

## 2024-04-27 DIAGNOSIS — I10 Essential (primary) hypertension: Secondary | ICD-10-CM

## 2024-04-27 DIAGNOSIS — E1165 Type 2 diabetes mellitus with hyperglycemia: Secondary | ICD-10-CM

## 2024-04-27 DIAGNOSIS — E782 Mixed hyperlipidemia: Secondary | ICD-10-CM

## 2024-04-27 MED ORDER — ACCU-CHEK AVIVA PLUS W/DEVICE KIT: PACK | 0 refills | Status: AC

## 2024-04-27 NOTE — Telephone Encounter (Signed)
 Accu Chek Avia Plus is the strips that he has.

## 2024-04-27 NOTE — Telephone Encounter (Signed)
 Can you just verify what meter he is currently using?  Dont want to send in the wrong supplies.

## 2024-04-27 NOTE — Telephone Encounter (Signed)
 Patient was called and a VM was left sharing that the meter had been called to [facility].

## 2024-04-27 NOTE — Telephone Encounter (Signed)
 Ok, I just sent in the meter for him to [facility].

## 2024-04-27 NOTE — Telephone Encounter (Signed)
 Patient left a message that he was needing to cancel appointment today, that he would need to reschedule. This was left on VM 04/26/2024@ 4:52 PM. He is also having trouble with [facility] and his insurance paying for his last lab work.So he will get the newest lab work once that is resolved. Leila never got his Meter, he has test strips. They suggested and the patient would like for us  to send in a RX to [place] for the meter.

## 2024-04-28 ENCOUNTER — Ambulatory Visit: Admitting: Nurse Practitioner

## 2024-04-28 ENCOUNTER — Telehealth: Payer: Self-pay | Admitting: Nurse Practitioner

## 2024-04-28 ENCOUNTER — Other Ambulatory Visit: Payer: Self-pay | Admitting: Nurse Practitioner

## 2024-04-28 ENCOUNTER — Encounter: Payer: Self-pay | Admitting: Nurse Practitioner

## 2024-04-28 VITALS — BP 122/78 | HR 71 | Ht 74.0 in | Wt 251.6 lb

## 2024-04-28 DIAGNOSIS — E1165 Type 2 diabetes mellitus with hyperglycemia: Secondary | ICD-10-CM | POA: Diagnosis not present

## 2024-04-28 DIAGNOSIS — Z7985 Long-term (current) use of injectable non-insulin antidiabetic drugs: Secondary | ICD-10-CM | POA: Diagnosis not present

## 2024-04-28 DIAGNOSIS — E782 Mixed hyperlipidemia: Secondary | ICD-10-CM

## 2024-04-28 DIAGNOSIS — I10 Essential (primary) hypertension: Secondary | ICD-10-CM | POA: Diagnosis not present

## 2024-04-28 DIAGNOSIS — L659 Nonscarring hair loss, unspecified: Secondary | ICD-10-CM

## 2024-04-28 DIAGNOSIS — R5382 Chronic fatigue, unspecified: Secondary | ICD-10-CM

## 2024-04-28 LAB — POCT GLYCOSYLATED HEMOGLOBIN (HGB A1C): Hemoglobin A1C: 10 % — AB (ref 4.0–5.6)

## 2024-04-28 MED ORDER — EMPAGLIFLOZIN 10 MG PO TABS: 10.0000 mg | ORAL_TABLET | Freq: Every day | ORAL | 1 refills | Status: AC

## 2024-04-28 MED ORDER — ACCU-CHEK AVIVA PLUS VI STRP: ORAL_STRIP | 12 refills | Status: DC

## 2024-04-28 MED ORDER — ACCU-CHEK AVIVA PLUS VI STRP: ORAL_STRIP | 12 refills | Status: AC

## 2024-04-28 MED ORDER — ACCU-CHEK SOFTCLIX LANCETS MISC: 5 refills | Status: AC

## 2024-04-28 MED ORDER — OZEMPIC (0.25 OR 0.5 MG/DOSE) 2 MG/3ML ~~LOC~~ SOPN: 0.5000 mg | PEN_INJECTOR | SUBCUTANEOUS | 1 refills | Status: AC

## 2024-04-28 NOTE — Telephone Encounter (Signed)
 Will you call pt with instructions from [facility]

## 2024-04-28 NOTE — Telephone Encounter (Signed)
 Will retry Ozempic  0.25 mg SQ weekly x 2 weeks, then increase to 0.5 mg SQ weekly if tolerated well.  I forgot to give him directions when I gave him the sample pen.  Can you call and tell him?  Also, I can send in script for this just to see if it is affordable as well.

## 2024-04-28 NOTE — Telephone Encounter (Signed)
 Pt states that jardiance  is 450 after insurance

## 2024-04-28 NOTE — Telephone Encounter (Signed)
 Patient made aware of Ozempic  and would like the script called into pharmacy to see if it is affordable. Thanks

## 2024-04-28 NOTE — Progress Notes (Signed)
 Endocrinology Follow Up Note       04/28/2024, 11:10 AM   Subjective:    Patient ID: Paul Barnes, male    DOB: [birth year].  Paul Barnes is being seen in follow up after being seen in consultation for management of currently uncontrolled symptomatic diabetes requested by  Cook, Jayce G, DO.   Past Medical History:  Diagnosis Date   Allergic rhinitis    Allergy    Arthritis    Diabetes mellitus without complication (HCC)    History of kidney stones    Hyperlipidemia    not on meds    Past Surgical History:  Procedure Laterality Date   COLONOSCOPY     KNEE ARTHROSCOPY WITH MEDIAL MENISECTOMY Right 02/11/2014   Procedure: KNEE ARTHROSCOPY WITH MEDIAL MENISECTOMY AND CHONDROPLASTY;  Surgeon: Taft FORBES Minerva, MD;  Location: [facility];  Service: Orthopedics;  Laterality: Right;   KNEE ARTHROSCOPY WITH MEDIAL MENISECTOMY Left 01/15/2018   Procedure: Chondroplasty medial  condyle  and trochlea left knee;  Surgeon: Minerva Taft FORBES, MD;  Location: [facility];  Service: Orthopedics;  Laterality: Left;   PLANTAR FASCIA RELEASE Right 11/09/2014   Procedure: ENDOSCOPIC PLANTAR FASCIOTOMY;  Surgeon: Morene Anon, DPM;  Location: [facility];  Service: Podiatry;  Laterality: Right;   SHOULDER ARTHROSCOPY Right     Social History   Socioeconomic History   Marital status: Married    Spouse name: Not on file   Number of children: Not on file   Years of education: Not on file   Highest education level: Not on file  Occupational History   Occupation: curator  Tobacco Use   Smoking status: Never   Smokeless tobacco: Current    Types: Chew, Snuff    Last attempt to quit: 01/08/2017  Vaping Use   Vaping status: Never Used  Substance and Sexual Activity   Alcohol use: Yes    Comment: occassional   Drug use: No   Sexual activity: Yes    Birth control/protection: None  Other Topics Concern   Not on file   Social History Narrative   Not on file   Social Drivers of Health   Financial Resource Strain: Not on file  Food Insecurity: Not on file  Transportation Needs: Not on file  Physical Activity: Not on file  Stress: Not on file  Social Connections: Not on file    Family History  Problem Relation Age of Onset   Heart disease Unknown    Cancer Unknown    Diabetes Unknown    Cancer Mother    Heart disease Father    Colon cancer Neg Hx    Colon polyps Neg Hx    Esophageal cancer Neg Hx    Stomach cancer Neg Hx    Rectal cancer Neg Hx     Outpatient Encounter Medications as of 04/28/2024  Medication Sig   Accu-Chek Softclix Lancets lancets Use to check sugars every day   albuterol  (VENTOLIN  HFA) 108 (90 Base) MCG/ACT inhaler Inhale 2 puffs into the lungs every 6 (six) hours as needed for wheezing or shortness of breath.   ascorbic acid (VITAMIN C) 100 MG tablet Take 1 tablet by mouth daily.  Blood Glucose Monitoring Suppl (ACCU-CHEK AVIVA PLUS) w/Device KIT Use to check glucose twice daily   Cholecalciferol (VITAMIN D3) 25 MCG (1000 UT) CAPS Take 1,000 Units by mouth daily.   clobetasol (TEMOVATE) 0.05 % external solution Apply 1 Application topically.   desonide (DESOWEN) 0.05 % cream Apply 1 Application topically.   diclofenac  (VOLTAREN ) 75 MG EC tablet TAKE 1 TABLET(75 MG) BY MOUTH TWICE DAILY AS NEEDED FOR MODERATE PAIN   empagliflozin  (JARDIANCE ) 10 MG TABS tablet Take 1 tablet (10 mg total) by mouth daily.   fluticasone  (FLONASE ) 50 MCG/ACT nasal spray SHAKE LIQUID AND USE 2 SPRAYS IN EACH NOSTRIL DAILY   glipiZIDE  (GLUCOTROL  XL) 5 MG 24 hr tablet Take 2 tablets (10 mg total) by mouth daily with breakfast.   glucose blood (ACCU-CHEK GUIDE TEST) test strip Use as instructed to monitor glucose twice daily   Multiple Vitamin (MULTIVITAMIN WITH MINERALS) TABS tablet Take 1 tablet by mouth daily.   OVER THE COUNTER MEDICATION Take 1 capsule by mouth daily. Patient shares that  this is Beet Root   sitaGLIPtin  (JANUVIA ) 100 MG tablet Take 1 tablet (100 mg total) by mouth daily.   traZODone  (DESYREL ) 50 MG tablet TAKE 1 TABLET(50 MG) BY MOUTH AT BEDTIME AS NEEDED FOR SLEEP   Turmeric 400 MG CAPS Take by mouth.   No facility-administered encounter medications on file as of 04/28/2024.    ALLERGIES: Allergies  Allergen Reactions   Ozempic  (0.25 Or 0.5 Mg-Dose) [Semaglutide (0.25 Or 0.5mg -Dos)] Other (See Comments)    Patient started having GI issues, Gallbladder attack   Semaglutide  Other (See Comments)    Patient started having GI issues, Gallbladder attack    VACCINATION STATUS:  There is no immunization history on file for this patient.  Diabetes He presents for his follow-up diabetic visit. He has type 2 diabetes mellitus. Onset time: Diagnosed at approx age of 56. His disease course has been fluctuating. There are no hypoglycemic associated symptoms. Associated symptoms include fatigue and foot paresthesias. Pertinent negatives for diabetes include no polydipsia and no polyuria. There are no hypoglycemic complications. Symptoms are stable. Diabetic complications include nephropathy. Risk factors for coronary artery disease include diabetes mellitus, dyslipidemia, hypertension and male sex. Current diabetic treatment includes oral agent (dual therapy). His weight is increasing steadily. He is following a generally healthy diet. When asked about meal planning, he reported none. He has not had a previous visit with a dietitian. He participates in exercise intermittently. His home blood glucose trend is fluctuating dramatically. His breakfast blood glucose range is generally 140-180 mg/dl. (He presents today with his logs showing above drastically fluctuating glycemic profile.  His POCT A1c today is 10%, increasing from last visit of 9.2%.  He did have glucose in the 300s, most recently managed in the 130-150 range.) An ACE inhibitor/angiotensin II receptor blocker is not  being taken. He does not see a podiatrist.Eye exam is current.     Review of systems  Constitutional: + increasing body weight,  current Body mass index is 32.3 kg/m. , no fatigue, no subjective hyperthermia, no subjective hypothermia Eyes: no blurry vision, no xerophthalmia ENT: no sore throat, no nodules palpated in throat, no dysphagia/odynophagia, no hoarseness Cardiovascular: no chest pain, no shortness of breath, no palpitations, no leg swelling Respiratory: no cough, no shortness of breath Gastrointestinal: no nausea/vomiting/diarrhea Musculoskeletal: + bilateral knee pain-may need surgery in coming months Skin: no rashes, no hyperemia, + hair loss-improving somewhat Neurological: no tremors, no numbness, no tingling, no dizziness Psychiatric:  no depression, no anxiety, + increased stress  Objective:     BP 122/78 (BP Location: Left Arm, Patient Position: Sitting, Cuff Size: Large)   Pulse 71   Ht 6' 2 (1.88 m)   Wt 251 lb 9.6 oz (114.1 kg)   BMI 32.30 kg/m   Wt Readings from Last 3 Encounters:  04/28/24 251 lb 9.6 oz (114.1 kg)  03/01/24 250 lb (113.4 kg)  01/21/24 246 lb 12.8 oz (111.9 kg)     BP Readings from Last 3 Encounters:  04/28/24 122/78  03/01/24 (!) 149/84  01/21/24 130/80      Physical Exam- Limited  Constitutional:  Body mass index is 32.3 kg/m. , not in acute distress, normal state of mind Eyes:  EOMI, no exophthalmos Musculoskeletal: no gross deformities, strength intact in all four extremities, no gross restriction of joint movements Skin:  no rashes, no hyperemia, + patchy hair loss to head and face (missing his right eyebrow- now growing back somewhat)  Neurological: no tremor with outstretched hands  Diabetic Foot Exam - Simple   No data filed     CMP ( most recent) CMP     Component Value Date/Time   NA 137 05/08/2023 1234   K 4.4 05/08/2023 1234   CL 98 05/08/2023 1234   CO2 22 05/08/2023 1234   GLUCOSE 289 (H) 05/08/2023  1234   GLUCOSE 154 (H) 01/08/2018 1357   BUN 15 05/08/2023 1234   CREATININE 1.04 05/08/2023 1234   CALCIUM  9.7 05/08/2023 1234   PROT 6.9 05/08/2023 1234   ALBUMIN 4.6 05/08/2023 1234   AST 12 05/08/2023 1234   ALT 17 05/08/2023 1234   ALKPHOS 98 05/08/2023 1234   BILITOT 1.0 05/08/2023 1234   GFRNONAA 79 09/27/2019 0858   GFRAA 91 09/27/2019 0858     Diabetic Labs (most recent): Lab Results  Component Value Date   HGBA1C 10.0 (A) 04/28/2024   HGBA1C 9.2 (A) 01/21/2024   HGBA1C 10.5 (A) 10/03/2023   MICROALBUR 30 09/10/2021     Lipid Panel ( most recent) Lipid Panel     Component Value Date/Time   CHOL 272 (H) 05/08/2023 1234   TRIG 280 (H) 05/08/2023 1234   HDL 48 05/08/2023 1234   CHOLHDL 5.7 (H) 05/08/2023 1234   LDLCALC 171 (H) 05/08/2023 1234   LABVLDL 53 (H) 05/08/2023 1234      Lab Results  Component Value Date   TSH 2.240 05/08/2023   FREET4 1.19 05/08/2023           Assessment & Plan:   1) Type 2 diabetes mellitus with hyperglycemia, without long-term current use of insulin (HCC)  He presents today with his logs showing above drastically fluctuating glycemic profile.  His POCT A1c today is 10%, increasing from last visit of 9.2%.  He did have glucose in the 300s, most recently managed in the 130-150 range.  Paul Barnes has currently uncontrolled symptomatic type 2 DM since 58 years of age.   -Recent labs reviewed.  - I had a long discussion with him about the progressive nature of diabetes and the pathology behind its complications. -his diabetes is complicated by mild CKD and neuropathy and he remains at a high risk for more acute and chronic complications which include CAD, CVA, CKD, retinopathy, and neuropathy. These are all discussed in detail with him.  - Nutritional counseling repeated/built upon at each appointment.  - The patient admits there is a room for improvement in their diet and  drink choices. -  Suggestion is made for  the patient to avoid simple carbohydrates from their diet including Cakes, Sweet Desserts / Pastries, Ice Cream, Soda (diet and regular), Sweet Tea, Candies, Chips, Cookies, Sweet Pastries, Store Bought Juices, Alcohol in Excess of 1-2 drinks a day, Artificial Sweeteners, Coffee Creamer, and Sugar-free Products. This will help patient to have stable blood glucose profile and potentially avoid unintended weight gain.   - I encouraged the patient to switch to unprocessed or minimally processed complex starch and increased protein intake (animal or plant source), fruits, and vegetables.   - Patient is advised to stick to a routine mealtimes to eat 3 meals a day and avoid unnecessary snacks (to snack only to correct hypoglycemia).  - he will be scheduled with Penny Crumpton, RDN, CDE for diabetes education.  - I have approached him with the following individualized plan to manage his diabetes and patient agrees:   -He is advised to continue Januvia  100 mg po daily and continue Glipizide  10 mg XL daily at breakfast.  Will trial him on Jardiance  10 mg po daily to help keep simple regimen.  We did talk about potential for needing basal insulin to help regain and maintain control of diabetes.   He is encouraged to reach out if the Jardiance  is not affordable.  We did go over possible side effects of this medication.    He did ask about possibly retrying Ozempic .  He had gallbladder attack with this medication previously, so cautious about trying this again.  -he is advised to monitor glucose once daily, before breakfast and any other time if experiencing symptoms and to call the clinic if he has readings less than 70 or above 300 for 3 tests in a row.  He promises to reach out if glucose is not better within the next several weeks so we can start insulin.  - Adjustment parameters are given to him for hypo and hyperglycemia in writing.  - Specific targets for  A1c; LDL, HDL, and Triglycerides were  discussed with the patient.  2) Blood Pressure /Hypertension:  his blood pressure is controlled to target without the use of antihypertensive medications.  He will be considered for low dose ACE/ARB on subsequent visits if BP is above 140/90 on 3 separate occasions.  3) Lipids/Hyperlipidemia:    Review of his recent lipid panel from 05/08/23 showed uncontrolled LDL at 171 (worsening) but greatly improved triglycerides of 280. He does report significant myalgias with his statin, therefore he hopes to be taken off it in the future with lifestyle changes.  4)  Weight/Diet:  his Body mass index is 32.3 kg/m.  -  clearly complicating his diabetes care.   he is a candidate for weight loss. I discussed with him the fact that loss of 5 - 10% of his  current body weight will have the most impact on his diabetes management.  Exercise, and detailed carbohydrates information provided  -  detailed on discharge instructions.  5) Chronic Care/Health Maintenance: -he is not on ACEI/ARB or Statin medications and is encouraged to initiate and continue to follow up with Ophthalmology, Dentist, Podiatrist at least yearly or according to recommendations, and advised to stay away from smoking. I have recommended yearly flu vaccine and pneumonia vaccine at least every 5 years; moderate intensity exercise for up to 150 minutes weekly; and sleep for at least 7 hours a day.  - he is advised to maintain close follow up with Cook, Jayce G, DO for  primary care needs, as well as his other providers for optimal and coordinated care.       I spent  47 minutes in the care of the patient today including review of labs from CMP, Lipids, Thyroid  Function, Hematology (current and previous including abstractions from other facilities); face-to-face time discussing  his blood glucose readings/logs, discussing hypoglycemia and hyperglycemia episodes and symptoms, medications doses, his options of short and long term treatment based on  the latest standards of care / guidelines;  discussion about incorporating lifestyle medicine;  and documenting the encounter. Risk reduction counseling performed per USPSTF guidelines to reduce obesity and cardiovascular risk factors.     Please refer to Patient Instructions for Blood Glucose Monitoring and Insulin/Medications Dosing Guide  in media tab for additional information. Please  also refer to  Patient Self Inventory in the Media  tab for reviewed elements of pertinent patient history.  Vinie LITTIE Barnes participated in the discussions, expressed understanding, and voiced agreement with the above plans.  All questions were answered to his satisfaction. he is encouraged to contact clinic should he have any questions or concerns prior to his return visit.     Follow up plan: - Return in about 4 months (around 08/26/2024) for Diabetes F/U with A1c in office, No previsit labs, Bring meter and logs.  Benton Rio, [facility] [address] [place], [place] [address] Phone: [phone] Fax: [phone]  04/28/2024, 11:10 AM

## 2024-06-28 ENCOUNTER — Encounter: Payer: Self-pay | Admitting: *Deleted

## 2024-06-28 NOTE — Progress Notes (Signed)
 Paul Barnes                                          MRN: [id number]   06/28/2024   The [facility] Quality Team Specialist reviewed this patient medical record for the purposes of chart review for care gap closure. The following were reviewed: chart review for care gap closure-glycemic status assessment.    [facility] Quality Team

## 2024-08-30 ENCOUNTER — Ambulatory Visit: Admitting: Nurse Practitioner
